# Patient Record
Sex: Female | Born: 1980 | Race: White | Hispanic: No | Marital: Married | Smoking: Never smoker
Health system: Southern US, Community
[De-identification: ages and names within clinical notes are randomized; demographics above are authoritative.]

## PROBLEM LIST (undated history)

## (undated) DIAGNOSIS — Z789 Other specified health status: Secondary | ICD-10-CM

## (undated) HISTORY — PX: WISDOM TOOTH EXTRACTION: SHX21

## (undated) HISTORY — DX: Other specified health status: Z78.9

---

## 2012-05-02 ENCOUNTER — Ambulatory Visit: Payer: BC Managed Care – PPO | Admitting: Gynecology

## 2012-05-02 VITALS — BP 118/64 | Wt 126.0 lb

## 2012-05-02 DIAGNOSIS — Z3400 Encounter for supervision of normal first pregnancy, unspecified trimester: Secondary | ICD-10-CM

## 2012-05-02 LAB — POCT URINE PREGNANCY: Preg Test, Ur: POSITIVE

## 2012-05-03 LAB — OBSTETRIC PANEL
Antibody Screen: NEGATIVE
Basophils Absolute: 0 10*3/uL (ref 0.0–0.1)
Basophils Relative: 1 % (ref 0–1)
HCT: 37.2 % (ref 36.0–46.0)
MCHC: 33.9 g/dL (ref 30.0–36.0)
Monocytes Absolute: 0.5 10*3/uL (ref 0.1–1.0)
Neutro Abs: 4.5 10*3/uL (ref 1.7–7.7)
Neutrophils Relative %: 64 % (ref 43–77)
RDW: 12.2 % (ref 11.5–15.5)

## 2012-05-03 LAB — HIV ANTIBODY (ROUTINE TESTING W REFLEX): HIV: NONREACTIVE

## 2012-05-04 LAB — CULTURE, URINE COMPREHENSIVE
Colony Count: NO GROWTH
Organism ID, Bacteria: NO GROWTH

## 2012-05-17 ENCOUNTER — Encounter: Payer: Self-pay | Admitting: Family Medicine

## 2012-05-17 ENCOUNTER — Ambulatory Visit (INDEPENDENT_AMBULATORY_CARE_PROVIDER_SITE_OTHER): Payer: BC Managed Care – PPO | Admitting: Family Medicine

## 2012-05-17 VITALS — BP 113/60 | Ht 66.0 in | Wt 129.0 lb

## 2012-05-17 DIAGNOSIS — Z1151 Encounter for screening for human papillomavirus (HPV): Secondary | ICD-10-CM

## 2012-05-17 DIAGNOSIS — Z23 Encounter for immunization: Secondary | ICD-10-CM

## 2012-05-17 DIAGNOSIS — Z113 Encounter for screening for infections with a predominantly sexual mode of transmission: Secondary | ICD-10-CM

## 2012-05-17 DIAGNOSIS — Z3400 Encounter for supervision of normal first pregnancy, unspecified trimester: Secondary | ICD-10-CM

## 2012-05-17 DIAGNOSIS — Z124 Encounter for screening for malignant neoplasm of cervix: Secondary | ICD-10-CM

## 2012-05-17 MED ORDER — INFLUENZA VIRUS VACC SPLIT PF IM SUSP: 0.5000 mL | Freq: Once | INTRAMUSCULAR | Status: DC

## 2012-05-17 NOTE — Patient Instructions (Signed)
Pregnancy - First Trimester During sexual intercourse, millions of sperm go into the vagina. Only 1 sperm will penetrate and fertilize the female egg while it is in the Fallopian tube. One week later, the fertilized egg implants into the wall of the uterus. An embryo begins to develop into a baby. At 6 to 8 weeks, the eyes and face are formed and the heartbeat can be seen on ultrasound. At the end of 12 weeks (first trimester), all the baby's organs are formed. Now that you are pregnant, you will want to do everything you can to have a healthy baby. Two of the most important things are to get good prenatal care and follow your caregiver's instructions. Prenatal care is all the medical care you receive before the baby's birth. It is given to prevent, find, and treat problems during the pregnancy and childbirth. PRENATAL EXAMS  During prenatal visits, your weight, blood pressure and urine are checked. This is done to make sure you are healthy and progressing normally during the pregnancy.  A pregnant woman should gain 25 to 35 pounds during the pregnancy. However, if you are over weight or underweight, your caregiver will advise you regarding your weight.  Your caregiver will ask and answer questions for you.  Blood work, cervical cultures, other necessary tests and a Pap test are done during your prenatal exams. These tests are done to check on your health and the probable health of your baby. Tests are strongly recommended and done for HIV with your permission. This is the virus that causes AIDS. These tests are done because medications can be given to help prevent your baby from being born with this infection should you have been infected without knowing it. Blood work is also used to find out your blood type, previous infections and follow your blood levels (hemoglobin).  Low hemoglobin (anemia) is common during pregnancy. Iron and vitamins are given to help prevent this. Later in the pregnancy,  blood tests for diabetes will be done along with any other tests if any problems develop. You may need tests to make sure you and the baby are doing well.  You may need other tests to make sure you and the baby are doing well. CHANGES DURING THE FIRST TRIMESTER (THE FIRST 3 MONTHS OF PREGNANCY) Your body goes through many changes during pregnancy. They vary from person to person. Talk to your caregiver about changes you notice and are concerned about. Changes can include:  Your menstrual period stops.  The egg and sperm carry the genes that determine what you look like. Genes from you and your partner are forming a baby. The female genes determine whether the baby is a boy or a girl.  Your body increases in girth and you may feel bloated.  Feeling sick to your stomach (nauseous) and throwing up (vomiting). If the vomiting is uncontrollable, call your caregiver.  Your breasts will begin to enlarge and become tender.  Your nipples may stick out more and become darker.  The need to urinate more. Painful urination may mean you have a bladder infection.  Tiring easily.  Loss of appetite.  Cravings for certain kinds of food.  At first, you may gain or lose a couple of pounds.  You may have changes in your emotions from day to day (excited to be pregnant or concerned something may go wrong with the pregnancy and baby).  You may have more vivid and strange dreams. HOME CARE INSTRUCTIONS   It is very important   to avoid all smoking, alcohol and un-prescribed drugs during your pregnancy. These affect the formation and growth of the baby. Avoid chemicals while pregnant to ensure the delivery of a healthy infant.  Start your prenatal visits by the 12th week of pregnancy. They are usually scheduled monthly at first, then more often in the last 2 months before delivery. Keep your caregiver's appointments. Follow your caregiver's instructions regarding medication use, blood and lab tests, exercise,  and diet.  During pregnancy, you are providing food for you and your baby. Eat regular, well-balanced meals. Choose foods such as meat, fish, milk and other low fat dairy products, vegetables, fruits, and whole-grain breads and cereals. Your caregiver will tell you of the ideal weight gain.  You can help morning sickness by keeping soda crackers at the bedside. Eat a couple before arising in the morning. You may want to use the crackers without salt on them.  Eating 4 to 5 small meals rather than 3 large meals a day also may help the nausea and vomiting.  Drinking liquids between meals instead of during meals also seems to help nausea and vomiting.  A physical sexual relationship may be continued throughout pregnancy if there are no other problems. Problems may be early (premature) leaking of amniotic fluid from the membranes, vaginal bleeding, or belly (abdominal) pain.  Exercise regularly if there are no restrictions. Check with your caregiver or physical therapist if you are unsure of the safety of some of your exercises. Greater weight gain will occur in the last 2 trimesters of pregnancy. Exercising will help:  Control your weight.  Keep you in shape.  Prepare you for labor and delivery.  Help you lose your pregnancy weight after you deliver your baby.  Wear a good support or jogging bra for breast tenderness during pregnancy. This may help if worn during sleep too.  Ask when prenatal classes are available. Begin classes when they are offered.  Do not use hot tubs, steam rooms or saunas.  Wear your seat belt when driving. This protects you and your baby if you are in an accident.  Avoid raw meat, uncooked cheese, cat litter boxes and soil used by cats throughout the pregnancy. These carry germs that can cause birth defects in the baby.  The first trimester is a good time to visit your dentist for your dental health. Getting your teeth cleaned is OK. Use a softer toothbrush and  brush gently during pregnancy.  Ask for help if you have financial, counseling or nutritional needs during pregnancy. Your caregiver will be able to offer counseling for these needs as well as refer you for other special needs.  Do not take any medications or herbs unless told by your caregiver.  Inform your caregiver if there is any mental or physical domestic violence.  Make a list of emergency phone numbers of family, friends, hospital, and police and fire departments.  Write down your questions. Take them to your prenatal visit.  Do not douche.  Do not cross your legs.  If you have to stand for long periods of time, rotate you feet or take small steps in a circle.  You may have more vaginal secretions that may require a sanitary pad. Do not use tampons or scented sanitary pads. MEDICATIONS AND DRUG USE IN PREGNANCY  Take prenatal vitamins as directed. The vitamin should contain 1 milligram of folic acid. Keep all vitamins out of reach of children. Only a couple vitamins or tablets containing iron may be   fatal to a baby or young child when ingested.  Avoid use of all medications, including herbs, over-the-counter medications, not prescribed or suggested by your caregiver. Only take over-the-counter or prescription medicines for pain, discomfort, or fever as directed by your caregiver. Do not use aspirin, ibuprofen, or naproxen unless directed by your caregiver.  Let your caregiver also know about herbs you may be using.  Alcohol is related to a number of birth defects. This includes fetal alcohol syndrome. All alcohol, in any form, should be avoided completely. Smoking will cause low birth rate and premature babies.  Street or illegal drugs are very harmful to the baby. They are absolutely forbidden. A baby born to an addicted mother will be addicted at birth. The baby will go through the same withdrawal an adult does.  Let your caregiver know about any medications that you have to  take and for what reason you take them. MISCARRIAGE IS COMMON DURING PREGNANCY A miscarriage does not mean you did something wrong. It is not a reason to worry about getting pregnant again. Your caregiver will help you with questions you may have. If you have a miscarriage, you may need minor surgery. SEEK MEDICAL CARE IF:  You have any concerns or worries during your pregnancy. It is better to call with your questions if you feel they cannot wait, rather than worry about them. SEEK IMMEDIATE MEDICAL CARE IF:   An unexplained oral temperature above 102 F (38.9 C) develops, or as your caregiver suggests.  You have leaking of fluid from the vagina (birth canal). If leaking membranes are suspected, take your temperature and inform your caregiver of this when you call.  There is vaginal spotting or bleeding. Notify your caregiver of the amount and how many pads are used.  You develop a bad smelling vaginal discharge with a change in the color.  You continue to feel sick to your stomach (nauseated) and have no relief from remedies suggested. You vomit blood or coffee ground-like materials.  You lose more than 2 pounds of weight in 1 week.  You gain more than 2 pounds of weight in 1 week and you notice swelling of your face, hands, feet, or legs.  You gain 5 pounds or more in 1 week (even if you do not have swelling of your hands, face, legs, or feet).  You get exposed to German measles and have never had them.  You are exposed to fifth disease or chickenpox.  You develop belly (abdominal) pain. Round ligament discomfort is a common non-cancerous (benign) cause of abdominal pain in pregnancy. Your caregiver still must evaluate this.  You develop headache, fever, diarrhea, pain with urination, or shortness of breath.  You fall or are in a car accident or have any kind of trauma.  There is mental or physical violence in your home. Document Released: 05/03/2001 Document Revised: 08/01/2011  Document Reviewed: 11/04/2008 [employer] Patient Information 2013 [employer].  Breastfeeding Deciding to breastfeed is one of the best choices you can make for you and your baby. The information that follows gives a brief overview of the benefits of breastfeeding as well as common topics surrounding breastfeeding. BENEFITS OF BREASTFEEDING For the baby  The first milk (colostrum) helps the baby's digestive system function better.   There are antibodies in the mother's milk that help the baby fight off infections.   The baby has a lower incidence of asthma, allergies, and sudden infant death syndrome (SIDS).   The nutrients in breast milk   are better for the baby than infant formulas, and breast milk helps the baby's brain grow better.   Babies who breastfeed have less gas, colic, and constipation.  For the mother  Breastfeeding helps develop a very special bond between the mother and her baby.   Breastfeeding is convenient, always available at the correct temperature, and costs nothing.   Breastfeeding burns calories in the mother and helps her lose weight that was gained during pregnancy.   Breastfeeding makes the uterus contract back down to normal size faster and slows bleeding following delivery.   Breastfeeding mothers have a lower risk of developing breast cancer.  BREASTFEEDING FREQUENCY  A healthy, full-term baby may breastfeed as often as every hour or space his or her feedings to every 3 hours.   Watch your baby for signs of hunger. Nurse your baby if he or she shows signs of hunger. How often you nurse will vary from baby to baby.   Nurse as often as the baby requests, or when you feel the need to reduce the fullness of your breasts.   Awaken the baby if it has been 3 4 hours since the last feeding.   Frequent feeding will help the mother make more milk and will help prevent problems, such as sore nipples and engorgement of the breasts.  BABY'S  POSITION AT THE BREAST  Whether lying down or sitting, be sure that the baby's tummy is facing your tummy.   Support the breast with 4 fingers underneath the breast and the thumb above. Make sure your fingers are well away from the nipple and baby's mouth.   Stroke the baby's lips gently with your finger or nipple.   When the baby's mouth is open wide enough, place all of your nipple and as much of the areola as possible into your baby's mouth.   Pull the baby in close so the tip of the nose and the baby's cheeks touch the breast during the feeding.  FEEDINGS AND SUCTION  The length of each feeding varies from baby to baby and from feeding to feeding.   The baby must suck about 2 3 minutes for your milk to get to him or her. This is called a "let down." For this reason, allow the baby to feed on each breast as long as he or she wants. Your baby will end the feeding when he or she has received the right balance of nutrients.   To break the suction, put your finger into the corner of the baby's mouth and slide it between his or her gums before removing your breast from his or her mouth. This will help prevent sore nipples.  HOW TO TELL WHETHER YOUR BABY IS GETTING ENOUGH BREAST MILK. Wondering whether or not your baby is getting enough milk is a common concern among mothers. You can be assured that your baby is getting enough milk if:   Your baby is actively sucking and you hear swallowing.   Your baby seems relaxed and satisfied after a feeding.   Your baby nurses at least 8 12 times in a 24 hour time period. Nurse your baby until he or she unlatches or falls asleep at the first breast (at least 10 20 minutes), then offer the second side.   Your baby is wetting 5 6 disposable diapers (6 8 cloth diapers) in a 24 hour period by 5 6 days of age.   Your baby is having at least 3 4 stools every 24 hours   for the first 6 weeks. The stool should be soft and yellow.   Your baby  should gain 4 7 ounces per week after he or she is 4 days old.   Your breasts feel softer after nursing.  REDUCING BREAST ENGORGEMENT  In the first week after your baby is born, you may experience signs of breast engorgement. When breasts are engorged, they feel heavy, warm, full, and may be tender to the touch. You can reduce engorgement if you:   Nurse frequently, every 2 3 hours. Mothers who breastfeed early and often have fewer problems with engorgement.   Place light ice packs on your breasts for 10 20 minutes between feedings. This reduces swelling. Wrap the ice packs in a lightweight towel to protect your skin. Bags of frozen vegetables work well for this purpose.   Take a warm shower or apply warm, moist heat to your breast for 5 10 minutes just before each feeding. This increases circulation and helps the milk flow.   Gently massage your breast before and during the feeding. Using your finger tips, massage from the chest wall towards your nipple in a circular motion.   Make sure that the baby empties at least one breast at every feeding before switching sides.   Use a breast pump to empty the breasts if your baby is sleepy or not nursing well. You may also want to pump if you are returning to work oryou feel you are getting engorged.   Avoid bottle feeds, pacifiers, or supplemental feedings of water or juice in place of breastfeeding. Breast milk is all the food your baby needs. It is not necessary for your baby to have water or formula. In fact, to help your breasts make more milk, it is best not to give your baby supplemental feedings during the early weeks.   Be sure the baby is latched on and positioned properly while breastfeeding.   Wear a supportive bra, avoiding underwire styles.   Eat a balanced diet with enough fluids.   Rest often, relax, and take your prenatal vitamins to prevent fatigue, stress, and anemia.  If you follow these suggestions, your  engorgement should improve in 24 48 hours. If you are still experiencing difficulty, call your lactation consultant or caregiver.  CARING FOR YOURSELF Take care of your breasts  Bathe or shower daily.   Avoid using soap on your nipples.   Start feedings on your left breast at one feeding and on your right breast at the next feeding.   You will notice an increase in your milk supply 2 5 days after delivery. You may feel some discomfort from engorgement, which makes your breasts very firm and often tender. Engorgement "peaks" out within 24 48 hours. In the meantime, apply warm moist towels to your breasts for 5 10 minutes before feeding. Gentle massage and expression of some milk before feeding will soften your breasts, making it easier for your baby to latch on.   Wear a well-fitting nursing bra, and air dry your nipples for a 3 4minutes after each feeding.   Only use cotton bra pads.   Only use pure lanolin on your nipples after nursing. You do not need to wash it off before feeding the baby again. Another option is to express a few drops of breast milk and gently massage it into your nipples.  Take care of yourself  Eat well-balanced meals and nutritious snacks.   Drinking milk, fruit juice, and water to satisfy your thirst (  about 8 glasses a day).   Get plenty of rest.  Avoid foods that you notice affect the baby in a bad way.  SEEK MEDICAL CARE IF:   You have difficulty with breastfeeding and need help.   You have a hard, red, sore area on your breast that is accompanied by a fever.   Your baby is too sleepy to eat well or is having trouble sleeping.   Your baby is wetting less than 6 diapers a day, by 5 days of age.   Your baby's skin or white part of his or her eyes is more yellow than it was in the hospital.   You feel depressed.  Document Released: 05/09/2005 Document Revised: 11/08/2011 Document Reviewed: 08/07/2011 [employer] Patient Information 2013  [employer].  

## 2012-05-17 NOTE — Progress Notes (Signed)
   Subjective:    Enya Bureau is a G1P0 [redacted]w[redacted]d being seen today for her first obstetrical visit.  Her obstetrical history is significant for no pertinent history.. Patient does intend to breast feed. Pregnancy history fully reviewed.  Patient reports nausea.  Filed Vitals:   05/17/12 0841 05/17/12 0843  BP: 113/60   Height:  5\' 6"  (1.676 m)  Weight: 129 lb (58.514 kg)     HISTORY: OB History    Grav Para Term Preterm Abortions TAB SAB Ect Mult Living   1              # Outc Date GA Lbr Len/2nd Wgt Sex Del Anes PTL Lv   1 CUR              History reviewed. No pertinent past medical history. Past Surgical History  Procedure Date  . Wisdom tooth extraction     x3   Family History  Problem Relation Age of Onset  . Heart disease Father     quad bypass surgery  . Cancer Paternal Aunt     pancreatic  . Cancer Maternal Grandmother     breast  . Cancer Maternal Grandfather     lung  . Cancer Paternal Grandmother     breast  . Cancer Paternal Grandfather   . Cancer Paternal Aunt      Exam    Uterus:     Pelvic Exam:    Perineum: Normal Perineum   Vulva: Bartholin's, Urethra, Skene's normal   Vagina:  normal mucosa       Cervix: no cervical motion tenderness, no lesions and nulliparous appearance   Adnexa: normal adnexa and no mass, fullness, tenderness   Bony Pelvis: average  System: Breast:  normal appearance, no masses or tenderness   Skin: normal coloration and turgor, no rashes    Neurologic: oriented   Extremities: normal strength, tone, and muscle mass   HEENT sclera clear, anicteric   Mouth/Teeth mucous membranes moist, pharynx normal without lesions   Neck supple   Cardiovascular: regular rate and rhythm, no murmurs or gallops   Respiratory:  appears well, vitals normal, no respiratory distress, acyanotic, normal RR, ear and throat exam is normal, neck free of mass or lymphadenopathy, chest clear, no wheezing, crepitations, rhonchi, normal symmetric  air entry   Abdomen: soft, non-tender; bowel sounds normal; no masses,  no organomegaly       TVUS with monitor broken--small screen shows single IUP + flicker.   Assessment:    Pregnancy: G1P0 Patient Active Problem List  Diagnosis  . Supervision of normal first pregnancy        Plan:     Initial labs drawn. Prenatal vitamins. Problem list reviewed and updated. Genetic Screening discussed First Screen: declined.  Ultrasound discussed; fetal survey: discussed.  Follow up in 4 weeks.  Logistics of  [facility] discussed.   PRATT,TANYA S 05/17/2012

## 2012-06-15 ENCOUNTER — Encounter: Payer: Self-pay | Admitting: Family Medicine

## 2012-06-15 ENCOUNTER — Encounter: Payer: BC Managed Care – PPO | Admitting: Family Medicine

## 2012-06-15 ENCOUNTER — Ambulatory Visit (INDEPENDENT_AMBULATORY_CARE_PROVIDER_SITE_OTHER): Payer: BC Managed Care – PPO | Admitting: Family Medicine

## 2012-06-15 ENCOUNTER — Ambulatory Visit (HOSPITAL_COMMUNITY)
Admission: RE | Admit: 2012-06-15 | Discharge: 2012-06-15 | Disposition: A | Discharge status: Home / Self Care | Payer: BC Managed Care – PPO | Source: Ambulatory Visit | Attending: Family Medicine | Admitting: Family Medicine

## 2012-06-15 VITALS — BP 115/60 | Wt 131.0 lb

## 2012-06-15 DIAGNOSIS — O200 Threatened abortion: Secondary | ICD-10-CM | POA: Insufficient documentation

## 2012-06-15 DIAGNOSIS — O209 Hemorrhage in early pregnancy, unspecified: Secondary | ICD-10-CM | POA: Insufficient documentation

## 2012-06-15 DIAGNOSIS — Z3480 Encounter for supervision of other normal pregnancy, unspecified trimester: Secondary | ICD-10-CM

## 2012-06-15 DIAGNOSIS — O26859 Spotting complicating pregnancy, unspecified trimester: Secondary | ICD-10-CM

## 2012-06-15 NOTE — Patient Instructions (Signed)
Threatened Miscarriage Bleeding during the first 20 weeks of pregnancy is common. This is sometimes called a threatened miscarriage. This is a pregnancy that is threatening to end before the twentieth week of pregnancy. Often this bleeding stops with bed rest or decreased activities as suggested by your caregiver and the pregnancy continues without any more problems. You may be asked to not have sexual intercourse, have orgasms or use tampons until further notice. Sometimes a threatened miscarriage can progress to a complete or incomplete miscarriage. This may or may not require further treatment. Some miscarriages occur before a woman misses a menstrual period and knows she is pregnant. Miscarriages occur in 43 to 20% of all pregnancies and usually occur during the first 13 weeks of the pregnancy. The exact cause of a miscarriage is usually never known. A miscarriage is natures way of ending a pregnancy that is abnormal or would not make it to term. There are some things that may put you at risk to have a miscarriage, such as:  Hormone problems.  Infection of the uterus or cervix.  Chronic illness, diabetes for example, especially if it is not controlled.  Abnormal shaped uterus.  Fibroids in the uterus.  Incompetent cervix (the cervix is too weak to hold the baby).  Smoking.  Drinking too much alcohol. It's best not to drink any alcohol when you are pregnant.  Taking illegal drugs. TREATMENT  When a miscarriage becomes complete and all products of conception (all the tissue in the uterus) have been passed, often no treatment is needed. If you think you passed tissue, save it in a container and take it to your doctor for evaluation. If the miscarriage is incomplete (parts of the fetus or placenta remain in the uterus), further treatment may be needed. The most common reason for further treatment is continued bleeding (hemorrhage) because pregnancy tissue did not pass out of the uterus. This  often occurs if a miscarriage is incomplete. Tissue left behind may also become infected. Treatment usually is dilatation and curettage (the removal of the remaining products of pregnancy. This can be done by a simple sucking procedure (suction curettage) or a simple scraping of the inside of the uterus. This may be done in the hospital or in the caregiver's office. This is only done when your caregiver knows that there is no chance for the pregnancy to proceed to term. This is determined by physical examination, negative pregnancy test, falling pregnancy hormone count and/or, an ultrasound revealing a dead fetus. Miscarriages are often a very emotional time for prospective mothers and fathers. This is not you or your partners fault. It did not occur because of an inadequacy in you or your partner. Nearly all miscarriages occur because the pregnancy has started off wrongly. At least half of these pregnancies have a chromosomal abnormality. It is almost always not inherited. Others may have developmental problems with the fetus or placenta. This does not always show up even when the products miscarried are studied under the microscope. The miscarriage is nearly always not your fault and it is not likely that you could have prevented it from happening. If you are having emotional and grieving problems, talk to your health care provider and even seek counseling, if necessary, before getting pregnant again. You can begin trying for another pregnancy as soon as your caregiver says it is OK. HOME CARE INSTRUCTIONS   Your caregiver may order bed rest depending on how much bleeding and cramping you are having. You may be limited  caregiver may order bed rest depending on how much bleeding and cramping you are having. You may be limited to only getting up to go to the bathroom. You may be allowed to continue light activity. You may need to make arrangements for the care of your other children and for any other responsibilities.   Keep track of the number of pads you use each day, how often you have to change pads and how saturated  (soaked) they are. Record this information.   DO NOT USE TAMPONS. Do not douche, have sexual intercourse or orgasms until approved by your caregiver.   You may receive a follow up appointment for re-evaluation of your pregnancy and a repeat blood test. Re-evaluation often occurs after 2 days and again in 4 to 6 weeks. It is very important that you follow-up in the recommended time period.   If you are Rh negative and the father is Rh positive or you do not know the fathers' blood type, you may receive a shot (Rh immune globulin) to help prevent abnormal antibodies that can develop and affect the baby in any future pregnancies.  SEEK IMMEDIATE MEDICAL CARE IF:   You have severe cramps in your stomach, back, or abdomen.   You have a sudden onset of severe pain in the lower part of your abdomen.   You develop chills.   You run an unexplained temperature of 101 F (38.3 C) or higher.   You pass large clots or tissue. Save any tissue for your caregiver to inspect.   Your bleeding increases or you become light-headed, weak, or have fainting episodes.   You have a gush of fluid from your vagina.   You pass out. This could mean you have a tubal (ectopic) pregnancy.  Document Released: 05/09/2005 Document Revised: 08/01/2011 Document Reviewed: 12/24/2007   [employer] Patient Information 2013 [employer]

## 2012-06-15 NOTE — Progress Notes (Signed)
Had some cramping and spotting. Has nml heartbeat today U/S shows subchorionic hemorrhage.  Called and emailed result to pt.

## 2012-06-19 ENCOUNTER — Encounter: Payer: BC Managed Care – PPO | Admitting: Obstetrics & Gynecology

## 2012-07-09 ENCOUNTER — Ambulatory Visit (INDEPENDENT_AMBULATORY_CARE_PROVIDER_SITE_OTHER): Payer: BC Managed Care – PPO | Admitting: Obstetrics & Gynecology

## 2012-07-09 ENCOUNTER — Encounter: Payer: Self-pay | Admitting: Obstetrics & Gynecology

## 2012-07-09 VITALS — BP 119/64 | Wt 135.0 lb

## 2012-07-09 DIAGNOSIS — Z3400 Encounter for supervision of normal first pregnancy, unspecified trimester: Secondary | ICD-10-CM

## 2012-07-09 NOTE — Progress Notes (Signed)
Bleeding had stopped until yesterday, having small amount of cramping otherwise doing well.

## 2012-07-09 NOTE — Progress Notes (Signed)
Routine visit. She stopped vaginal spotting on 06/15/11 and had another occasion of spotting yesterday and today. Her cervix appears normal today with a speculum exam. Reassurance given.

## 2012-07-10 ENCOUNTER — Encounter: Payer: BC Managed Care – PPO | Admitting: Family Medicine

## 2012-08-06 ENCOUNTER — Encounter: Payer: BC Managed Care – PPO | Admitting: Obstetrics & Gynecology

## 2012-08-13 ENCOUNTER — Ambulatory Visit (INDEPENDENT_AMBULATORY_CARE_PROVIDER_SITE_OTHER): Payer: BC Managed Care – PPO | Admitting: Obstetrics & Gynecology

## 2012-08-13 VITALS — BP 126/64 | Wt 141.0 lb

## 2012-08-13 DIAGNOSIS — Z3402 Encounter for supervision of normal first pregnancy, second trimester: Secondary | ICD-10-CM

## 2012-08-13 DIAGNOSIS — Z3400 Encounter for supervision of normal first pregnancy, unspecified trimester: Secondary | ICD-10-CM

## 2012-08-13 NOTE — Progress Notes (Signed)
Routine visit. Good FM. Her anatomy ultrasound is tomorrow. She has had her flu vaccine. She denies any further bleeding.

## 2012-08-14 ENCOUNTER — Ambulatory Visit (HOSPITAL_COMMUNITY)
Admission: RE | Admit: 2012-08-14 | Discharge: 2012-08-14 | Disposition: A | Discharge status: Home / Self Care | Payer: BC Managed Care – PPO | Source: Ambulatory Visit | Attending: Obstetrics & Gynecology | Admitting: Obstetrics & Gynecology

## 2012-08-14 DIAGNOSIS — Z363 Encounter for antenatal screening for malformations: Secondary | ICD-10-CM | POA: Insufficient documentation

## 2012-08-14 DIAGNOSIS — Z1389 Encounter for screening for other disorder: Secondary | ICD-10-CM | POA: Insufficient documentation

## 2012-08-14 DIAGNOSIS — Z3400 Encounter for supervision of normal first pregnancy, unspecified trimester: Secondary | ICD-10-CM

## 2012-08-14 DIAGNOSIS — O358XX0 Maternal care for other (suspected) fetal abnormality and damage, not applicable or unspecified: Secondary | ICD-10-CM | POA: Insufficient documentation

## 2012-08-15 ENCOUNTER — Encounter: Payer: Self-pay | Admitting: Obstetrics & Gynecology

## 2012-09-10 ENCOUNTER — Encounter: Payer: Self-pay | Admitting: *Deleted

## 2012-09-10 ENCOUNTER — Ambulatory Visit (INDEPENDENT_AMBULATORY_CARE_PROVIDER_SITE_OTHER): Payer: BC Managed Care – PPO | Admitting: Obstetrics & Gynecology

## 2012-09-10 VITALS — BP 116/64 | Wt 147.0 lb

## 2012-09-10 DIAGNOSIS — O358XX0 Maternal care for other (suspected) fetal abnormality and damage, not applicable or unspecified: Secondary | ICD-10-CM | POA: Insufficient documentation

## 2012-09-10 DIAGNOSIS — O289 Unspecified abnormal findings on antenatal screening of mother: Secondary | ICD-10-CM

## 2012-09-10 DIAGNOSIS — Z3400 Encounter for supervision of normal first pregnancy, unspecified trimester: Secondary | ICD-10-CM

## 2012-09-10 NOTE — Progress Notes (Signed)
P= 63 

## 2012-09-10 NOTE — Progress Notes (Signed)
Routine visit. Good FM. No problems, no VB, ROM, or CTXs. Glucola, labs, and TDAP at next visit.

## 2012-09-28 ENCOUNTER — Ambulatory Visit (INDEPENDENT_AMBULATORY_CARE_PROVIDER_SITE_OTHER): Payer: BC Managed Care – PPO | Admitting: Obstetrics & Gynecology

## 2012-09-28 ENCOUNTER — Encounter: Payer: Self-pay | Admitting: Obstetrics & Gynecology

## 2012-09-28 VITALS — BP 137/69 | Wt 149.0 lb

## 2012-09-28 DIAGNOSIS — Z23 Encounter for immunization: Secondary | ICD-10-CM

## 2012-09-28 DIAGNOSIS — Z3402 Encounter for supervision of normal first pregnancy, second trimester: Secondary | ICD-10-CM

## 2012-09-28 DIAGNOSIS — Z3400 Encounter for supervision of normal first pregnancy, unspecified trimester: Secondary | ICD-10-CM

## 2012-09-28 LAB — CBC
MCV: 93.5 fL (ref 78.0–100.0)
Platelets: 264 10*3/uL (ref 150–400)
RBC: 3.55 MIL/uL — ABNORMAL LOW (ref 3.87–5.11)
RDW: 13.3 % (ref 11.5–15.5)
WBC: 11.9 10*3/uL — ABNORMAL HIGH (ref 4.0–10.5)

## 2012-09-28 NOTE — Progress Notes (Signed)
Routine visit. Good FM. No OB problems. TDAP, glucola, and labs today.

## 2012-09-29 LAB — GLUCOSE TOLERANCE, 1 HOUR (50G) W/O FASTING: Glucose, 1 Hour GTT: 92 mg/dL (ref 70–140)

## 2012-09-29 LAB — RPR

## 2012-10-08 ENCOUNTER — Encounter: Payer: Self-pay | Admitting: Obstetrics & Gynecology

## 2012-10-22 ENCOUNTER — Encounter: Payer: Self-pay | Admitting: Obstetrics & Gynecology

## 2012-10-22 ENCOUNTER — Ambulatory Visit (INDEPENDENT_AMBULATORY_CARE_PROVIDER_SITE_OTHER): Payer: BC Managed Care – PPO | Admitting: Obstetrics & Gynecology

## 2012-10-22 VITALS — BP 120/62 | Wt 153.0 lb

## 2012-10-22 DIAGNOSIS — Z3402 Encounter for supervision of normal first pregnancy, second trimester: Secondary | ICD-10-CM

## 2012-10-22 DIAGNOSIS — Z3400 Encounter for supervision of normal first pregnancy, unspecified trimester: Secondary | ICD-10-CM

## 2012-10-22 NOTE — Progress Notes (Signed)
Routine visit. Good FM. No VB, ROM, CTXs. No concerns or questions.

## 2012-11-05 ENCOUNTER — Encounter: Payer: Self-pay | Admitting: Obstetrics and Gynecology

## 2012-11-05 ENCOUNTER — Ambulatory Visit (INDEPENDENT_AMBULATORY_CARE_PROVIDER_SITE_OTHER): Payer: BC Managed Care – PPO | Admitting: Obstetrics and Gynecology

## 2012-11-05 VITALS — BP 135/92 | Wt 154.0 lb

## 2012-11-05 DIAGNOSIS — Z3403 Encounter for supervision of normal first pregnancy, third trimester: Secondary | ICD-10-CM

## 2012-11-05 DIAGNOSIS — O289 Unspecified abnormal findings on antenatal screening of mother: Secondary | ICD-10-CM

## 2012-11-05 DIAGNOSIS — O358XX0 Maternal care for other (suspected) fetal abnormality and damage, not applicable or unspecified: Secondary | ICD-10-CM

## 2012-11-05 DIAGNOSIS — Z3400 Encounter for supervision of normal first pregnancy, unspecified trimester: Secondary | ICD-10-CM

## 2012-11-05 NOTE — Progress Notes (Signed)
P= 80 

## 2012-11-05 NOTE — Progress Notes (Signed)
Patient doing well without complaints. FM/PTL precautions reviewed. F/U ultrasound for renal pyelectasis ordered

## 2012-11-09 ENCOUNTER — Ambulatory Visit (HOSPITAL_COMMUNITY)
Admission: RE | Admit: 2012-11-09 | Discharge: 2012-11-09 | Disposition: A | Discharge status: Home / Self Care | Payer: BC Managed Care – PPO | Source: Ambulatory Visit | Attending: Obstetrics and Gynecology | Admitting: Obstetrics and Gynecology

## 2012-11-09 DIAGNOSIS — Z363 Encounter for antenatal screening for malformations: Secondary | ICD-10-CM | POA: Insufficient documentation

## 2012-11-09 DIAGNOSIS — Z3403 Encounter for supervision of normal first pregnancy, third trimester: Secondary | ICD-10-CM

## 2012-11-09 DIAGNOSIS — Z1389 Encounter for screening for other disorder: Secondary | ICD-10-CM | POA: Insufficient documentation

## 2012-11-09 DIAGNOSIS — O358XX0 Maternal care for other (suspected) fetal abnormality and damage, not applicable or unspecified: Secondary | ICD-10-CM | POA: Insufficient documentation

## 2012-11-19 ENCOUNTER — Ambulatory Visit (INDEPENDENT_AMBULATORY_CARE_PROVIDER_SITE_OTHER): Payer: BC Managed Care – PPO | Admitting: Family Medicine

## 2012-11-19 ENCOUNTER — Encounter: Payer: Self-pay | Admitting: Family Medicine

## 2012-11-19 VITALS — BP 133/85 | Wt 157.0 lb

## 2012-11-19 DIAGNOSIS — Z3480 Encounter for supervision of other normal pregnancy, unspecified trimester: Secondary | ICD-10-CM

## 2012-11-19 DIAGNOSIS — O289 Unspecified abnormal findings on antenatal screening of mother: Secondary | ICD-10-CM

## 2012-11-19 DIAGNOSIS — O358XX0 Maternal care for other (suspected) fetal abnormality and damage, not applicable or unspecified: Secondary | ICD-10-CM

## 2012-11-19 NOTE — Progress Notes (Signed)
P = 66 

## 2012-11-19 NOTE — Progress Notes (Signed)
Doing well--U/S showed resolved fetal renal pyelectasis.

## 2012-11-19 NOTE — Patient Instructions (Signed)
Pregnancy - Third Trimester The third trimester of pregnancy (the last 3 months) is a period of the most rapid growth for you and your baby. The baby approaches a length of 20 inches and a weight of 6 to 10 pounds. The baby is adding on fat and getting ready for life outside your body. While inside, babies have periods of sleeping and waking, sucking thumbs, and hiccuping. You can often feel small contractions of the uterus. This is false labor. It is also called Braxton-Hicks contractions. This is like a practice for labor. The usual problems in this stage of pregnancy include more difficulty breathing, swelling of the hands and feet from water retention, and having to urinate more often because of the uterus and baby pressing on your bladder.  PRENATAL EXAMS  Blood work may continue to be done during prenatal exams. These tests are done to check on your health and the probable health of your baby. Blood work is used to follow your blood levels (hemoglobin). Anemia (low hemoglobin) is common during pregnancy. Iron and vitamins are given to help prevent this. You may also continue to be checked for diabetes. Some of the past blood tests may be done again.  The size of the uterus is measured during each visit. This makes sure your baby is growing properly according to your pregnancy dates.  Your blood pressure is checked every prenatal visit. This is to make sure you are not getting toxemia.  Your urine is checked every prenatal visit for infection, diabetes, and protein.  Your weight is checked at each visit. This is done to make sure gains are happening at the suggested rate and that you and your baby are growing normally.  Sometimes, an ultrasound is performed to confirm the position and the proper growth and development of the baby. This is a test done that bounces harmless sound waves off the baby so your caregiver can more accurately determine a due date.  Discuss the type of pain medicine and  anesthesia you will have during your labor and delivery.  Discuss the possibility and anesthesia if a cesarean section might be necessary.  Inform your caregiver if there is any mental or physical violence at home. Sometimes, a specialized non-stress test, contraction stress test, and biophysical profile are done to make sure the baby is not having a problem. Checking the amniotic fluid surrounding the baby is called an amniocentesis. The amniotic fluid is removed by sticking a needle into the belly (abdomen). This is sometimes done near the end of pregnancy if an early delivery is required. In this case, it is done to help make sure the baby's lungs are mature enough for the baby to live outside of the womb. If the lungs are not mature and it is unsafe to deliver the baby, an injection of cortisone medicine is given to the mother 1 to 2 days before the delivery. This helps the baby's lungs mature and makes it safer to deliver the baby. CHANGES OCCURING IN THE THIRD TRIMESTER OF PREGNANCY Your body goes through many changes during pregnancy. They vary from person to person. Talk to your caregiver about changes you notice and are concerned about.  During the last trimester, you have probably had an increase in your appetite. It is normal to have cravings for certain foods. This varies from person to person and pregnancy to pregnancy.  You may begin to get stretch marks on your hips, abdomen, and breasts. These are normal changes in the body   during pregnancy. There are no exercises or medicines to take which prevent this change.  Constipation may be treated with a stool softener or adding bulk to your diet. Drinking lots of fluids, fiber in vegetables, fruits, and whole grains are helpful.  Exercising is also helpful. If you have been very active up until your pregnancy, most of these activities can be continued during your pregnancy. If you have been less active, it is helpful to start an exercise  program such as walking. Consult your caregiver before starting exercise programs.  Avoid all smoking, alcohol, non-prescribed drugs, herbs and "street drugs" during your pregnancy. These chemicals affect the formation and growth of the baby. Avoid chemicals throughout the pregnancy to ensure the delivery of a healthy infant.  Backache, varicose veins, and hemorrhoids may develop or get worse.  You will tire more easily in the third trimester, which is normal.  The baby's movements may be stronger and more often.  You may become short of breath easily.  Your belly button may stick out.  A yellow discharge may leak from your breasts called colostrum.  You may have a bloody mucus discharge. This usually occurs a few days to a week before labor begins. HOME CARE INSTRUCTIONS   Keep your caregiver's appointments. Follow your caregiver's instructions regarding medicine use, exercise, and diet.  During pregnancy, you are providing food for you and your baby. Continue to eat regular, well-balanced meals. Choose foods such as meat, fish, milk and other low fat dairy products, vegetables, fruits, and whole-grain breads and cereals. Your caregiver will tell you of the ideal weight gain.  A physical sexual relationship may be continued throughout pregnancy if there are no other problems such as early (premature) leaking of amniotic fluid from the membranes, vaginal bleeding, or belly (abdominal) pain.  Exercise regularly if there are no restrictions. Check with your caregiver if you are unsure of the safety of your exercises. Greater weight gain will occur in the last 2 trimesters of pregnancy. Exercising helps:  Control your weight.  Get you in shape for labor and delivery.  You lose weight after you deliver.  Rest a lot with legs elevated, or as needed for leg cramps or low back pain.  Wear a good support or jogging bra for breast tenderness during pregnancy. This may help if worn during  sleep. Pads or tissues may be used in the bra if you are leaking colostrum.  Do not use hot tubs, steam rooms, or saunas.  Wear your seat belt when driving. This protects you and your baby if you are in an accident.  Avoid raw meat, cat litter boxes and soil used by cats. These carry germs that can cause birth defects in the baby.  It is easier to leak urine during pregnancy. Tightening up and strengthening the pelvic muscles will help with this problem. You can practice stopping your urination while you are going to the bathroom. These are the same muscles you need to strengthen. It is also the muscles you would use if you were trying to stop from passing gas. You can practice tightening these muscles up 10 times a set and repeating this about 3 times per day. Once you know what muscles to tighten up, do not perform these exercises during urination. It is more likely to cause an infection by backing up the urine.  Ask for help if you have financial, counseling, or nutritional needs during pregnancy. Your caregiver will be able to offer counseling for these   needs as well as refer you for other special needs.  Make a list of emergency phone numbers and have them available.  Plan on getting help from family or friends when you go home from the hospital.  Make a trial run to the hospital.  Take prenatal classes with the father to understand, practice, and ask questions about the labor and delivery.  Prepare the baby's room or nursery.  Do not travel out of the city unless it is absolutely necessary and with the advice of your caregiver.  Wear only low or no heal shoes to have better balance and prevent falling. MEDICINES AND DRUG USE IN PREGNANCY  Take prenatal vitamins as directed. The vitamin should contain 1 milligram of folic acid. Keep all vitamins out of reach of children. Only a couple vitamins or tablets containing iron may be fatal to a baby or young child when ingested.  Avoid use  of all medicines, including herbs, over-the-counter medicines, not prescribed or suggested by your caregiver. Only take over-the-counter or prescription medicines for pain, discomfort, or fever as directed by your caregiver. Do not use aspirin, ibuprofen or naproxen unless approved by your caregiver.  Let your caregiver also know about herbs you may be using.  Alcohol is related to a number of birth defects. This includes fetal alcohol syndrome. All alcohol, in any form, should be avoided completely. Smoking will cause low birth rate and premature babies.  Illegal drugs are very harmful to the baby. They are absolutely forbidden. A baby born to an addicted mother will be addicted at birth. The baby will go through the same withdrawal an adult does. SEEK MEDICAL CARE IF: You have any concerns or worries during your pregnancy. It is better to call with your questions if you feel they cannot wait, rather than worry about them. SEEK IMMEDIATE MEDICAL CARE IF:   An unexplained oral temperature above 102 F (38.9 C) develops, or as your caregiver suggests.  You have leaking of fluid from the vagina. If leaking membranes are suspected, take your temperature and tell your caregiver of this when you call.  There is vaginal spotting, bleeding or passing clots. Tell your caregiver of the amount and how many pads are used.  You develop a bad smelling vaginal discharge with a change in the color from clear to white.  You develop vomiting that lasts more than 24 hours.  You develop chills or fever.  You develop shortness of breath.  You develop burning on urination.  You loose more than 2 pounds of weight or gain more than 2 pounds of weight or as suggested by your caregiver.  You notice sudden swelling of your face, hands, and feet or legs.  You develop belly (abdominal) pain. Round ligament discomfort is a common non-cancerous (benign) cause of abdominal pain in pregnancy. Your caregiver still  must evaluate you.  You develop a severe headache that does not go away.  You develop visual problems, blurred or double vision.  If you have not felt your baby move for more than 1 hour. If you think the baby is not moving as much as usual, eat something with sugar in it and lie down on your left side for an hour. The baby should move at least 4 to 5 times per hour. Call right away if your baby moves less than that.  You fall, are in a car accident, or any kind of trauma.  There is mental or physical violence at home. Document Released: 05/03/2001   Document Revised: 02/01/2012 Document Reviewed: 11/05/2008 [employer] Patient Information 2014 [employer].  Breastfeeding A change in hormones during your pregnancy causes growth of your breast tissue and an increase in number and size of milk ducts. The hormone prolactin allows proteins, sugars, and fats from your blood supply to make breast milk in your milk-producing glands. The hormone progesterone prevents breast milk from being released before the birth of your baby. After the birth of your baby, your progesterone level decreases allowing breast milk to be released. Thoughts of your baby, as well as his or her sucking or crying, can stimulate the release of milk from the milk-producing glands. Deciding to breastfeed (nurse) is one of the best choices you can make for you and your baby. The information that follows gives a brief review of the benefits, as well as other important skills to know about breastfeeding. BENEFITS OF BREASTFEEDING For your baby  The first milk (colostrum) helps your baby's digestive system function better.   There are antibodies in your milk that help your baby fight off infections.   Your baby has a lower incidence of asthma, allergies, and sudden infant death syndrome (SIDS).   The nutrients in breast milk are better for your baby than infant formulas.  Breast milk improves your baby's brain development.    Your baby will have less gas, colic, and constipation.  Your baby is less likely to develop other conditions, such as childhood obesity, asthma, or diabetes mellitus. For you  Breastfeeding helps develop a very special bond between you and your baby.   Breastfeeding is convenient, always available at the correct temperature, and costs nothing.   Breastfeeding helps to burn calories and helps you lose the weight gained during pregnancy.   Breastfeeding makes your uterus contract back down to normal size faster and slows bleeding following delivery.   Breastfeeding mothers have a lower risk of developing osteoporosis or breast or ovarian cancer later in life.  BREASTFEEDING FREQUENCY  A healthy, full-term baby may breastfeed as often as every hour or space his or her feedings to every 3 hours. Breastfeeding frequency will vary from baby to baby.   Newborns should be fed no less than every 2 3 hours during the day and every 4 5 hours during the night. You should breastfeed a minimum of 8 feedings in a 24 hour period.  Awaken your baby to breastfeed if it has been 3 4 hours since the last feeding.  Breastfeed when you feel the need to reduce the fullness of your breasts or when your newborn shows signs of hunger. Signs that your baby may be hungry include:  Increased alertness or activity.  Stretching.  Movement of the head from side to side.  Movement of the head and opening of the mouth when the corner of the mouth or cheek is stroked (rooting).  Increased sucking sounds, smacking lips, cooing, sighing, or squeaking.  Hand-to-mouth movements.  Increased sucking of fingers or hands.  Fussing.  Intermittent crying.  Signs of extreme hunger will require calming and consoling before you try to feed your baby. Signs of extreme hunger may include:  Restlessness.  A loud, strong cry.  Screaming.  Frequent feeding will help you make more milk and will help prevent  problems, such as sore nipples and engorgement of the breasts.  BREASTFEEDING   Whether lying down or sitting, be sure that the baby's abdomen is facing your abdomen.   Support your breast with 4 fingers under your breast   and your thumb above your nipple. Make sure your fingers are well away from your nipple and your baby's mouth.   Stroke your baby's lips gently with your finger or nipple.   When your baby's mouth is open wide enough, place all of your nipple and as much of the colored area around your nipple (areola) as possible into your baby's mouth.  More areola should be visible above his or her upper lip than below his or her lower lip.  Your baby's tongue should be between his or her lower gum and your breast.  Ensure that your baby's mouth is correctly positioned around the nipple (latched). Your baby's lips should create a seal on your breast.  Signs that your baby has effectively latched onto your nipple include:  Tugging or sucking without pain.  Swallowing heard between sucks.  Absent click or smacking sound.  Muscle movement above and in front of his or her ears with sucking.  Your baby must suck about 2 3 minutes in order to get your milk. Allow your baby to feed on each breast as long as he or she wants. Nurse your baby until he or she unlatches or falls asleep at the first breast, then offer the second breast.  Signs that your baby is full and satisfied include:  A gradual decrease in the number of sucks or complete cessation of sucking.  Falling asleep.  Extension or relaxation of his or her body.  Retention of a small amount of milk in his or her mouth.  Letting go of your breast by himself or herself.  Signs of effective breastfeeding in you include:  Breasts that have increased firmness, weight, and size prior to feeding.  Breasts that are softer after nursing.  Increased milk volume, as well as a change in milk consistency and color by the 5th  day of breastfeeding.  Breast fullness relieved by breastfeeding.  Nipples are not sore, cracked, or bleeding.  If needed, break the suction by putting your finger into the corner of your baby's mouth and sliding your finger between his or her gums. Then, remove your breast from his or her mouth.  It is common for babies to spit up a small amount after a feeding.  Babies often swallow air during feeding. This can make babies fussy. Burping your baby between breasts can help with this.  Vitamin D supplements are recommended for babies who get only breast milk.  Avoid using a pacifier during your baby's first 4 6 weeks.  Avoid supplemental feedings of water, formula, or juice in place of breastfeeding. Breast milk is all the food your baby needs. It is not necessary for your baby to have water or formula. Your breasts will make more milk if supplemental feedings are avoided during the early weeks. HOW TO TELL WHETHER YOUR BABY IS GETTING ENOUGH BREAST MILK Wondering whether or not your baby is getting enough milk is a common concern among mothers. You can be assured that your baby is getting enough milk if:   Your baby is actively sucking and you hear swallowing.   Your baby seems relaxed and satisfied after a feeding.   Your baby nurses at least 8 12 times in a 24 hour time period.  During the first 3 5 days of age:  Your baby is wetting at least 3 5 diapers in a 24 hour period. The urine should be clear and pale yellow.  Your baby is having at least 3 4 stools in   a 24 hour period. The stool should be soft and yellow.  At 5 7 days of age, your baby is having at least 3 6 stools in a 24 hour period. The stool should be seedy and yellow by 5 days of age.  Your baby has a weight loss less than 7 10% during the first 3 days of age.  Your baby does not lose weight after 3 7 days of age.  Your baby gains 4 7 ounces each week after he or she is 4 days of age.  Your baby gains weight  by 5 days of age and is back to birth weight within 2 weeks. ENGORGEMENT In the first week after your baby is born, you may experience extremely full breasts (engorgement). When engorged, your breasts may feel heavy, warm, or tender to the touch. Engorgement peaks within 24 48 hours after delivery of your baby.  Engorgement may be reduced by:  Continuing to breastfeed.  Increasing the frequency of breastfeeding.  Taking warm showers or applying warm, moist heat to your breasts just before each feeding. This increases circulation and helps the milk flow.   Gently massaging your breast before and during the feedings. With your fingertips, massage from your chest wall towards your nipple in a circular motion.   Ensuring that your baby empties at least one breast at every feeding. It also helps to start the next feeding on the opposite breast.   Expressing breast milk by hand or by using a breast pump to empty the breasts if your baby is sleepy, or not nursing well. You may also want to express milk if you are returning to work oryou feel you are getting engorged.  Ensuring your baby is latched on and positioned properly while breastfeeding. If you follow these suggestions, your engorgement should improve in 24 48 hours. If you are still experiencing difficulty, call your lactation consultant or caregiver.  CARING FOR YOURSELF Take care of your breasts.  Bathe or shower daily.   Avoid using soap on your nipples.   Wear a supportive bra. Avoid wearing underwire style bras.  Air dry your nipples for a 3 4minutes after each feeding.   Use only cotton bra pads to absorb breast milk leakage. Leaking of breast milk between feedings is normal.   Use only pure lanolin on your nipples after nursing. You do not need to wash it off before feeding your baby again. Another option is to express a few drops of breast milk and gently massage that milk into your nipples.  Continue breast  self-awareness checks. Take care of yourself.  Eat healthy foods. Alternate 3 meals with 3 snacks.  Avoid foods that you notice affect your baby in a bad way.  Drink milk, fruit juice, and water to satisfy your thirst (about 8 glasses a day).   Rest often, relax, and take your prenatal vitamins to prevent fatigue, stress, and anemia.  Avoid chewing and smoking tobacco.  Avoid alcohol and drug use.  Take over-the-counter and prescribed medicine only as directed by your caregiver or pharmacist. You should always check with your caregiver or pharmacist before taking any new medicine, vitamin, or herbal supplement.  Know that pregnancy is possible while breastfeeding. If desired, talk to your caregiver about family planning and safe birth control methods that may be used while breastfeeding. SEEK MEDICAL CARE IF:   You feel like you want to stop breastfeeding or have become frustrated with breastfeeding.  You have painful breasts or nipples.    Your nipples are cracked or bleeding.  Your breasts are red, tender, or warm.  You have a swollen area on either breast.  You have a fever or chills.  You have nausea or vomiting.  You have drainage from your nipples.  Your breasts do not become full before feedings by the 5th day after delivery.  You feel sad and depressed.  Your baby is too sleepy to eat well.  Your baby is having trouble sleeping.   Your baby is wetting less than 3 diapers in a 24 hour period.  Your baby has less than 3 stools in a 24 hour period.  Your baby's skin or the white part of his or her eyes becomes more yellow.   Your baby is not gaining weight by 5 days of age. MAKE SURE YOU:   Understand these instructions.  Will watch your condition.  Will get help right away if you are not doing well or get worse. Document Released: 05/09/2005 Document Revised: 02/01/2012 Document Reviewed: 12/14/2011 [employer] Patient Information 2014 [employer].  

## 2012-11-27 ENCOUNTER — Ambulatory Visit (INDEPENDENT_AMBULATORY_CARE_PROVIDER_SITE_OTHER): Payer: BC Managed Care – PPO | Admitting: Obstetrics & Gynecology

## 2012-11-27 VITALS — BP 117/77 | Wt 160.0 lb

## 2012-11-27 DIAGNOSIS — Z3403 Encounter for supervision of normal first pregnancy, third trimester: Secondary | ICD-10-CM

## 2012-11-27 DIAGNOSIS — Z3400 Encounter for supervision of normal first pregnancy, unspecified trimester: Secondary | ICD-10-CM

## 2012-11-27 DIAGNOSIS — O329XX1 Maternal care for malpresentation of fetus, unspecified, fetus 1: Secondary | ICD-10-CM

## 2012-11-27 DIAGNOSIS — O329XX0 Maternal care for malpresentation of fetus, unspecified, not applicable or unspecified: Secondary | ICD-10-CM | POA: Insufficient documentation

## 2012-11-27 NOTE — Patient Instructions (Addendum)
Return to clinic for any obstetric concerns or go to MAU for evaluation External Cephalic Version External cephalic version is turning a baby that is presenting their buttocks first (breech) or is lying sideways in the uterus (transverse) to a head-first position. This makes the labor and delivery faster, safer for the mother and baby, and lessens the chance for a Cesarean section. It should not be tried until the pregnancy is [redacted] weeks along or longer. BEFORE THE PROCEDURE   Do not take aspirin.  Do not eat for 4 hours before the procedure.  Tell your caregiver if you have a cold, fever or an infection.  Tell your caregiver if you are having contractions.  Tell your caregiver if you are leaking or had a gush of fluid from your vagina.  Tell your caregiver if you have any vaginal bleeding or abnormal discharge.  If you are being admitted the same day, arrive at the hospital at least one hour before the procedure to sign any necessary documents and to get prepared for the procedure.  Tell your caregiver if you had any problems with anesthetics in the past.  Tell your caregiver if you are taking any medications that your caregiver does not know about. This includes over-the-counter and prescription drugs, herbs, eye drops and creams. PROCEDURE  First, an ultrasound is done to make sure the baby is breech or transverse.  A non-stress test or biophysical profile is done on the baby before the ECV. This is done to make sure it is safe for the baby to have the ECV. It may also be done after the procedure to make sure the baby is OK.  ECV is done in the delivery/surgical room with an anesthesiologist present. There should be a setup for an emergency Cesarean section with a full nursing and nursery staff available and ready.  The patient may be given a medication to relax the uterine muscles. An epidural may be given for any discomfort. It is helpful for the success of the ECV.  An electronic  fetal monitor is placed on the uterus during the procedure to make sure the baby is OK.  If the mother is Rh negative, Rho-gam will be given to her to prevent Rh problems for future pregnancies.  The mother is followed closely for 2 to 3 hours after the procedure to make sure no problems develop. BENEFITS OF ECV  Easier and safer labor and delivery for the mother and baby.  Lower incidence of Cesarean section.  Lower costs with a vaginal delivery. RISKS OF ECV  The placenta pulls away from the wall of the uterus before delivery (abruption of the placenta).  Rupture of the uterus, especially in patients with a previous Cesarean section.  Fetal distress.  Early (premature) labor.  Premature rupture of the membranes.  The baby will return to the breech or transverse lie position.  Death of the fetus can happen, but is very rare. ECV SHOULD BE STOPPED IF:  The fetal heart tones drop.  The mother is having a lot of pain.  You cannot turn the baby after several attempts. ECV SHOULD NOT BE DONE IF:  The non-stress test or biophysical profile is abnormal.  There is vaginal bleeding.  An abnormal shaped uterus is present.  There is heart disease or uncontrolled high blood pressure in the mother.  There are twins or more.  The placenta covers the opening of the cervix (placenta previa).  You had a previous cesarean section with a classical  incision or major surgery of the uterus.  There is not enough amniotic fluid in the sac (oligohydramnios).  The baby is too small for the pregnancy or has not developed normally (anomaly).  Your membranes have ruptured. HOME CARE INSTRUCTIONS   Have someone take you home after the procedure.  Rest at home for several hours.  Have someone stay with you for a few hours after you get home.  After ECV, continue with your prenatal visits as directed.  Continue your regular diet, rest and activities.  Do not do any strenuous  activities for a couple of days. SEEK IMMEDIATE MEDICAL CARE IF:   You develop vaginal bleeding.  You have fluid coming out of your vagina (bag of water may have broken).  You develop uterine contractions.  You do not feel the baby move or there is less movement of the baby.  You develop abdominal pain.  You develop an oral temperature of 102 F (38.9 C) or higher. Document Released: 11/01/2006 Document Revised: 08/01/2011 Document Reviewed: 08/27/2008 [employer] Patient Information 2014 [place], [place].

## 2012-11-27 NOTE — Progress Notes (Signed)
P - 60  

## 2012-11-27 NOTE — Progress Notes (Signed)
Pelvic cultures done today. Unable to feel presenting part on exam, ultrasound showed fetus in transverse presentation, head to maternal right, back down.  Reevaluate next week and then further plans will be made.  Talked to the patient about external cephalic version vs cesarean section.  Also told her moxibustion helps in some cases of malpresentation.  No other complaints or concerns.  Fetal movement and labor precautions reviewed.

## 2012-11-28 LAB — GC/CHLAMYDIA PROBE AMP
CT Probe RNA: NEGATIVE
GC Probe RNA: NEGATIVE

## 2012-11-30 ENCOUNTER — Encounter: Payer: Self-pay | Admitting: Obstetrics & Gynecology

## 2012-11-30 LAB — CULTURE, BETA STREP (GROUP B ONLY)

## 2012-12-03 ENCOUNTER — Ambulatory Visit (INDEPENDENT_AMBULATORY_CARE_PROVIDER_SITE_OTHER): Payer: BC Managed Care – PPO | Admitting: Family Medicine

## 2012-12-03 VITALS — BP 133/77 | Wt 161.0 lb

## 2012-12-03 DIAGNOSIS — Z3400 Encounter for supervision of normal first pregnancy, unspecified trimester: Secondary | ICD-10-CM

## 2012-12-03 DIAGNOSIS — O329XX0 Maternal care for malpresentation of fetus, unspecified, not applicable or unspecified: Secondary | ICD-10-CM

## 2012-12-03 DIAGNOSIS — Z3403 Encounter for supervision of normal first pregnancy, third trimester: Secondary | ICD-10-CM

## 2012-12-03 NOTE — Patient Instructions (Signed)
Pregnancy - Third Trimester The third trimester of pregnancy (the last 3 months) is a period of the most rapid growth for you and your baby. The baby approaches a length of 20 inches and a weight of 6 to 10 pounds. The baby is adding on fat and getting ready for life outside your body. While inside, babies have periods of sleeping and waking, sucking thumbs, and hiccuping. You can often feel small contractions of the uterus. This is false labor. It is also called Braxton-Hicks contractions. This is like a practice for labor. The usual problems in this stage of pregnancy include more difficulty breathing, swelling of the hands and feet from water retention, and having to urinate more often because of the uterus and baby pressing on your bladder.  PRENATAL EXAMS  Blood work may continue to be done during prenatal exams. These tests are done to check on your health and the probable health of your baby. Blood work is used to follow your blood levels (hemoglobin). Anemia (low hemoglobin) is common during pregnancy. Iron and vitamins are given to help prevent this. You may also continue to be checked for diabetes. Some of the past blood tests may be done again.  The size of the uterus is measured during each visit. This makes sure your baby is growing properly according to your pregnancy dates.  Your blood pressure is checked every prenatal visit. This is to make sure you are not getting toxemia.  Your urine is checked every prenatal visit for infection, diabetes, and protein.  Your weight is checked at each visit. This is done to make sure gains are happening at the suggested rate and that you and your baby are growing normally.  Sometimes, an ultrasound is performed to confirm the position and the proper growth and development of the baby. This is a test done that bounces harmless sound waves off the baby so your caregiver can more accurately determine a due date.  Discuss the type of pain medicine and  anesthesia you will have during your labor and delivery.  Discuss the possibility and anesthesia if a cesarean section might be necessary.  Inform your caregiver if there is any mental or physical violence at home. Sometimes, a specialized non-stress test, contraction stress test, and biophysical profile are done to make sure the baby is not having a problem. Checking the amniotic fluid surrounding the baby is called an amniocentesis. The amniotic fluid is removed by sticking a needle into the belly (abdomen). This is sometimes done near the end of pregnancy if an early delivery is required. In this case, it is done to help make sure the baby's lungs are mature enough for the baby to live outside of the womb. If the lungs are not mature and it is unsafe to deliver the baby, an injection of cortisone medicine is given to the mother 1 to 2 days before the delivery. This helps the baby's lungs mature and makes it safer to deliver the baby. CHANGES OCCURING IN THE THIRD TRIMESTER OF PREGNANCY Your body goes through many changes during pregnancy. They vary from person to person. Talk to your caregiver about changes you notice and are concerned about.  During the last trimester, you have probably had an increase in your appetite. It is normal to have cravings for certain foods. This varies from person to person and pregnancy to pregnancy.  You may begin to get stretch marks on your hips, abdomen, and breasts. These are normal changes in the body   during pregnancy. There are no exercises or medicines to take which prevent this change.  Constipation may be treated with a stool softener or adding bulk to your diet. Drinking lots of fluids, fiber in vegetables, fruits, and whole grains are helpful.  Exercising is also helpful. If you have been very active up until your pregnancy, most of these activities can be continued during your pregnancy. If you have been less active, it is helpful to start an exercise  program such as walking. Consult your caregiver before starting exercise programs.  Avoid all smoking, alcohol, non-prescribed drugs, herbs and "street drugs" during your pregnancy. These chemicals affect the formation and growth of the baby. Avoid chemicals throughout the pregnancy to ensure the delivery of a healthy infant.  Backache, varicose veins, and hemorrhoids may develop or get worse.  You will tire more easily in the third trimester, which is normal.  The baby's movements may be stronger and more often.  You may become short of breath easily.  Your belly button may stick out.  A yellow discharge may leak from your breasts called colostrum.  You may have a bloody mucus discharge. This usually occurs a few days to a week before labor begins. HOME CARE INSTRUCTIONS   Keep your caregiver's appointments. Follow your caregiver's instructions regarding medicine use, exercise, and diet.  During pregnancy, you are providing food for you and your baby. Continue to eat regular, well-balanced meals. Choose foods such as meat, fish, milk and other low fat dairy products, vegetables, fruits, and whole-grain breads and cereals. Your caregiver will tell you of the ideal weight gain.  A physical sexual relationship may be continued throughout pregnancy if there are no other problems such as early (premature) leaking of amniotic fluid from the membranes, vaginal bleeding, or belly (abdominal) pain.  Exercise regularly if there are no restrictions. Check with your caregiver if you are unsure of the safety of your exercises. Greater weight gain will occur in the last 2 trimesters of pregnancy. Exercising helps:  Control your weight.  Get you in shape for labor and delivery.  You lose weight after you deliver.  Rest a lot with legs elevated, or as needed for leg cramps or low back pain.  Wear a good support or jogging bra for breast tenderness during pregnancy. This may help if worn during  sleep. Pads or tissues may be used in the bra if you are leaking colostrum.  Do not use hot tubs, steam rooms, or saunas.  Wear your seat belt when driving. This protects you and your baby if you are in an accident.  Avoid raw meat, cat litter boxes and soil used by cats. These carry germs that can cause birth defects in the baby.  It is easier to leak urine during pregnancy. Tightening up and strengthening the pelvic muscles will help with this problem. You can practice stopping your urination while you are going to the bathroom. These are the same muscles you need to strengthen. It is also the muscles you would use if you were trying to stop from passing gas. You can practice tightening these muscles up 10 times a set and repeating this about 3 times per day. Once you know what muscles to tighten up, do not perform these exercises during urination. It is more likely to cause an infection by backing up the urine.  Ask for help if you have financial, counseling, or nutritional needs during pregnancy. Your caregiver will be able to offer counseling for these   needs as well as refer you for other special needs.  Make a list of emergency phone numbers and have them available.  Plan on getting help from family or friends when you go home from the hospital.  Make a trial run to the hospital.  Take prenatal classes with the father to understand, practice, and ask questions about the labor and delivery.  Prepare the baby's room or nursery.  Do not travel out of the city unless it is absolutely necessary and with the advice of your caregiver.  Wear only low or no heal shoes to have better balance and prevent falling. MEDICINES AND DRUG USE IN PREGNANCY  Take prenatal vitamins as directed. The vitamin should contain 1 milligram of folic acid. Keep all vitamins out of reach of children. Only a couple vitamins or tablets containing iron may be fatal to a baby or young child when ingested.  Avoid use  of all medicines, including herbs, over-the-counter medicines, not prescribed or suggested by your caregiver. Only take over-the-counter or prescription medicines for pain, discomfort, or fever as directed by your caregiver. Do not use aspirin, ibuprofen or naproxen unless approved by your caregiver.  Let your caregiver also know about herbs you may be using.  Alcohol is related to a number of birth defects. This includes fetal alcohol syndrome. All alcohol, in any form, should be avoided completely. Smoking will cause low birth rate and premature babies.  Illegal drugs are very harmful to the baby. They are absolutely forbidden. A baby born to an addicted mother will be addicted at birth. The baby will go through the same withdrawal an adult does. SEEK MEDICAL CARE IF: You have any concerns or worries during your pregnancy. It is better to call with your questions if you feel they cannot wait, rather than worry about them. SEEK IMMEDIATE MEDICAL CARE IF:   An unexplained oral temperature above 102 F (38.9 C) develops, or as your caregiver suggests.  You have leaking of fluid from the vagina. If leaking membranes are suspected, take your temperature and tell your caregiver of this when you call.  There is vaginal spotting, bleeding or passing clots. Tell your caregiver of the amount and how many pads are used.  You develop a bad smelling vaginal discharge with a change in the color from clear to white.  You develop vomiting that lasts more than 24 hours.  You develop chills or fever.  You develop shortness of breath.  You develop burning on urination.  You loose more than 2 pounds of weight or gain more than 2 pounds of weight or as suggested by your caregiver.  You notice sudden swelling of your face, hands, and feet or legs.  You develop belly (abdominal) pain. Round ligament discomfort is a common non-cancerous (benign) cause of abdominal pain in pregnancy. Your caregiver still  must evaluate you.  You develop a severe headache that does not go away.  You develop visual problems, blurred or double vision.  If you have not felt your baby move for more than 1 hour. If you think the baby is not moving as much as usual, eat something with sugar in it and lie down on your left side for an hour. The baby should move at least 4 to 5 times per hour. Call right away if your baby moves less than that.  You fall, are in a car accident, or any kind of trauma.  There is mental or physical violence at home. Document Released: 05/03/2001   Document Revised: 02/01/2012 Document Reviewed: 11/05/2008 [employer] Patient Information 2014 [employer].  Breastfeeding A change in hormones during your pregnancy causes growth of your breast tissue and an increase in number and size of milk ducts. The hormone prolactin allows proteins, sugars, and fats from your blood supply to make breast milk in your milk-producing glands. The hormone progesterone prevents breast milk from being released before the birth of your baby. After the birth of your baby, your progesterone level decreases allowing breast milk to be released. Thoughts of your baby, as well as his or her sucking or crying, can stimulate the release of milk from the milk-producing glands. Deciding to breastfeed (nurse) is one of the best choices you can make for you and your baby. The information that follows gives a brief review of the benefits, as well as other important skills to know about breastfeeding. BENEFITS OF BREASTFEEDING For your baby  The first milk (colostrum) helps your baby's digestive system function better.   There are antibodies in your milk that help your baby fight off infections.   Your baby has a lower incidence of asthma, allergies, and sudden infant death syndrome (SIDS).   The nutrients in breast milk are better for your baby than infant formulas.  Breast milk improves your baby's brain development.    Your baby will have less gas, colic, and constipation.  Your baby is less likely to develop other conditions, such as childhood obesity, asthma, or diabetes mellitus. For you  Breastfeeding helps develop a very special bond between you and your baby.   Breastfeeding is convenient, always available at the correct temperature, and costs nothing.   Breastfeeding helps to burn calories and helps you lose the weight gained during pregnancy.   Breastfeeding makes your uterus contract back down to normal size faster and slows bleeding following delivery.   Breastfeeding mothers have a lower risk of developing osteoporosis or breast or ovarian cancer later in life.  BREASTFEEDING FREQUENCY  A healthy, full-term baby may breastfeed as often as every hour or space his or her feedings to every 3 hours. Breastfeeding frequency will vary from baby to baby.   Newborns should be fed no less than every 2 3 hours during the day and every 4 5 hours during the night. You should breastfeed a minimum of 8 feedings in a 24 hour period.  Awaken your baby to breastfeed if it has been 3 4 hours since the last feeding.  Breastfeed when you feel the need to reduce the fullness of your breasts or when your newborn shows signs of hunger. Signs that your baby may be hungry include:  Increased alertness or activity.  Stretching.  Movement of the head from side to side.  Movement of the head and opening of the mouth when the corner of the mouth or cheek is stroked (rooting).  Increased sucking sounds, smacking lips, cooing, sighing, or squeaking.  Hand-to-mouth movements.  Increased sucking of fingers or hands.  Fussing.  Intermittent crying.  Signs of extreme hunger will require calming and consoling before you try to feed your baby. Signs of extreme hunger may include:  Restlessness.  A loud, strong cry.  Screaming.  Frequent feeding will help you make more milk and will help prevent  problems, such as sore nipples and engorgement of the breasts.  BREASTFEEDING   Whether lying down or sitting, be sure that the baby's abdomen is facing your abdomen.   Support your breast with 4 fingers under your breast   and your thumb above your nipple. Make sure your fingers are well away from your nipple and your baby's mouth.   Stroke your baby's lips gently with your finger or nipple.   When your baby's mouth is open wide enough, place all of your nipple and as much of the colored area around your nipple (areola) as possible into your baby's mouth.  More areola should be visible above his or her upper lip than below his or her lower lip.  Your baby's tongue should be between his or her lower gum and your breast.  Ensure that your baby's mouth is correctly positioned around the nipple (latched). Your baby's lips should create a seal on your breast.  Signs that your baby has effectively latched onto your nipple include:  Tugging or sucking without pain.  Swallowing heard between sucks.  Absent click or smacking sound.  Muscle movement above and in front of his or her ears with sucking.  Your baby must suck about 2 3 minutes in order to get your milk. Allow your baby to feed on each breast as long as he or she wants. Nurse your baby until he or she unlatches or falls asleep at the first breast, then offer the second breast.  Signs that your baby is full and satisfied include:  A gradual decrease in the number of sucks or complete cessation of sucking.  Falling asleep.  Extension or relaxation of his or her body.  Retention of a small amount of milk in his or her mouth.  Letting go of your breast by himself or herself.  Signs of effective breastfeeding in you include:  Breasts that have increased firmness, weight, and size prior to feeding.  Breasts that are softer after nursing.  Increased milk volume, as well as a change in milk consistency and color by the 5th  day of breastfeeding.  Breast fullness relieved by breastfeeding.  Nipples are not sore, cracked, or bleeding.  If needed, break the suction by putting your finger into the corner of your baby's mouth and sliding your finger between his or her gums. Then, remove your breast from his or her mouth.  It is common for babies to spit up a small amount after a feeding.  Babies often swallow air during feeding. This can make babies fussy. Burping your baby between breasts can help with this.  Vitamin D supplements are recommended for babies who get only breast milk.  Avoid using a pacifier during your baby's first 4 6 weeks.  Avoid supplemental feedings of water, formula, or juice in place of breastfeeding. Breast milk is all the food your baby needs. It is not necessary for your baby to have water or formula. Your breasts will make more milk if supplemental feedings are avoided during the early weeks. HOW TO TELL WHETHER YOUR BABY IS GETTING ENOUGH BREAST MILK Wondering whether or not your baby is getting enough milk is a common concern among mothers. You can be assured that your baby is getting enough milk if:   Your baby is actively sucking and you hear swallowing.   Your baby seems relaxed and satisfied after a feeding.   Your baby nurses at least 8 12 times in a 24 hour time period.  During the first 3 5 days of age:  Your baby is wetting at least 3 5 diapers in a 24 hour period. The urine should be clear and pale yellow.  Your baby is having at least 3 4 stools in   a 24 hour period. The stool should be soft and yellow.  At 5 7 days of age, your baby is having at least 3 6 stools in a 24 hour period. The stool should be seedy and yellow by 5 days of age.  Your baby has a weight loss less than 7 10% during the first 3 days of age.  Your baby does not lose weight after 3 7 days of age.  Your baby gains 4 7 ounces each week after he or she is 4 days of age.  Your baby gains weight  by 5 days of age and is back to birth weight within 2 weeks. ENGORGEMENT In the first week after your baby is born, you may experience extremely full breasts (engorgement). When engorged, your breasts may feel heavy, warm, or tender to the touch. Engorgement peaks within 24 48 hours after delivery of your baby.  Engorgement may be reduced by:  Continuing to breastfeed.  Increasing the frequency of breastfeeding.  Taking warm showers or applying warm, moist heat to your breasts just before each feeding. This increases circulation and helps the milk flow.   Gently massaging your breast before and during the feedings. With your fingertips, massage from your chest wall towards your nipple in a circular motion.   Ensuring that your baby empties at least one breast at every feeding. It also helps to start the next feeding on the opposite breast.   Expressing breast milk by hand or by using a breast pump to empty the breasts if your baby is sleepy, or not nursing well. You may also want to express milk if you are returning to work oryou feel you are getting engorged.  Ensuring your baby is latched on and positioned properly while breastfeeding. If you follow these suggestions, your engorgement should improve in 24 48 hours. If you are still experiencing difficulty, call your lactation consultant or caregiver.  CARING FOR YOURSELF Take care of your breasts.  Bathe or shower daily.   Avoid using soap on your nipples.   Wear a supportive bra. Avoid wearing underwire style bras.  Air dry your nipples for a 3 4minutes after each feeding.   Use only cotton bra pads to absorb breast milk leakage. Leaking of breast milk between feedings is normal.   Use only pure lanolin on your nipples after nursing. You do not need to wash it off before feeding your baby again. Another option is to express a few drops of breast milk and gently massage that milk into your nipples.  Continue breast  self-awareness checks. Take care of yourself.  Eat healthy foods. Alternate 3 meals with 3 snacks.  Avoid foods that you notice affect your baby in a bad way.  Drink milk, fruit juice, and water to satisfy your thirst (about 8 glasses a day).   Rest often, relax, and take your prenatal vitamins to prevent fatigue, stress, and anemia.  Avoid chewing and smoking tobacco.  Avoid alcohol and drug use.  Take over-the-counter and prescribed medicine only as directed by your caregiver or pharmacist. You should always check with your caregiver or pharmacist before taking any new medicine, vitamin, or herbal supplement.  Know that pregnancy is possible while breastfeeding. If desired, talk to your caregiver about family planning and safe birth control methods that may be used while breastfeeding. SEEK MEDICAL CARE IF:   You feel like you want to stop breastfeeding or have become frustrated with breastfeeding.  You have painful breasts or nipples.    Your nipples are cracked or bleeding.  Your breasts are red, tender, or warm.  You have a swollen area on either breast.  You have a fever or chills.  You have nausea or vomiting.  You have drainage from your nipples.  Your breasts do not become full before feedings by the 5th day after delivery.  You feel sad and depressed.  Your baby is too sleepy to eat well.  Your baby is having trouble sleeping.   Your baby is wetting less than 3 diapers in a 24 hour period.  Your baby has less than 3 stools in a 24 hour period.  Your baby's skin or the white part of his or her eyes becomes more yellow.   Your baby is not gaining weight by 5 days of age. MAKE SURE YOU:   Understand these instructions.  Will watch your condition.  Will get help right away if you are not doing well or get worse. Document Released: 05/09/2005 Document Revised: 02/01/2012 Document Reviewed: 12/14/2011 [employer] Patient Information 2014 [employer].  

## 2012-12-03 NOTE — Progress Notes (Signed)
P-58 

## 2012-12-03 NOTE — Progress Notes (Signed)
For ECV.--risks and benefits discussed.  Alternatives, moxibustion discussed.  Scheduled for 12/05/12 GBS negative.

## 2012-12-04 ENCOUNTER — Inpatient Hospital Stay (HOSPITAL_COMMUNITY): Payer: BC Managed Care – PPO

## 2012-12-04 ENCOUNTER — Telehealth (HOSPITAL_COMMUNITY): Payer: Self-pay | Admitting: *Deleted

## 2012-12-04 ENCOUNTER — Encounter (HOSPITAL_COMMUNITY): Payer: Self-pay | Admitting: *Deleted

## 2012-12-04 NOTE — Telephone Encounter (Signed)
Preadmission screen  

## 2012-12-06 ENCOUNTER — Encounter (HOSPITAL_COMMUNITY): Payer: Self-pay | Admitting: Pharmacy Technician

## 2012-12-06 ENCOUNTER — Observation Stay (HOSPITAL_COMMUNITY)
Admission: RE | Admit: 2012-12-06 | Discharge: 2012-12-06 | Disposition: A | Discharge status: Home / Self Care | Payer: BC Managed Care – PPO | Source: Ambulatory Visit | Attending: Family Medicine | Admitting: Family Medicine

## 2012-12-06 VITALS — BP 120/73 | HR 66 | Temp 98.6°F | Resp 18 | Ht 66.0 in | Wt 161.0 lb

## 2012-12-06 DIAGNOSIS — O329XX0 Maternal care for malpresentation of fetus, unspecified, not applicable or unspecified: Secondary | ICD-10-CM

## 2012-12-06 MED ORDER — TERBUTALINE SULFATE 1 MG/ML IJ SOLN
0.2500 mg | Freq: Once | INTRAMUSCULAR | Status: AC
  Administered 2012-12-06: 0.25 mg via SUBCUTANEOUS
  Filled 2012-12-06: qty 1

## 2012-12-06 NOTE — H&P (Signed)
Danielle Sawyer is a 32 y.o. female presenting for ECV. History OB History   Grav Para Term Preterm Abortions TAB SAB Ect Mult Living   1              Past Medical History  Diagnosis Date  . Medical history non-contributory    Past Surgical History  Procedure Laterality Date  . Wisdom tooth extraction      x3   Family History: family history includes Cancer in her maternal grandfather, maternal grandmother, paternal aunts, paternal grandfather, and paternal grandmother and Heart disease in her father. Social History:  reports that she has never smoked. She has never used smokeless tobacco. She reports that  drinks alcohol. She reports that she does not use illicit drugs.   Prenatal Transfer Tool  Maternal Diabetes: No Genetic Screening: Declined Maternal Ultrasounds/Referrals: Normal--had renal pyelectasis which has resolved. Fetal Ultrasounds or other Referrals:  None Maternal Substance Abuse:  No Significant Maternal Medications:  None Significant Maternal Lab Results:  None Other Comments:  None  ROS    Blood pressure 134/64, pulse 61, resp. rate 18, height 5\' 6"  (1.676 m), weight 161 lb (73.029 kg), last menstrual period 03/21/2012. Exam Physical Exam  Vitals reviewed. Constitutional: She is oriented to person, place, and time. She appears well-developed and well-nourished.  HENT:  Head: Normocephalic and atraumatic.  Respiratory: Effort normal.  GI: Soft.  Musculoskeletal: Normal range of motion.  Neurological: She is alert and oriented to person, place, and time.  Skin: Skin is warm and dry.  Psychiatric: She has a normal mood and affect.     Assessment/Plan: 32 yo G1P0 at 37 weeks 1 day in transverse presentation with head to maternal right.  Pt presents for External cepahlic version.  Risks include but not limited to failure of version, maternal pain during version, damage to placenta, fetal distress needing emergency c/s.  Written consent signed.  External  Cephalic Version  Preprocedure Diagnosis:  32 y.o. G1P0 at 35.[redacted] weeks gestational age with transverse presentation  Post-procedure Diagnosis: 32 y.o. G1P0 at 75.[redacted] weeks gestational age with persistent transverse presentation  Procedure:  Failed external cephalic version  Procedure in detail:   The patient was brought to Labor and Delivery where a reactive fetal heart tracing was obtained. The patient was noted to have irregular contractions. She was given 1 dose of subcutaneous terbutaline which resolved her contraction. A bedside ultrasound was performed which revealed single intrauterine pregnancy and transverse back down presentation. There was noted to be adequate fluid. Using manual pressure, ECV was attempted.  Pt experienced extreme discomfort and asked for procedure to stop in 10 seconds. Pt was placed back on the monitor and noted to have a reassuring and reactive tracing for 1 hour following the external cephalic version. She did not have regular contractions and therefore she was felt to be stable for discharge to home. She was given appropriate labor instructions.     LEGGETT,KELLY H. 12/06/2012, 8:48 AM

## 2012-12-06 NOTE — Discharge Summary (Signed)
Physician Discharge Summary  Patient ID: Danielle Sawyer MRN: [id number] DOB/AGE: 1980-07-23 32 y.o.  Admit date: 12/06/2012 Discharge date: 12/06/2012  Admission Diagnoses:  Discharge Diagnoses:  Fetal Malpresentation  Discharged Condition: good  Hospital Course: Pt presented for ECV at 37 weeks 1 day.  Reassuring fetal status and adequate flid noted on bedside US.  ECT attempted (see note).  Pt requested for procedure to stop.  Pt kept for 1 hour after attempted ECV.  C/S scheduled for 39 weeks.  Consults: None  Significant Diagnostic Studies: radiology: sonogram  Treatments: none  Discharge Exam: Blood pressure 120/73, pulse 66, temperature 98.6 F (37 C), temperature source Oral, resp. rate 18, height 5\' 6"  (1.676 m), weight 161 lb (73.029 kg), last menstrual period 03/21/2012. General appearance: alert, cooperative and no distress GI: gravid NT Extremities: extremities normal, atraumatic, no cyanosis or edema  Disposition: Final discharge disposition not confirmed  Discharge Orders   Future Appointments Provider Department Dept Phone   12/11/2012 9:15 AM Tereso Newcomer, MD [facility] [place] [facility] [phone]   Future Orders Complete By Expires     Discharge patient  As directed     Comments:      At 10 a.m. If NST remains reassuring        Medication List         prenatal multivitamin Tabs  Take 1 tablet by mouth daily at 12 noon.           Follow-up Information   Follow up with Reva Bores, MD In 1 week.   Contact information:   [address] [place] [address] [phone]       Signed: Lesly Dukes 12/06/2012, 9:14 AM

## 2012-12-10 ENCOUNTER — Other Ambulatory Visit: Payer: Self-pay | Admitting: Obstetrics & Gynecology

## 2012-12-11 ENCOUNTER — Encounter: Payer: Self-pay | Admitting: Obstetrics & Gynecology

## 2012-12-11 ENCOUNTER — Ambulatory Visit (INDEPENDENT_AMBULATORY_CARE_PROVIDER_SITE_OTHER): Payer: BC Managed Care – PPO | Admitting: Obstetrics & Gynecology

## 2012-12-11 VITALS — BP 134/75 | Wt 160.0 lb

## 2012-12-11 DIAGNOSIS — Z3400 Encounter for supervision of normal first pregnancy, unspecified trimester: Secondary | ICD-10-CM

## 2012-12-11 DIAGNOSIS — Z3403 Encounter for supervision of normal first pregnancy, third trimester: Secondary | ICD-10-CM

## 2012-12-11 NOTE — Patient Instructions (Addendum)
Return to clinic for any obstetric concerns or go to MAU for evaluation  

## 2012-12-11 NOTE — Progress Notes (Signed)
Failed ECV, cesarean scheduled 12/19/12.  Complete breech today.  No other complaints or concerns.  Fetal movement and labor precautions reviewed.

## 2012-12-17 ENCOUNTER — Encounter (HOSPITAL_COMMUNITY)
Admission: RE | Admit: 2012-12-17 | Discharge: 2012-12-17 | Disposition: A | Discharge status: Home / Self Care | Payer: BC Managed Care – PPO | Source: Ambulatory Visit | Attending: Obstetrics & Gynecology | Admitting: Obstetrics & Gynecology

## 2012-12-17 ENCOUNTER — Encounter (HOSPITAL_COMMUNITY): Payer: Self-pay

## 2012-12-17 VITALS — BP 119/61 | HR 62 | Temp 98.2°F | Resp 16 | Ht 66.0 in | Wt 160.0 lb

## 2012-12-17 DIAGNOSIS — O329XX0 Maternal care for malpresentation of fetus, unspecified, not applicable or unspecified: Secondary | ICD-10-CM

## 2012-12-17 DIAGNOSIS — O358XX0 Maternal care for other (suspected) fetal abnormality and damage, not applicable or unspecified: Secondary | ICD-10-CM

## 2012-12-17 LAB — CBC
MCH: 32.4 pg (ref 26.0–34.0)
MCHC: 35 g/dL (ref 30.0–36.0)
MCV: 92.8 fL (ref 78.0–100.0)
Platelets: 219 10*3/uL (ref 150–400)
RBC: 3.73 MIL/uL — ABNORMAL LOW (ref 3.87–5.11)
RDW: 12.9 % (ref 11.5–15.5)

## 2012-12-17 LAB — TYPE AND SCREEN

## 2012-12-17 NOTE — Patient Instructions (Addendum)
20 Danielle Sawyer  12/17/2012   Your procedure is scheduled on:  12/19/12  Enter through the Main Entrance of [facility] at 8 AM.  Pick up the phone at the desk and dial [phone].   Call this number if you have problems the morning of surgery: [phone]   Remember:   Do not eat food:After Midnight.  Do not drink clear liquids: After Midnight.  Take these medicines the morning of surgery with A SIP OF WATER: NA   Do not wear jewelry, make-up or nail polish.  Do not wear lotions, powders, or perfumes. You may wear deodorant.  Do not shave 48 hours prior to surgery.  Do not bring valuables to the hospital.  [facility] is not responsible                  for any belongings or valuables brought to the hospital.  Contacts, dentures or bridgework may not be worn into surgery.  Leave suitcase in the car. After surgery it may be brought to your room.  For patients admitted to the hospital, checkout time is 11:00 AM the day of                discharge.   Patients discharged the day of surgery will not be allowed to drive                   home.  Name and phone number of your driver: NA  Special Instructions: Shower using CHG 2 nights before surgery and the night before surgery.  If you shower the day of surgery use CHG.  Use special wash - you have one bottle of CHG for all showers.  You should use approximately 1/3 of the bottle for each shower.   Please read over the following fact sheets that you were given: Surgical Site Infection Prevention

## 2012-12-18 LAB — RPR: RPR Ser Ql: NONREACTIVE

## 2012-12-19 ENCOUNTER — Encounter (HOSPITAL_COMMUNITY)
Admission: RE | Disposition: A | Discharge status: Home / Self Care | Payer: Self-pay | Source: Ambulatory Visit | Attending: Obstetrics & Gynecology

## 2012-12-19 ENCOUNTER — Inpatient Hospital Stay (HOSPITAL_COMMUNITY)
Admission: RE | Admit: 2012-12-19 | Payer: BC Managed Care – PPO | Source: Ambulatory Visit | Admitting: Obstetrics & Gynecology

## 2012-12-19 ENCOUNTER — Encounter (HOSPITAL_COMMUNITY): Payer: Self-pay | Admitting: Anesthesiology

## 2012-12-19 ENCOUNTER — Inpatient Hospital Stay (HOSPITAL_COMMUNITY): Payer: BC Managed Care – PPO | Admitting: Anesthesiology

## 2012-12-19 ENCOUNTER — Encounter (HOSPITAL_COMMUNITY): Payer: Self-pay | Admitting: Certified Registered"

## 2012-12-19 ENCOUNTER — Inpatient Hospital Stay (HOSPITAL_COMMUNITY)
Admission: RE | Admit: 2012-12-19 | Discharge: 2012-12-22 | DRG: 371 | Disposition: A | Discharge status: Home / Self Care | Payer: BC Managed Care – PPO | Source: Ambulatory Visit | Attending: Obstetrics & Gynecology | Admitting: Obstetrics & Gynecology

## 2012-12-19 DIAGNOSIS — O321XX0 Maternal care for breech presentation, not applicable or unspecified: Principal | ICD-10-CM | POA: Diagnosis present

## 2012-12-19 DIAGNOSIS — Z3400 Encounter for supervision of normal first pregnancy, unspecified trimester: Secondary | ICD-10-CM

## 2012-12-19 DIAGNOSIS — O329XX0 Maternal care for malpresentation of fetus, unspecified, not applicable or unspecified: Secondary | ICD-10-CM

## 2012-12-19 DIAGNOSIS — O358XX0 Maternal care for other (suspected) fetal abnormality and damage, not applicable or unspecified: Secondary | ICD-10-CM

## 2012-12-19 SURGERY — Surgical Case
Anesthesia: Regional | Site: Abdomen

## 2012-12-19 SURGERY — Surgical Case
Anesthesia: Spinal | Site: Abdomen | Wound class: Clean Contaminated

## 2012-12-19 MED ORDER — HYDROMORPHONE HCL PF 1 MG/ML IJ SOLN: 0.2500 mg | INTRAMUSCULAR | Status: DC | PRN

## 2012-12-19 MED ORDER — WITCH HAZEL-GLYCERIN EX PADS: 1.0000 "application " | MEDICATED_PAD | CUTANEOUS | Status: DC | PRN

## 2012-12-19 MED ORDER — DIBUCAINE 1 % RE OINT: 1.0000 "application " | TOPICAL_OINTMENT | RECTAL | Status: DC | PRN

## 2012-12-19 MED ORDER — OXYTOCIN 10 UNIT/ML IJ SOLN
INTRAMUSCULAR | Status: AC
  Filled 2012-12-19: qty 4

## 2012-12-19 MED ORDER — LACTATED RINGERS IV SOLN
Freq: Once | INTRAVENOUS | Status: AC
  Administered 2012-12-19: 08:00:00 via INTRAVENOUS

## 2012-12-19 MED ORDER — MEPERIDINE HCL 25 MG/ML IJ SOLN: 6.2500 mg | INTRAMUSCULAR | Status: DC | PRN

## 2012-12-19 MED ORDER — ONDANSETRON HCL 4 MG/2ML IJ SOLN: 4.0000 mg | INTRAMUSCULAR | Status: DC | PRN

## 2012-12-19 MED ORDER — LANOLIN HYDROUS EX OINT: 1.0000 "application " | TOPICAL_OINTMENT | CUTANEOUS | Status: DC | PRN

## 2012-12-19 MED ORDER — NALBUPHINE HCL 10 MG/ML IJ SOLN
5.0000 mg | INTRAMUSCULAR | Status: DC | PRN
  Filled 2012-12-19: qty 1

## 2012-12-19 MED ORDER — LACTATED RINGERS IV SOLN
INTRAVENOUS | Status: DC
  Administered 2012-12-19 (×3): via INTRAVENOUS

## 2012-12-19 MED ORDER — SODIUM CHLORIDE 0.9 % IJ SOLN: 3.0000 mL | INTRAMUSCULAR | Status: DC | PRN

## 2012-12-19 MED ORDER — ONDANSETRON HCL 4 MG/2ML IJ SOLN
INTRAMUSCULAR | Status: DC | PRN
  Administered 2012-12-19: 4 mg via INTRAVENOUS

## 2012-12-19 MED ORDER — DIPHENHYDRAMINE HCL 50 MG/ML IJ SOLN: 25.0000 mg | INTRAMUSCULAR | Status: DC | PRN

## 2012-12-19 MED ORDER — FENTANYL CITRATE 0.05 MG/ML IJ SOLN
INTRAMUSCULAR | Status: AC
  Filled 2012-12-19: qty 2

## 2012-12-19 MED ORDER — KETOROLAC TROMETHAMINE 30 MG/ML IJ SOLN: 30.0000 mg | Freq: Four times a day (QID) | INTRAMUSCULAR | Status: DC | PRN

## 2012-12-19 MED ORDER — KETOROLAC TROMETHAMINE 60 MG/2ML IM SOLN
60.0000 mg | Freq: Once | INTRAMUSCULAR | Status: AC | PRN
  Administered 2012-12-19: 60 mg via INTRAMUSCULAR

## 2012-12-19 MED ORDER — SCOPOLAMINE 1 MG/3DAYS TD PT72
MEDICATED_PATCH | TRANSDERMAL | Status: AC
  Administered 2012-12-19: 1.5 mg via TRANSDERMAL
  Filled 2012-12-19: qty 1

## 2012-12-19 MED ORDER — PRENATAL MULTIVITAMIN CH
1.0000 | ORAL_TABLET | Freq: Every day | ORAL | Status: DC
  Administered 2012-12-20 – 2012-12-21 (×2): 1 via ORAL
  Filled 2012-12-19 (×2): qty 1

## 2012-12-19 MED ORDER — SCOPOLAMINE 1 MG/3DAYS TD PT72
MEDICATED_PATCH | TRANSDERMAL | Status: AC
  Filled 2012-12-19: qty 1

## 2012-12-19 MED ORDER — DIPHENHYDRAMINE HCL 25 MG PO CAPS
25.0000 mg | ORAL_CAPSULE | ORAL | Status: DC | PRN
  Filled 2012-12-19: qty 1

## 2012-12-19 MED ORDER — DIPHENHYDRAMINE HCL 25 MG PO CAPS: 25.0000 mg | ORAL_CAPSULE | Freq: Four times a day (QID) | ORAL | Status: DC | PRN

## 2012-12-19 MED ORDER — MEASLES, MUMPS & RUBELLA VAC ~~LOC~~ INJ: 0.5000 mL | INJECTION | Freq: Once | SUBCUTANEOUS | Status: DC

## 2012-12-19 MED ORDER — SIMETHICONE 80 MG PO CHEW: 80.0000 mg | CHEWABLE_TABLET | ORAL | Status: DC | PRN

## 2012-12-19 MED ORDER — IBUPROFEN 600 MG PO TABS
600.0000 mg | ORAL_TABLET | Freq: Four times a day (QID) | ORAL | Status: DC
  Administered 2012-12-20 – 2012-12-22 (×10): 600 mg via ORAL
  Filled 2012-12-19 (×11): qty 1

## 2012-12-19 MED ORDER — OXYTOCIN 40 UNITS IN LACTATED RINGERS INFUSION - SIMPLE MED: 62.5000 mL/h | INTRAVENOUS | Status: AC

## 2012-12-19 MED ORDER — FENTANYL CITRATE 0.05 MG/ML IJ SOLN
INTRAMUSCULAR | Status: DC | PRN
  Administered 2012-12-19: 25 ug via INTRATHECAL

## 2012-12-19 MED ORDER — PHENYLEPHRINE HCL 10 MG/ML IJ SOLN
INTRAMUSCULAR | Status: DC | PRN
  Administered 2012-12-19: 40 ug via INTRAVENOUS
  Administered 2012-12-19 (×2): 80 ug via INTRAVENOUS

## 2012-12-19 MED ORDER — SCOPOLAMINE 1 MG/3DAYS TD PT72: 1.0000 | MEDICATED_PATCH | Freq: Once | TRANSDERMAL | Status: DC

## 2012-12-19 MED ORDER — SIMETHICONE 80 MG PO CHEW
80.0000 mg | CHEWABLE_TABLET | Freq: Three times a day (TID) | ORAL | Status: DC
  Administered 2012-12-19 – 2012-12-22 (×8): 80 mg via ORAL

## 2012-12-19 MED ORDER — BUPIVACAINE IN DEXTROSE 0.75-8.25 % IT SOLN
INTRATHECAL | Status: DC | PRN
  Administered 2012-12-19: 1.6 mL via INTRATHECAL

## 2012-12-19 MED ORDER — TETANUS-DIPHTH-ACELL PERTUSSIS 5-2.5-18.5 LF-MCG/0.5 IM SUSP: 0.5000 mL | Freq: Once | INTRAMUSCULAR | Status: DC

## 2012-12-19 MED ORDER — LACTATED RINGERS IV SOLN
INTRAVENOUS | Status: DC
  Administered 2012-12-20: 02:00:00 via INTRAVENOUS

## 2012-12-19 MED ORDER — EPHEDRINE 5 MG/ML INJ
INTRAVENOUS | Status: AC
  Filled 2012-12-19: qty 10

## 2012-12-19 MED ORDER — EPHEDRINE SULFATE 50 MG/ML IJ SOLN
INTRAMUSCULAR | Status: DC | PRN
  Administered 2012-12-19 (×3): 10 mg via INTRAVENOUS

## 2012-12-19 MED ORDER — ONDANSETRON HCL 4 MG/2ML IJ SOLN: 4.0000 mg | Freq: Three times a day (TID) | INTRAMUSCULAR | Status: DC | PRN

## 2012-12-19 MED ORDER — CEFAZOLIN SODIUM-DEXTROSE 2-3 GM-% IV SOLR
INTRAVENOUS | Status: AC
  Filled 2012-12-19: qty 50

## 2012-12-19 MED ORDER — MENTHOL 3 MG MT LOZG: 1.0000 | LOZENGE | OROMUCOSAL | Status: DC | PRN

## 2012-12-19 MED ORDER — OXYTOCIN 10 UNIT/ML IJ SOLN
40.0000 [IU] | INTRAVENOUS | Status: DC | PRN
  Administered 2012-12-19: 40 [IU] via INTRAVENOUS

## 2012-12-19 MED ORDER — SENNOSIDES-DOCUSATE SODIUM 8.6-50 MG PO TABS
2.0000 | ORAL_TABLET | Freq: Every day | ORAL | Status: DC
  Administered 2012-12-19 – 2012-12-20 (×2): 2 via ORAL

## 2012-12-19 MED ORDER — FERROUS SULFATE 325 (65 FE) MG PO TABS
325.0000 mg | ORAL_TABLET | Freq: Two times a day (BID) | ORAL | Status: DC
  Administered 2012-12-20 – 2012-12-22 (×4): 325 mg via ORAL
  Filled 2012-12-19 (×5): qty 1

## 2012-12-19 MED ORDER — MORPHINE SULFATE 0.5 MG/ML IJ SOLN
INTRAMUSCULAR | Status: AC
  Filled 2012-12-19: qty 10

## 2012-12-19 MED ORDER — BISACODYL 10 MG RE SUPP: 10.0000 mg | Freq: Every day | RECTAL | Status: DC | PRN

## 2012-12-19 MED ORDER — METOCLOPRAMIDE HCL 5 MG/ML IJ SOLN: 10.0000 mg | Freq: Three times a day (TID) | INTRAMUSCULAR | Status: DC | PRN

## 2012-12-19 MED ORDER — NALOXONE HCL 0.4 MG/ML IJ SOLN: 0.4000 mg | INTRAMUSCULAR | Status: DC | PRN

## 2012-12-19 MED ORDER — CEFAZOLIN SODIUM-DEXTROSE 2-3 GM-% IV SOLR
2.0000 g | INTRAVENOUS | Status: AC
  Administered 2012-12-19: 2 g via INTRAVENOUS

## 2012-12-19 MED ORDER — KETOROLAC TROMETHAMINE 60 MG/2ML IM SOLN
INTRAMUSCULAR | Status: AC
  Filled 2012-12-19: qty 2

## 2012-12-19 MED ORDER — FLEET ENEMA 7-19 GM/118ML RE ENEM: 1.0000 | ENEMA | Freq: Every day | RECTAL | Status: DC | PRN

## 2012-12-19 MED ORDER — MORPHINE SULFATE (PF) 0.5 MG/ML IJ SOLN
INTRAMUSCULAR | Status: DC | PRN
  Administered 2012-12-19: .15 mg via INTRATHECAL

## 2012-12-19 MED ORDER — ONDANSETRON HCL 4 MG PO TABS: 4.0000 mg | ORAL_TABLET | ORAL | Status: DC | PRN

## 2012-12-19 MED ORDER — ZOLPIDEM TARTRATE 5 MG PO TABS: 5.0000 mg | ORAL_TABLET | Freq: Every evening | ORAL | Status: DC | PRN

## 2012-12-19 MED ORDER — NALOXONE HCL 1 MG/ML IJ SOLN
1.0000 ug/kg/h | INTRAVENOUS | Status: DC | PRN
  Filled 2012-12-19: qty 2

## 2012-12-19 MED ORDER — PHENYLEPHRINE 40 MCG/ML (10ML) SYRINGE FOR IV PUSH (FOR BLOOD PRESSURE SUPPORT)
PREFILLED_SYRINGE | INTRAVENOUS | Status: AC
  Filled 2012-12-19: qty 5

## 2012-12-19 MED ORDER — OXYCODONE-ACETAMINOPHEN 5-325 MG PO TABS: 1.0000 | ORAL_TABLET | ORAL | Status: DC | PRN

## 2012-12-19 MED ORDER — ONDANSETRON HCL 4 MG/2ML IJ SOLN
INTRAMUSCULAR | Status: AC
  Filled 2012-12-19: qty 2

## 2012-12-19 MED ORDER — DIPHENHYDRAMINE HCL 50 MG/ML IJ SOLN: 12.5000 mg | INTRAMUSCULAR | Status: DC | PRN

## 2012-12-19 SURGICAL SUPPLY — 27 items
BARRIER ADHS 3X4 INTERCEED (GAUZE/BANDAGES/DRESSINGS) IMPLANT
CLAMP CORD UMBIL (MISCELLANEOUS) IMPLANT
CLOTH BEACON ORANGE TIMEOUT ST (SAFETY) ×2 IMPLANT
DERMABOND ADVANCED (GAUZE/BANDAGES/DRESSINGS) ×1
DERMABOND ADVANCED .7 DNX12 (GAUZE/BANDAGES/DRESSINGS) ×1 IMPLANT
DRAPE LG THREE QUARTER DISP (DRAPES) ×2 IMPLANT
DRSG OPSITE POSTOP 4X10 (GAUZE/BANDAGES/DRESSINGS) ×2 IMPLANT
DURAPREP 26ML APPLICATOR (WOUND CARE) ×2 IMPLANT
ELECT REM PT RETURN 9FT ADLT (ELECTROSURGICAL) ×2
ELECTRODE REM PT RTRN 9FT ADLT (ELECTROSURGICAL) ×1 IMPLANT
EXTRACTOR VACUUM KIWI (MISCELLANEOUS) IMPLANT
GLOVE BIO SURGEON STRL SZ 6.5 (GLOVE) ×2 IMPLANT
GLOVE BIOGEL PI IND STRL 7.0 (GLOVE) ×1 IMPLANT
GLOVE BIOGEL PI INDICATOR 7.0 (GLOVE) ×1
GOWN STRL REIN XL XLG (GOWN DISPOSABLE) ×4 IMPLANT
KIT ABG SYR 3ML LUER SLIP (SYRINGE) IMPLANT
NEEDLE HYPO 25X5/8 SAFETYGLIDE (NEEDLE) IMPLANT
NS IRRIG 1000ML POUR BTL (IV SOLUTION) ×2 IMPLANT
PACK C SECTION WH (CUSTOM PROCEDURE TRAY) ×2 IMPLANT
PAD OB MATERNITY 4.3X12.25 (PERSONAL CARE ITEMS) ×2 IMPLANT
SUT VIC AB 0 CT1 36 (SUTURE) ×12 IMPLANT
SUT VIC AB 2-0 CT1 27 (SUTURE) ×1
SUT VIC AB 2-0 CT1 TAPERPNT 27 (SUTURE) ×1 IMPLANT
SUT VIC AB 4-0 PS2 27 (SUTURE) ×2 IMPLANT
TOWEL OR 17X24 6PK STRL BLUE (TOWEL DISPOSABLE) ×6 IMPLANT
TRAY FOLEY CATH 14FR (SET/KITS/TRAYS/PACK) IMPLANT
WATER STERILE IRR 1000ML POUR (IV SOLUTION) ×2 IMPLANT

## 2012-12-19 NOTE — H&P (Signed)
Agree with note and indication for cesarean section, breech.  Adam Phenix, MD 12/19/2012 10:47 AM

## 2012-12-19 NOTE — Transfer of Care (Signed)
Immediate Anesthesia Transfer of Care Note  Patient: Danielle Sawyer  Procedure(s) Performed: Procedure(s) with comments: CESAREAN SECTION (N/A) - primary  Patient Location: PACU  Anesthesia Type:Spinal  Level of Consciousness: awake, alert  and oriented  Airway & Oxygen Therapy: Patient Spontanous Breathing  Post-op Assessment: Report given to PACU RN and Post -op Vital signs reviewed and stable  Post vital signs: Reviewed and stable  Complications: No apparent anesthesia complications

## 2012-12-19 NOTE — Anesthesia Procedure Notes (Signed)
Spinal  Patient location during procedure: OR Start time: 12/19/2012 11:24 AM Preanesthetic Checklist Completed: patient identified, site marked, surgical consent, pre-op evaluation, timeout performed, IV checked, risks and benefits discussed and monitors and equipment checked Spinal Block Patient position: sitting Prep: site prepped and draped and DuraPrep Patient monitoring: heart rate, cardiac monitor, continuous pulse ox and blood pressure Approach: midline Location: L3-4 Injection technique: single-shot Needle Needle type: Sprotte  Needle gauge: 24 G Needle length: 9 cm Needle insertion depth: 5 cm Assessment Sensory level: T4 Events: paresthesia Additional Notes Patient tolerated procedure well. Adequate sensory level. Transient paresthesia left leg.

## 2012-12-19 NOTE — H&P (Signed)
Danielle Sawyer is a 32 y.o. female G1P0 at [redacted]w[redacted]d presenting for cesarean section for fetal malpresentation. She receives care at [facility] and has had no complications of this pregnancy. Declined genetic screen. Fetal pylectasis on early anatomy scan, resolved by 34 weeks. 1 hour GTT 92. Breech presentation. Attempted version at 37 weeks failed due to patient intolerance of procedure.   Denies contractions, loss of fluid, vaginal bleeding.  Maternal Medical History:  Fetal activity: Perceived fetal activity is normal.   Last perceived fetal movement was within the past hour.    Prenatal complications: no prenatal complications   OB History   Grav Para Term Preterm Abortions TAB SAB Ect Mult Living   1              Past Medical History  Diagnosis Date  . Medical history non-contributory    Past Surgical History  Procedure Laterality Date  . Wisdom tooth extraction      x3   Family History: family history includes Cancer in her maternal grandfather, maternal grandmother, paternal aunts, paternal grandfather, and paternal grandmother and Heart disease in her father. Social History:  reports that she has never smoked. She has never used smokeless tobacco. She reports that  drinks alcohol. She reports that she does not use illicit drugs.   Prenatal Transfer Tool  Maternal Diabetes: No Genetic Screening: Declined Maternal Ultrasounds/Referrals: Normal - resolved by 34 weeks Fetal Ultrasounds or other Referrals:  None Maternal Substance Abuse:  No Significant Maternal Medications:  None Significant Maternal Lab Results:  Lab values include: Group B Strep negative Other Comments:  fetal renal pylectasis early but resolved by 34 weeks  ROS See HPI   Blood pressure 123/79, pulse 61, temperature 98.2 F (36.8 C), temperature source Oral, resp. rate 18, last menstrual period 03/21/2012. Maternal Exam:  Abdomen: Patient reports no abdominal tenderness. Fetal presentation:  breech     Fetal Exam Fetal Monitor Review: Mode: hand-held doppler probe.   Baseline rate: 136.      Physical Exam   GEN:  WNWD, no distress HEENT:  NCAT, EOMI, conjunctiva clear NECK:  Supple, non-tender, no thyromegaly, trachea midline CV: RRR, no murmur RESP:  CTAB ABD:  Soft, non-tender, no guarding or rebound, normal bowel sounds EXTREM:  Warm, well perfused, no edema or tenderness NEURO:  Alert, oriented, no focal deficits  Prenatal labs: ABO, Rh: --/--/A POS (07/28 1347) Antibody: NEG (07/28 1347) Rubella: 1.41 (12/11 1450) RPR: NON REACTIVE (07/28 1347)  HBsAg: NEGATIVE (12/11 1450)  HIV: NON REACTIVE (05/09 2130)  GBS: Negative (07/08 0000)   Results for orders placed during the hospital encounter of 12/17/12 (from the past 72 hour(s))  ABO/RH     Status: None   Collection Time    12/17/12  1:45 PM      Result Value Range   ABO/RH(D) A POS    CBC     Status: Abnormal   Collection Time    12/17/12  1:46 PM      Result Value Range   WBC 13.5 (*) 4.0 - 10.5 K/uL   RBC 3.73 (*) 3.87 - 5.11 MIL/uL   Hemoglobin 12.1  12.0 - 15.0 g/dL   HCT 86.5 (*) 78.4 - 69.6 %   MCV 92.8  78.0 - 100.0 fL   MCH 32.4  26.0 - 34.0 pg   MCHC 35.0  30.0 - 36.0 g/dL   RDW 29.5  28.4 - 13.2 %   Platelets 219  150 - 400  K/uL  RPR     Status: None   Collection Time    12/17/12  1:47 PM      Result Value Range   RPR NON REACTIVE  NON REACTIVE  TYPE AND SCREEN     Status: None   Collection Time    12/17/12  1:47 PM      Result Value Range   ABO/RH(D) A POS     Antibody Screen NEG     Sample Expiration 12/20/2012       Assessment/Plan: 32 y.o. G1P0 at [redacted]w[redacted]d with fetal breech presentation. Confirmed with bedside ultrasound today in pre-op. - Admit for primary low transverse cesarean section.  The risks of cesarean section were discussed with the patient including but were not limited to: bleeding which may require transfusion or reoperation; infection which may require  antibiotics; injury to bowel, bladder, ureters or other surrounding organs; injury to the fetus; need for additional procedures including hysterectomy in the event of a life-threatening hemorrhage; placental abnormalities wth subsequent pregnancies, incisional problems, thromboembolic phenomenon and other postoperative/anesthesia complications.  The patient concurred with the proposed plan, giving informed written consent for the procedure.  Preoperative prophylactic antibiotics and SCDs ordered on call to the OR.  To OR when ready.   Napoleon Form 12/19/2012, 9:30 AM

## 2012-12-19 NOTE — Anesthesia Postprocedure Evaluation (Signed)
  Anesthesia Post-op Note  Patient: Danielle Sawyer  Procedure(s) Performed: Procedure(s) with comments: CESAREAN SECTION (N/A) - primary  Patient Location: PACU  Anesthesia Type:Spinal  Level of Consciousness: awake, alert  and oriented  Airway and Oxygen Therapy: Patient Spontanous Breathing  Post-op Pain: none  Post-op Assessment: Post-op Vital signs reviewed, Patient's Cardiovascular Status Stable, Respiratory Function Stable, Patent Airway, No signs of Nausea or vomiting, Pain level controlled, No headache and No backache  Post-op Vital Signs: Reviewed and stable  Complications: No apparent anesthesia complications

## 2012-12-19 NOTE — Anesthesia Preprocedure Evaluation (Signed)

## 2012-12-19 NOTE — Op Note (Signed)
Danielle Sawyer PROCEDURE DATE: 12/19/2012  PREOPERATIVE DIAGNOSIS: Intrauterine pregnancy at  [redacted]w[redacted]d weeks gestation; malpresentation: breech  POSTOPERATIVE DIAGNOSIS: The same  PROCEDURE: Primary/Repeat Low Transverse Cesarean Section  SURGEON: Adam Phenix, MD   ASSISTANT:  none   INDICATIONS: Danielle Sawyer is a 32 y.o. G1P1001 at [redacted]w[redacted]d here for cesarean section secondary to the indications listed under preoperative diagnosis; please see preoperative note for further details.  The risks of cesarean section were discussed with the patient including but were not limited to: bleeding which may require transfusion or reoperation; infection which may require antibiotics; injury to bowel, bladder, ureters or other surrounding organs; injury to the fetus; need for additional procedures including hysterectomy in the event of a life-threatening hemorrhage; placental abnormalities wth subsequent pregnancies, incisional problems, thromboembolic phenomenon and other postoperative/anesthesia complications.   The patient concurred with the proposed plan, giving informed written consent for the procedure.    FINDINGS:  Viable female infant in frank breech presentation.  Apgars 7 and 9.  Clear amniotic fluid.  Intact placenta, three vessel cord.  Normal uterus, fallopian tubes and ovaries bilaterally.  ANESTHESIA: Spinal INTRAVENOUS FLUIDS: 2300 ml ESTIMATED BLOOD LOSS: 600 ml URINE OUTPUT:  100 ml SPECIMENS: Placenta sent to L&D COMPLICATIONS: None immediate  PROCEDURE IN DETAIL:  The patient preoperatively received intravenous antibiotics and had sequential compression devices applied to her lower extremities.  She was then taken to the operating room where spinal anesthesia was administered and was found to be adequate. She was then placed in a dorsal supine position with a leftward tilt, and prepped and draped in a sterile manner.  A foley catheter was placed into her bladder and attached to  constant gravity.  After an adequate timeout was performed, a Pfannenstiel skin incision was made with scalpel and carried through to the underlying layer of fascia. The fascia was incised in the midline, and this incision was extended bilaterally using the Mayo scissors.  Kocher clamps were applied to the superior aspect of the fascial incision and the underlying rectus muscles were dissected off bluntly. A similar process was carried out on the inferior aspect of the fascial incision. The rectus muscles were separated in the midline bluntly and the peritoneum was entered bluntly. Attention was turned to the lower uterine segment where a low transverse hysterotomy incision was made with a scalpel and extended bilaterally bluntly.  The infant was successfully delivered, the cord was clamped and cut and the infant was handed over to awaiting neonatology team. Uterine massage was then administered, and the placenta delivered intact with a three-vessel cord. The uterus was then cleared of clot and debris.  The hysterotomy was closed with 0 Vicryl in a running locked fashion, and an imbricating layer was also placed with the same suture. The uterus was returned to the pelvis. The pelvis was irrigated and cleared of all clot and debris. Hemostasis was confirmed on all surfaces.  The peritoneum was reapproximated using 2-0 Vicryl in running stitch. The fascia was then closed using 0 Vicryl in a running fashion.  The subcutaneous layer was irrigated, and the skin was closed with a 4-0 Vicryl subcuticular stitch. Dermabond was applied to the incision. The patient tolerated the procedure well. Sponge, lap, instrument and needle counts were correct x 2.  She was taken to the recovery room in stable condition.   Napoleon Form, MD 12/19/2012 1:29 PM

## 2012-12-19 NOTE — Preoperative (Signed)
Beta Blockers   Reason not to administer Beta Blockers:Not Applicable 

## 2012-12-20 LAB — CBC
HCT: 30.9 % — ABNORMAL LOW (ref 36.0–46.0)
Hemoglobin: 10.7 g/dL — ABNORMAL LOW (ref 12.0–15.0)
MCV: 93.6 fL (ref 78.0–100.0)
WBC: 15.9 10*3/uL — ABNORMAL HIGH (ref 4.0–10.5)

## 2012-12-20 NOTE — Progress Notes (Signed)
Flat affect. States she is tired. Shows interest in baby. Good family support.refuses pain medication other than Ibuprofen. Ambulating in rm. Sitting in recliner. Baby poor feeder, LC worked with pt. And baby. Nipple shield provided and teaching done per LC.

## 2012-12-20 NOTE — Progress Notes (Signed)
Subjective: Postpartum Day 1: Cesarean Delivery Patient reports incisional pain, tolerating PO, + flatus and + BM.  Foley removed this AM, and has not voided as of yet  Objective: Vital signs in last 24 hours: Temp:  [97.4 F (36.3 C)-98.7 F (37.1 C)] 98.7 F (37.1 C) (07/31 0607) Pulse Rate:  [51-65] 52 (07/31 0607) Resp:  [11-20] 16 (07/31 0607) BP: (102-139)/(58-82) 118/65 mmHg (07/31 0607) SpO2:  [95 %-100 %] 98 % (07/31 0607) Weight:  [160 lb (72.576 kg)] 160 lb (72.576 kg) (07/30 1300)  Physical Exam:  General: alert, cooperative, appears stated age and no distress Lochia: appropriate Uterine Fundus: firm @U  Incision: no significant drainage, no dehiscence, no significant erythema DVT Evaluation: No evidence of DVT seen on physical exam. Negative Homan's sign. No cords or calf tenderness. No significant calf/ankle edema.   Recent Labs  12/17/12 1346 12/20/12 0600  HGB 12.1 10.7*  HCT 34.6* 30.9*    Assessment/Plan: Status post Cesarean section. Doing well postoperatively.  Continue current care Desires OCP for [facility] MOF: Breast ID: Tdap if indicated   Danielle Sawyer, RYAN 12/20/2012, 7:24 AM

## 2012-12-21 NOTE — Progress Notes (Signed)
Subjective: Postpartum Day 2: Cesarean Delivery Patient reports incisional pain, tolerating PO, + flatus, + BM and no problems voiding.    Objective: Vital signs in last 24 hours: Temp:  [97.7 F (36.5 C)-98.6 F (37 C)] 98.6 F (37 C) (08/01 0618) Pulse Rate:  [53-56] 53 (08/01 0618) Resp:  [16-18] 18 (08/01 0618) BP: (113-120)/(65-74) 113/65 mmHg (08/01 0618) SpO2:  [99 %] 99 % (08/01 0618)  Physical Exam:  General: alert, cooperative, appears stated age and no distress Lochia: appropriate Uterine Fundus: firm U-1   Incision: healing well, no significant drainage, no dehiscence, no significant erythema DVT Evaluation: No evidence of DVT seen on physical exam. Negative Homan's sign. No cords or calf tenderness. No significant calf/ankle edema. ctab no w.r.c rrr no mgt  Recent Labs  12/20/12 0600  HGB 10.7*  HCT 30.9*    Assessment/Plan: Status post Cesarean section. Doing well postoperatively.  Discharge home with standard precautions and return to clinic in 4-6 weeks. MOC OCP/minipill Breastfeeding Desires circ  Tawana Scale 12/21/2012, 8:01 AM

## 2012-12-21 NOTE — Progress Notes (Signed)
Subjective: Postpartum Day 2: Cesarean Delivery Patient reports tolerating PO, + flatus, + BM and no problems voiding.  Patient is ambulatory with some pain controlled with Motrin.  Would like to go home today if baby is breastfeeding well and no circumcision complications.  Objective: Vital signs in last 24 hours: Temp:  [97.7 F (36.5 C)-98.6 F (37 C)] 98.6 F (37 C) (08/01 0618) Pulse Rate:  [53-56] 53 (08/01 0618) Resp:  [16-18] 18 (08/01 0618) BP: (113-120)/(65-74) 113/65 mmHg (08/01 0618) SpO2:  [99 %] 99 % (08/01 0618)  Physical Exam:  General: alert, cooperative and no distress Lochia: appropriate Uterine Fundus: firm Incision: healing well, no significant drainage, no dehiscence, no significant erythema DVT Evaluation: No evidence of DVT seen on physical exam. Negative Homan's sign. No cords or calf tenderness. Calf/Ankle edema is present.   Recent Labs  12/20/12 0600  HGB 10.7*  HCT 30.9*    Assessment/Plan: Status post Cesarean section. Doing well postoperatively.  Discharge home with standard precautions and return to clinic in 4-6 weeks.  Garnette Czech 12/21/2012, 7:51 AM

## 2012-12-21 NOTE — Discharge Summary (Addendum)
Obstetric Discharge Summary Reason for Admission: cesarean section and for transferse lie Prenatal Procedures: none Intrapartum Procedures: cesarean: low cervical, transverse Postpartum Procedures: none Complications-Operative and Postpartum: none Hemoglobin  Date Value Range Status  12/20/2012 10.7* 12.0 - 15.0 g/dL Final     HCT  Date Value Range Status  12/20/2012 30.9* 36.0 - 46.0 % Final    Physical Exam:  General: alert, cooperative, appears stated age and no distress Lochia: appropriate Uterine Fundus: firm U-1 Incision: healing well, no significant drainage, no dehiscence, no significant erythema DVT Evaluation: No evidence of DVT seen on physical exam. Negative Homan's sign. No cords or calf tenderness. No significant calf/ankle edema.  Discharge Diagnoses: Term Pregnancy-delivered  Discharge Information: Date: 12/21/2012 Activity: pelvic rest and dont lift >20lbs for next 4 weeks Diet: routine Medications: PNV and Percocet Condition: stable Instructions: refer to practice specific booklet Discharge to: home Follow-up Information   Follow up with [facility] at [facility] In 4 weeks. (call for appt in4-6 wks)    Contact information:   [address] [place] [address] [phone]      Newborn Data: Live born female  Birth Weight: 6 lb 9.5 oz (2990 g) APGAR: 7, 9  Home with mother.  Danielle Sawyer, RYAN 12/21/2012, 9:45 AM

## 2012-12-21 NOTE — Progress Notes (Signed)
I spoke with and examined patient and agree with PA-S's note and plan of care. Plan to discharg today. Note completed by myself. Tawana Scale, MD Ob Fellow 12/21/2012 12:34 PM

## 2012-12-22 MED ORDER — IBUPROFEN 600 MG PO TABS: 600.0000 mg | ORAL_TABLET | Freq: Four times a day (QID) | ORAL | Status: DC

## 2012-12-22 MED ORDER — OXYCODONE-ACETAMINOPHEN 5-325 MG PO TABS: 1.0000 | ORAL_TABLET | ORAL | Status: DC | PRN

## 2012-12-22 NOTE — Discharge Summary (Signed)
Physician Discharge Summary  Patient ID: COLETTE DICAMILLO MRN: [id number] DOB/AGE: 32-Jan-1982 32 y.o.  Admit date: 12/19/2012 Discharge date: 12/22/2012  Admission Diagnoses: [redacted] weeks EGA, breech presentation for PLTCS  Discharge Diagnoses: same Active Problems:   * No active hospital problems. *   Discharged Condition: good  Hospital Course: She underwent an uncomplicated PLTCS and did well post partum. She had her son circumsized. She is having flatus, ambulating, voiding, and denies any problems. She is breastfeeding. She voiced her readiness for discharge home.   Consults: None  Significant Diagnostic Studies: labs: post op hemoglobin was 10.7.  Treatments: surgery: as above  Discharge Exam: Blood pressure 126/71, pulse 48, temperature 98.4 F (36.9 C), temperature source Oral, resp. rate 16, weight 72.576 kg (160 lb), last menstrual period 03/21/2012, SpO2 100.00%, unknown if currently breastfeeding. General appearance: alert Resp: clear to auscultation bilaterally Breasts: normal appearance, no masses or tenderness GI: soft, non-tender; bowel sounds normal; no masses,  no organomegaly Incision/Wound: honeycomb dressing c/d/i  Disposition: 01-Home or Self Care     Medication List         ibuprofen 600 MG tablet  Commonly known as:  ADVIL,MOTRIN  Take 1 tablet (600 mg total) by mouth every 6 (six) hours.     oxyCODONE-acetaminophen 5-325 MG per tablet  Commonly known as:  PERCOCET/ROXICET  Take 1-2 tablets by mouth every 4 (four) hours as needed.     prenatal multivitamin Tabs  Take 1 tablet by mouth daily at 12 noon.           Follow-up Information   Follow up with [facility] at [facility]. Schedule an appointment as soon as possible for a visit in 6 weeks. (call for appt in4-6 wks)    Contact information:   [address] [place] [address] [phone]      Signed: Allie Bossier. 12/22/2012, 8:45 AM

## 2013-01-28 ENCOUNTER — Encounter: Payer: Self-pay | Admitting: Family Medicine

## 2013-01-28 ENCOUNTER — Ambulatory Visit (INDEPENDENT_AMBULATORY_CARE_PROVIDER_SITE_OTHER): Payer: BC Managed Care – PPO | Admitting: Family Medicine

## 2013-01-28 NOTE — Patient Instructions (Signed)
Natural Family Planning  Natural Family Planning (NFP) is a type of birth control without using any form of contraception. Women who use NFP should not have sexual intercourse when the ovary produces an egg (ovulation) during the menstrual cycle. The NFP method is safe and can prevent pregnancy. It is 75% effective when practiced right. The man needs to also understand this method of birth control and the woman needs to be aware of how her body functions during her menstrual cycle. NFP can also be used as a method of getting pregnant.   HOW THE NFP METHOD WORKS  · A woman's menstrual period usually happens every 28 to 30 days (it can vary from 23 to 35 days).  · Ovulation happens 12 to 14 days before the start of the next menstrual period (the fertile period). The egg is fertile for 24 hours and the sperm can live for 3 days. If there is sexual intercourse at this time, pregnancy can occur.  THERE ARE MANY TYPES OF NFP METHODS USED TO PREVENT PREGNANCY  · The basal body temperature method. Often times, there is a slight increase of body temperature when a woman ovulates. Take your temperature every morning before getting out of bed. Write the temperature on a chart. An increase in the temperature shows ovulation has happened. Do not have sexual intercourse from the menstrual period up to three days after the increase in the temperature. Note that the body temperature may increase as a result of fever, restless sleep, and working schedules.  · The ovulation cervical mucus method. During the menstrual cycle, the cervical mucus changes from dry to sticky, to wet and slippery. Check the mucus of the vagina every day to look for these changes. Just before ovulation, the mucus becomes wet and slippery. On the last day of wetness, ovulation happens. To avoid getting pregnant, sexual intercourse is safe for about 10 days after the menstrual period and on the dry mucus days. Do not have sexual intercourse when the mucus  starts to show up and not until 4 days after the wet and slippery mucus goes away. Sexual intercourse after the 4 days have past until the menstrual period starts is a safe time. Note that the mucus from the vagina can increase because of a vaginal or cervical infection, lubricants, some medications, and sexual excitement.  · The symptothermal method. This method uses both the temperature and the ovulation methods. Combine the two methods above to prevent pregnancy.  · The calendar method.Record your menstrual periods and length of the cycles for 6 months. This is helpful when the menstrual cycle varies in the length of the cycle. The length of a menstrual cycle is from day 1 of the present menstrual period to day 1 of the next menstrual period. Then, find your fertile days of the month and do not have sexual intercourse during that time. You may need help from your caregiver to find out your fertile days.  There are some signs of ovulation that may be helpful when trying to find the time of ovulation. This includes vaginal spotting or abdominal cramps during the middle of your menstrual cycle. Not all women have these symptoms.  YOU SHOULD NOT USE NFP IF:  · You have very irregular menstrual periods and may skip months.  · You have medical problems and should not get pregnant.  · You have abnormal bleeding.  · You have a vaginal or cervical infection.  · You are on medicines that can affect   the vaginal mucus or body temperature.  Document Released: 10/26/2007 Document Revised: 08/01/2011 Document Reviewed: 10/26/2007  [employer]® Patient Information ©2014 [employer].

## 2013-01-28 NOTE — Progress Notes (Signed)
  Subjective:     Danielle Sawyer is a 32 y.o. female who presents for a postpartum visit. She is 6 weeks postpartum following a low cervical transverse Cesarean section. I have fully reviewed the prenatal and intrapartum course. The delivery was at 39 gestational weeks. Outcome: primary cesarean section, low transverse incision. Anesthesia: spinal. Postpartum course has been unremarkable. Baby's course has been normal. Baby is feeding by breast. Bleeding staining only. Bowel function is normal. Bladder function is normal. Patient is not sexually active. Contraception method is none. Postpartum depression screening: negative.  The following portions of the patient's history were reviewed and updated as appropriate: allergies, current medications, past family history, past medical history, past social history, past surgical history and problem list.  Review of Systems Pertinent items are noted in HPI.   Objective:    BP 120/89  Pulse 65  Ht 5\' 6"  (1.676 m)  Wt 140 lb (63.504 kg)  BMI 22.61 kg/m2  Breastfeeding? Yes  General:  alert, cooperative and appears stated age  Abdomen: soft, non-tender; bowel sounds normal; no masses,  no organomegaly   Vulva:  normal  Vagina: normal vagina  Cervix:  no cervical motion tenderness  Corpus: normal size, contour, position, consistency, mobility, non-tender  Adnexa:  normal adnexa        Assessment:     Normal postpartum exam. Pap smear not done at today's visit.   Plan:    1. Contraception: condoms 2.  Continue breast feeding. 3.  Return to work when ready.

## 2013-03-28 ENCOUNTER — Other Ambulatory Visit: Payer: Self-pay

## 2013-05-06 DIAGNOSIS — D239 Other benign neoplasm of skin, unspecified: Secondary | ICD-10-CM

## 2013-05-06 HISTORY — DX: Other benign neoplasm of skin, unspecified: D23.9

## 2014-02-17 ENCOUNTER — Ambulatory Visit (INDEPENDENT_AMBULATORY_CARE_PROVIDER_SITE_OTHER): Payer: BC Managed Care – PPO | Admitting: Obstetrics & Gynecology

## 2014-02-17 ENCOUNTER — Encounter: Payer: Self-pay | Admitting: Obstetrics & Gynecology

## 2014-02-17 VITALS — BP 113/67 | HR 65 | Wt 129.0 lb

## 2014-02-17 DIAGNOSIS — Z3480 Encounter for supervision of other normal pregnancy, unspecified trimester: Secondary | ICD-10-CM | POA: Diagnosis not present

## 2014-02-17 DIAGNOSIS — Z1151 Encounter for screening for human papillomavirus (HPV): Secondary | ICD-10-CM

## 2014-02-17 DIAGNOSIS — Z23 Encounter for immunization: Secondary | ICD-10-CM | POA: Diagnosis not present

## 2014-02-17 DIAGNOSIS — O34219 Maternal care for unspecified type scar from previous cesarean delivery: Secondary | ICD-10-CM

## 2014-02-17 DIAGNOSIS — Z113 Encounter for screening for infections with a predominantly sexual mode of transmission: Secondary | ICD-10-CM | POA: Diagnosis not present

## 2014-02-17 DIAGNOSIS — Z124 Encounter for screening for malignant neoplasm of cervix: Secondary | ICD-10-CM

## 2014-02-17 NOTE — Patient Instructions (Addendum)
Return to clinic for any obstetric concerns or go to [facility] for evaluation   Vaginal Birth After Cesarean Delivery Vaginal birth after cesarean delivery (VBAC) is giving birth vaginally after previously delivering a baby by a cesarean. In the past, if a woman had a cesarean delivery, all births afterward would be done by cesarean delivery. This is no longer true. It can be safe for the mother to try a vaginal delivery after having a cesarean delivery.  It is important to discuss VBAC with your health care provider early in the pregnancy so you can understand the risks, benefits, and options. It will give you time to decide what is best in your particular case. The final decision about whether to have a VBAC or repeat cesarean delivery should be between you and your health care provider. Any changes in your health or your baby's health during your pregnancy may make it necessary to change your initial decision about VBAC.  WOMEN WHO PLAN TO HAVE A VBAC SHOULD CHECK WITH THEIR HEALTH CARE PROVIDER TO BE SURE THAT:  The previous cesarean delivery was done with a low transverse uterine cut (incision) (not a vertical classical incision).   The birth canal is big enough for the baby.   There were no other operations on the uterus.   An electronic fetal monitor (EFM) will be on at all times during labor.   An operating room will be available and ready in case an emergency cesarean delivery is needed.   A health care provider and surgical nursing staff will be available at all times during labor to be ready to do an emergency delivery cesarean if necessary.   An anesthesiologist will be present in case an emergency cesarean delivery is needed.   The nursery is prepared and has adequate personnel and necessary equipment available to care for the baby in case of an emergency cesarean delivery. BENEFITS OF VBAC  Shorter stay in the hospital.   Avoidance of risks associated with cesarean  delivery, such as:  Surgical complications, such as opening of the incision or hernia in the incision.  Injury to other organs.  Fever. This can occur if an infection develops after surgery. It can also occur as a reaction to the medicine given to make you numb during the surgery.  Less blood loss and need for blood transfusions.  Lower risk of blood clots and infection.  Shorter recovery.   Decreased risk for having to remove the uterus (hysterectomy).   Decreased risk for the placenta to completely or partially cover the opening of the uterus (placenta previa) with a future pregnancy.   Decrease risk in future labor and delivery. RISKS OF A VBAC  Tearing (rupture) of the uterus. This is occurs in less than 1% of VBACs. The risk of this happening is higher if:  Steps are taken to begin the labor process (induce labor) or stimulate or strengthen contractions (augment labor).   Medicine is used to soften (ripen) the cervix.  Having to remove the uterus (hysterectomy) if it ruptures. VBAC SHOULD NOT BE DONE IF:  The previous cesarean delivery was done with a vertical (classical) or T-shaped incision or you do not know what kind of incision was made.   You had a ruptured uterus.   You have had certain types of surgery on your uterus, such as removal of uterine fibroids. Ask your health care provider about other types of surgeries that prevent you from having a VBAC.  You have certain medical  or childbirth (obstetrical) problems.   There are problems with the baby.   You have had two previous cesarean deliveries and no vaginal deliveries. OTHER FACTS TO KNOW ABOUT VBAC:  It is safe to have an epidural anesthetic with VBAC.   It is safe to turn the baby from a breech position (attempt an external cephalic version).   It is safe to try a VBAC with twins.   VBAC may not be successful if your baby weights 8.8 lb (4 kg) or more. However, weight predictions are not  always accurate and should not be used alone to decide if VBAC is right for you.  There is an increased failure rate if the time between the cesarean delivery and VBAC is less than 19 months.   Your health care provider may advise against a VBAC if you have preeclampsia (high blood pressure, protein in the urine, and swelling of face and extremities).   VBAC is often successful if you previously gave birth vaginally.   VBAC is often successful when the labor starts spontaneously before the due date.   Delivering a baby through a VBAC is similar to having a normal spontaneous vaginal delivery. Document Released: 10/30/2006 Document Revised: 09/23/2013 Document Reviewed: 12/06/2012 [facility] Patient Information 2015 [place], [place]. This information is not intended to replace advice given to you by your health care provider. Make sure you discuss any questions you have with your health care provider.  First Trimester of Pregnancy The first trimester of pregnancy is from week 1 until the end of week 12 (months 1 through 3). A week after a sperm fertilizes an egg, the egg will implant on the wall of the uterus. This embryo will begin to develop into a baby. Genes from you and your partner are forming the baby. The female genes determine whether the baby is a boy or a girl. At 6-8 weeks, the eyes and face are formed, and the heartbeat can be seen on ultrasound. At the end of 12 weeks, all the baby's organs are formed.  Now that you are pregnant, you will want to do everything you can to have a healthy baby. Two of the most important things are to get good prenatal care and to follow your health care provider's instructions. Prenatal care is all the medical care you receive before the baby's birth. This care will help prevent, find, and treat any problems during the pregnancy and childbirth. BODY CHANGES Your body goes through many changes during pregnancy. The changes vary from woman to woman.   You  may gain or lose a couple of pounds at first.  You may feel sick to your stomach (nauseous) and throw up (vomit). If the vomiting is uncontrollable, call your health care provider.  You may tire easily.  You may develop headaches that can be relieved by medicines approved by your health care provider.  You may urinate more often. Painful urination may mean you have a bladder infection.  You may develop heartburn as a result of your pregnancy.  You may develop constipation because certain hormones are causing the muscles that push waste through your intestines to slow down.  You may develop hemorrhoids or swollen, bulging veins (varicose veins).  Your breasts may begin to grow larger and become tender. Your nipples may stick out more, and the tissue that surrounds them (areola) may become darker.  Your gums may bleed and may be sensitive to brushing and flossing.  Dark spots or blotches (chloasma, mask of pregnancy)  may develop on your face. This will likely fade after the baby is born.  Your menstrual periods will stop.  You may have a loss of appetite.  You may develop cravings for certain kinds of food.  You may have changes in your emotions from day to day, such as being excited to be pregnant or being concerned that something may go wrong with the pregnancy and baby.  You may have more vivid and strange dreams.  You may have changes in your hair. These can include thickening of your hair, rapid growth, and changes in texture. Some women also have hair loss during or after pregnancy, or hair that feels dry or thin. Your hair will most likely return to normal after your baby is born. WHAT TO EXPECT AT YOUR PRENATAL VISITS During a routine prenatal visit:  You will be weighed to make sure you and the baby are growing normally.  Your blood pressure will be taken.  Your abdomen will be measured to track your baby's growth.  The fetal heartbeat will be listened to starting  around week 10 or 12 of your pregnancy.  Test results from any previous visits will be discussed. Your health care provider may ask you:  How you are feeling.  If you are feeling the baby move.  If you have had any abnormal symptoms, such as leaking fluid, bleeding, severe headaches, or abdominal cramping.  If you have any questions. Other tests that may be performed during your first trimester include:  Blood tests to find your blood type and to check for the presence of any previous infections. They will also be used to check for low iron levels (anemia) and Rh antibodies. Later in the pregnancy, blood tests for diabetes will be done along with other tests if problems develop.  Urine tests to check for infections, diabetes, or protein in the urine.  An ultrasound to confirm the proper growth and development of the baby.  An amniocentesis to check for possible genetic problems.  Fetal screens for spina bifida and Down syndrome.  You may need other tests to make sure you and the baby are doing well. HOME CARE INSTRUCTIONS  Medicines  Follow your health care provider's instructions regarding medicine use. Specific medicines may be either safe or unsafe to take during pregnancy.  Take your prenatal vitamins as directed.  If you develop constipation, try taking a stool softener if your health care provider approves. Diet  Eat regular, well-balanced meals. Choose a variety of foods, such as meat or vegetable-based protein, fish, milk and low-fat dairy products, vegetables, fruits, and whole grain breads and cereals. Your health care provider will help you determine the amount of weight gain that is right for you.  Avoid raw meat and uncooked cheese. These carry germs that can cause birth defects in the baby.  Eating four or five small meals rather than three large meals a day may help relieve nausea and vomiting. If you start to feel nauseous, eating a few soda crackers can be  helpful. Drinking liquids between meals instead of during meals also seems to help nausea and vomiting.  If you develop constipation, eat more high-fiber foods, such as fresh vegetables or fruit and whole grains. Drink enough fluids to keep your urine clear or pale yellow. Activity and Exercise  Exercise only as directed by your health care provider. Exercising will help you:  Control your weight.  Stay in shape.  Be prepared for labor and delivery.  Experiencing pain  or cramping in the lower abdomen or low back is a good sign that you should stop exercising. Check with your health care provider before continuing normal exercises.  Try to avoid standing for long periods of time. Move your legs often if you must stand in one place for a long time.  Avoid heavy lifting.  Wear low-heeled shoes, and practice good posture.  You may continue to have sex unless your health care provider directs you otherwise. Relief of Pain or Discomfort  Wear a good support bra for breast tenderness.   Take warm sitz baths to soothe any pain or discomfort caused by hemorrhoids. Use hemorrhoid cream if your health care provider approves.   Rest with your legs elevated if you have leg cramps or low back pain.  If you develop varicose veins in your legs, wear support hose. Elevate your feet for 15 minutes, 3-4 times a day. Limit salt in your diet. Prenatal Care  Schedule your prenatal visits by the twelfth week of pregnancy. They are usually scheduled monthly at first, then more often in the last 2 months before delivery.  Write down your questions. Take them to your prenatal visits.  Keep all your prenatal visits as directed by your health care provider. Safety  Wear your seat belt at all times when driving.  Make a list of emergency phone numbers, including numbers for family, friends, the hospital, and police and fire departments. General Tips  Ask your health care provider for a referral to  a local prenatal education class. Begin classes no later than at the beginning of month 6 of your pregnancy.  Ask for help if you have counseling or nutritional needs during pregnancy. Your health care provider can offer advice or refer you to specialists for help with various needs.  Do not use hot tubs, steam rooms, or saunas.  Do not douche or use tampons or scented sanitary pads.  Do not cross your legs for long periods of time.  Avoid cat litter boxes and soil used by cats. These carry germs that can cause birth defects in the baby and possibly loss of the fetus by miscarriage or stillbirth.  Avoid all smoking, herbs, alcohol, and medicines not prescribed by your health care provider. Chemicals in these affect the formation and growth of the baby.  Schedule a dentist appointment. At home, brush your teeth with a soft toothbrush and be gentle when you floss. SEEK MEDICAL CARE IF:   You have dizziness.  You have mild pelvic cramps, pelvic pressure, or nagging pain in the abdominal area.  You have persistent nausea, vomiting, or diarrhea.  You have a bad smelling vaginal discharge.  You have pain with urination.  You notice increased swelling in your face, hands, legs, or ankles. SEEK IMMEDIATE MEDICAL CARE IF:   You have a fever.  You are leaking fluid from your vagina.  You have spotting or bleeding from your vagina.  You have severe abdominal cramping or pain.  You have rapid weight gain or loss.  You vomit blood or material that looks like coffee grounds.  You are exposed to [place] measles and have never had them.  You are exposed to fifth disease or chickenpox.  You develop a severe headache.  You have shortness of breath.  You have any kind of trauma, such as from a fall or a car accident. Document Released: 05/03/2001 Document Revised: 09/23/2013 Document Reviewed: 03/19/2013 [facility] Patient Information 2015 [place], [place]. This information is not  intended  to replace advice given to you by your health care provider. Make sure you discuss any questions you have with your health care provider.  

## 2014-02-17 NOTE — Progress Notes (Signed)
    Subjective:    Danielle Sawyer is a 33 y.o.G2P1001 at [redacted]w[redacted]d by LMP consistent with clinic scan being seen today for her first obstetrical visit.  Her obstetrical history is significant for previous cesarean section at term for breech presentation, after unsuccessful ECV.  She desires TOLAC for this pregnancy. Patient does intend to breast feed, plans vasectomy for postpartum contraception. Pregnancy history fully reviewed.  Patient reports no complaints.  Filed Vitals:   02/17/14 1540  BP: 113/67  Pulse: 65  Weight: 129 lb (58.514 kg)    HISTORY: OB History  Gravida Para Term Preterm AB SAB TAB Ectopic Multiple Living  2 1 1       1     # Outcome Date GA Lbr Len/2nd Weight Sex Delivery Anes PTL Lv  2 CUR           1 TRM 12/19/12 [redacted]w[redacted]d  6 lb 9.5 oz (2.99 kg) M LTCS Spinal  Y     Past Medical History  Diagnosis Date  . Medical history non-contributory    Past Surgical History  Procedure Laterality Date  . Wisdom tooth extraction      x3  . Cesarean section N/A 12/19/2012    Procedure: CESAREAN SECTION;  Surgeon: Woodroe Mode, MD;  Location: [facility];  Service: Obstetrics;  Laterality: N/A;  primary   Family History  Problem Relation Age of Onset  . Heart disease Father     quad bypass surgery  . Cancer Paternal Aunt     pancreatic  . Cancer Maternal Grandmother     breast  . Cancer Maternal Grandfather     lung  . Cancer Paternal Grandmother     breast  . Cancer Paternal Grandfather   . Cancer Paternal Aunt      Exam    Uterus:     Pelvic Exam:    Perineum: No Hemorrhoids, Normal Perineum   Vulva: normal   Vagina:  normal mucosa, normal discharge   Cervix: no bleeding following Pap, no cervical motion tenderness and retroverted   Adnexa: normal adnexa and no mass, fullness, tenderness   Bony Pelvis: average  System: Breast:  normal appearance, no masses or tenderness, Inspection negative   Skin: normal coloration and turgor, no rashes   Neurologic:  oriented, normal   Extremities: normal strength, tone, and muscle mass   HEENT PERRLA and extra ocular movement intact   Mouth/Teeth mucous membranes moist, pharynx normal without lesions and dental hygiene good   Neck supple and no masses   Cardiovascular: regular rate and rhythm   Respiratory:  appears well, vitals normal, no respiratory distress, acyanotic, normal RR, chest clear, no wheezing, crepitations, rhonchi, normal symmetric air entry   Abdomen: soft, non-tender; bowel sounds normal; no masses,  no organomegaly   Urinary: urethral meatus normal   Clinic u/s: Viable IUP at [redacted]w[redacted]d   Assessment:    Pregnancy: G2P1001 Patient Active Problem List   Diagnosis Date Noted  . Previous cesarean delivery for breech presentation, antepartum 02/17/2014     Plan:   Initial labs drawn and Flu shot given today. Continue prenatal vitamins. Problem list reviewed and updated.  Patient given TOLAC form to review at home, will sign consent at later visit. Genetic Screening discussed: patient declines for now.  Ultrasound discussed; fetal survey: to be ordered later. Follow up in 4 weeks. Routine obstetric precautions reviewed.    Osborne Oman, MD 02/17/2014

## 2014-02-17 NOTE — Progress Notes (Signed)
Bedside ultrasound shows positive fetal heart rate and CRL of 6 weeks 2 days.  This is 5 days different from LMP dating.

## 2014-02-18 LAB — OBSTETRIC PANEL
Antibody Screen: NEGATIVE
BASOS PCT: 0 % (ref 0–1)
Basophils Absolute: 0 10*3/uL (ref 0.0–0.1)
EOS ABS: 0.2 10*3/uL (ref 0.0–0.7)
Eosinophils Relative: 2 % (ref 0–5)
HCT: 38.6 % (ref 36.0–46.0)
HEP B S AG: NEGATIVE
Hemoglobin: 12.7 g/dL (ref 12.0–15.0)
Lymphocytes Relative: 20 % (ref 12–46)
Lymphs Abs: 2.3 10*3/uL (ref 0.7–4.0)
MCH: 30.6 pg (ref 26.0–34.0)
MCHC: 32.9 g/dL (ref 30.0–36.0)
MCV: 93 fL (ref 78.0–100.0)
Monocytes Absolute: 0.7 10*3/uL (ref 0.1–1.0)
Monocytes Relative: 6 % (ref 3–12)
NEUTROS PCT: 72 % (ref 43–77)
Neutro Abs: 8.3 10*3/uL — ABNORMAL HIGH (ref 1.7–7.7)
PLATELETS: 346 10*3/uL (ref 150–400)
RBC: 4.15 MIL/uL (ref 3.87–5.11)
RDW: 12.1 % (ref 11.5–15.5)
Rh Type: POSITIVE
Rubella: 1.01 Index — ABNORMAL HIGH (ref <0.90)
WBC: 11.5 10*3/uL — ABNORMAL HIGH (ref 4.0–10.5)

## 2014-02-18 LAB — CULTURE, OB URINE
COLONY COUNT: NO GROWTH
ORGANISM ID, BACTERIA: NO GROWTH

## 2014-02-18 LAB — HIV ANTIBODY (ROUTINE TESTING W REFLEX): HIV: NONREACTIVE

## 2014-02-19 LAB — CYTOLOGY - PAP

## 2014-03-18 ENCOUNTER — Ambulatory Visit (INDEPENDENT_AMBULATORY_CARE_PROVIDER_SITE_OTHER): Payer: BC Managed Care – PPO | Admitting: Obstetrics & Gynecology

## 2014-03-18 ENCOUNTER — Encounter: Payer: Self-pay | Admitting: Obstetrics & Gynecology

## 2014-03-18 VITALS — BP 121/64 | HR 74 | Wt 132.4 lb

## 2014-03-18 DIAGNOSIS — O3421 Maternal care for scar from previous cesarean delivery: Secondary | ICD-10-CM

## 2014-03-18 DIAGNOSIS — O34219 Maternal care for unspecified type scar from previous cesarean delivery: Secondary | ICD-10-CM

## 2014-03-18 NOTE — Progress Notes (Signed)
Routine visit. Discussed genetics. At this time, she declines first screen. Some nausea, but no vomitting. No concerns.

## 2014-03-24 ENCOUNTER — Encounter: Payer: Self-pay | Admitting: Obstetrics & Gynecology

## 2014-04-15 ENCOUNTER — Ambulatory Visit (INDEPENDENT_AMBULATORY_CARE_PROVIDER_SITE_OTHER): Payer: BC Managed Care – PPO | Admitting: Family Medicine

## 2014-04-15 VITALS — BP 116/69 | HR 70 | Wt 135.0 lb

## 2014-04-15 DIAGNOSIS — Z3482 Encounter for supervision of other normal pregnancy, second trimester: Secondary | ICD-10-CM

## 2014-04-15 DIAGNOSIS — O34219 Maternal care for unspecified type scar from previous cesarean delivery: Secondary | ICD-10-CM

## 2014-04-15 DIAGNOSIS — O3421 Maternal care for scar from previous cesarean delivery: Secondary | ICD-10-CM

## 2014-04-15 NOTE — Patient Instructions (Signed)
Second Trimester of Pregnancy The second trimester is from week 13 through week 28, months 4 through 6. The second trimester is often a time when you feel your best. Your body has also adjusted to being pregnant, and you begin to feel better physically. Usually, morning sickness has lessened or quit completely, you may have more energy, and you may have an increase in appetite. The second trimester is also a time when the fetus is growing rapidly. At the end of the sixth month, the fetus is about 9 inches long and weighs about 1 pounds. You will likely begin to feel the baby move (quickening) between 18 and 20 weeks of the pregnancy. BODY CHANGES Your body goes through many changes during pregnancy. The changes vary from woman to woman.   Your weight will continue to increase. You will notice your lower abdomen bulging out.  You may begin to get stretch marks on your hips, abdomen, and breasts.  You may develop headaches that can be relieved by medicines approved by your health care provider.  You may urinate more often because the fetus is pressing on your bladder.  You may develop or continue to have heartburn as a result of your pregnancy.  You may develop constipation because certain hormones are causing the muscles that push waste through your intestines to slow down.  You may develop hemorrhoids or swollen, bulging veins (varicose veins).  You may have back pain because of the weight gain and pregnancy hormones relaxing your joints between the bones in your pelvis and as a result of a shift in weight and the muscles that support your balance.  Your breasts will continue to grow and be tender.  Your gums may bleed and may be sensitive to brushing and flossing.  Dark spots or blotches (chloasma, mask of pregnancy) may develop on your face. This will likely fade after the baby is born.  A dark line from your belly button to the pubic area (linea nigra) may appear. This will likely fade  after the baby is born.  You may have changes in your hair. These can include thickening of your hair, rapid growth, and changes in texture. Some women also have hair loss during or after pregnancy, or hair that feels dry or thin. Your hair will most likely return to normal after your baby is born. WHAT TO EXPECT AT YOUR PRENATAL VISITS During a routine prenatal visit:  You will be weighed to make sure you and the fetus are growing normally.  Your blood pressure will be taken.  Your abdomen will be measured to track your baby's growth.  The fetal heartbeat will be listened to.  Any test results from the previous visit will be discussed. Your health care provider may ask you:  How you are feeling.  If you are feeling the baby move.  If you have had any abnormal symptoms, such as leaking fluid, bleeding, severe headaches, or abdominal cramping.  If you have any questions. Other tests that may be performed during your second trimester include:  Blood tests that check for:  Low iron levels (anemia).  Gestational diabetes (between 24 and 28 weeks).  Rh antibodies.  Urine tests to check for infections, diabetes, or protein in the urine.  An ultrasound to confirm the proper growth and development of the baby.  An amniocentesis to check for possible genetic problems.  Fetal screens for spina bifida and Down syndrome. HOME CARE INSTRUCTIONS   Avoid all smoking, herbs, alcohol, and unprescribed   drugs. These chemicals affect the formation and growth of the baby.  Follow your health care provider's instructions regarding medicine use. There are medicines that are either safe or unsafe to take during pregnancy.  Exercise only as directed by your health care provider. Experiencing uterine cramps is a good sign to stop exercising.  Continue to eat regular, healthy meals.  Wear a good support bra for breast tenderness.  Do not use hot tubs, steam rooms, or saunas.  Wear your  seat belt at all times when driving.  Avoid raw meat, uncooked cheese, cat litter boxes, and soil used by cats. These carry germs that can cause birth defects in the baby.  Take your prenatal vitamins.  Try taking a stool softener (if your health care provider approves) if you develop constipation. Eat more high-fiber foods, such as fresh vegetables or fruit and whole grains. Drink plenty of fluids to keep your urine clear or pale yellow.  Take warm sitz baths to soothe any pain or discomfort caused by hemorrhoids. Use hemorrhoid cream if your health care provider approves.  If you develop varicose veins, wear support hose. Elevate your feet for 15 minutes, 3-4 times a day. Limit salt in your diet.  Avoid heavy lifting, wear low heel shoes, and practice good posture.  Rest with your legs elevated if you have leg cramps or low back pain.  Visit your dentist if you have not gone yet during your pregnancy. Use a soft toothbrush to brush your teeth and be gentle when you floss.  A sexual relationship may be continued unless your health care provider directs you otherwise.  Continue to go to all your prenatal visits as directed by your health care provider. SEEK MEDICAL CARE IF:   You have dizziness.  You have mild pelvic cramps, pelvic pressure, or nagging pain in the abdominal area.  You have persistent nausea, vomiting, or diarrhea.  You have a bad smelling vaginal discharge.  You have pain with urination. SEEK IMMEDIATE MEDICAL CARE IF:   You have a fever.  You are leaking fluid from your vagina.  You have spotting or bleeding from your vagina.  You have severe abdominal cramping or pain.  You have rapid weight gain or loss.  You have shortness of breath with chest pain.  You notice sudden or extreme swelling of your face, hands, ankles, feet, or legs.  You have not felt your baby move in over an hour.  You have severe headaches that do not go away with  medicine.  You have vision changes. Document Released: 05/03/2001 Document Revised: 05/14/2013 Document Reviewed: 07/10/2012 [employer] Patient Information 2015 [employer]. This information is not intended to replace advice given to you by your health care provider. Make sure you discuss any questions you have with your health care provider.  

## 2014-04-15 NOTE — Progress Notes (Signed)
Doing well--still declines genetics

## 2014-05-12 ENCOUNTER — Other Ambulatory Visit: Payer: Self-pay | Admitting: Family Medicine

## 2014-05-12 ENCOUNTER — Ambulatory Visit (HOSPITAL_COMMUNITY)
Admission: RE | Admit: 2014-05-12 | Discharge: 2014-05-12 | Disposition: A | Discharge status: Home / Self Care | Payer: BC Managed Care – PPO | Source: Ambulatory Visit | Attending: Family Medicine | Admitting: Family Medicine

## 2014-05-12 DIAGNOSIS — Z1389 Encounter for screening for other disorder: Secondary | ICD-10-CM | POA: Insufficient documentation

## 2014-05-12 DIAGNOSIS — O3421 Maternal care for scar from previous cesarean delivery: Secondary | ICD-10-CM | POA: Insufficient documentation

## 2014-05-12 DIAGNOSIS — Z3A19 19 weeks gestation of pregnancy: Secondary | ICD-10-CM | POA: Insufficient documentation

## 2014-05-12 DIAGNOSIS — O34219 Maternal care for unspecified type scar from previous cesarean delivery: Secondary | ICD-10-CM

## 2014-05-13 ENCOUNTER — Ambulatory Visit (INDEPENDENT_AMBULATORY_CARE_PROVIDER_SITE_OTHER): Payer: BC Managed Care – PPO | Admitting: Family Medicine

## 2014-05-13 ENCOUNTER — Encounter: Payer: Self-pay | Admitting: Family Medicine

## 2014-05-13 VITALS — BP 118/62 | HR 78 | Wt 137.0 lb

## 2014-05-13 DIAGNOSIS — Z3482 Encounter for supervision of other normal pregnancy, second trimester: Secondary | ICD-10-CM

## 2014-05-13 DIAGNOSIS — O4420 Partial placenta previa NOS or without hemorrhage, unspecified trimester: Secondary | ICD-10-CM | POA: Insufficient documentation

## 2014-05-13 DIAGNOSIS — O34219 Maternal care for unspecified type scar from previous cesarean delivery: Secondary | ICD-10-CM

## 2014-05-13 DIAGNOSIS — O3421 Maternal care for scar from previous cesarean delivery: Secondary | ICD-10-CM

## 2014-05-13 NOTE — Progress Notes (Signed)
Doing well--reviewed anatomy u/s and possible low-lying placenta and the implications of that.

## 2014-05-13 NOTE — Patient Instructions (Signed)
Second Trimester of Pregnancy The second trimester is from week 13 through week 28, months 4 through 6. The second trimester is often a time when you feel your best. Your body has also adjusted to being pregnant, and you begin to feel better physically. Usually, morning sickness has lessened or quit completely, you may have more energy, and you may have an increase in appetite. The second trimester is also a time when the fetus is growing rapidly. At the end of the sixth month, the fetus is about 9 inches long and weighs about 1 pounds. You will likely begin to feel the baby move (quickening) between 18 and 20 weeks of the pregnancy. BODY CHANGES Your body goes through many changes during pregnancy. The changes vary from woman to woman.   Your weight will continue to increase. You will notice your lower abdomen bulging out.  You may begin to get stretch marks on your hips, abdomen, and breasts.  You may develop headaches that can be relieved by medicines approved by your health care provider.  You may urinate more often because the fetus is pressing on your bladder.  You may develop or continue to have heartburn as a result of your pregnancy.  You may develop constipation because certain hormones are causing the muscles that push waste through your intestines to slow down.  You may develop hemorrhoids or swollen, bulging veins (varicose veins).  You may have back pain because of the weight gain and pregnancy hormones relaxing your joints between the bones in your pelvis and as a result of a shift in weight and the muscles that support your balance.  Your breasts will continue to grow and be tender.  Your gums may bleed and may be sensitive to brushing and flossing.  Dark spots or blotches (chloasma, mask of pregnancy) may develop on your face. This will likely fade after the baby is born.  A dark line from your belly button to the pubic area (linea nigra) may appear. This will likely  fade after the baby is born.  You may have changes in your hair. These can include thickening of your hair, rapid growth, and changes in texture. Some women also have hair loss during or after pregnancy, or hair that feels dry or thin. Your hair will most likely return to normal after your baby is born. WHAT TO EXPECT AT YOUR PRENATAL VISITS During a routine prenatal visit:  You will be weighed to make sure you and the fetus are growing normally.  Your blood pressure will be taken.  Your abdomen will be measured to track your baby's growth.  The fetal heartbeat will be listened to.  Any test results from the previous visit will be discussed. Your health care provider may ask you:  How you are feeling.  If you are feeling the baby move.  If you have had any abnormal symptoms, such as leaking fluid, bleeding, severe headaches, or abdominal cramping.  If you have any questions. Other tests that may be performed during your second trimester include:  Blood tests that check for:  Low iron levels (anemia).  Gestational diabetes (between 24 and 28 weeks).  Rh antibodies.  Urine tests to check for infections, diabetes, or protein in the urine.  An ultrasound to confirm the proper growth and development of the baby.  An amniocentesis to check for possible genetic problems.  Fetal screens for spina bifida and Down syndrome. HOME CARE INSTRUCTIONS   Avoid all smoking, herbs, alcohol, and unprescribed   drugs. These chemicals affect the formation and growth of the baby.  Follow your health care provider's instructions regarding medicine use. There are medicines that are either safe or unsafe to take during pregnancy.  Exercise only as directed by your health care provider. Experiencing uterine cramps is a good sign to stop exercising.  Continue to eat regular, healthy meals.  Wear a good support bra for breast tenderness.  Do not use hot tubs, steam rooms, or saunas.  Wear  your seat belt at all times when driving.  Avoid raw meat, uncooked cheese, cat litter boxes, and soil used by cats. These carry germs that can cause birth defects in the baby.  Take your prenatal vitamins.  Try taking a stool softener (if your health care provider approves) if you develop constipation. Eat more high-fiber foods, such as fresh vegetables or fruit and whole grains. Drink plenty of fluids to keep your urine clear or pale yellow.  Take warm sitz baths to soothe any pain or discomfort caused by hemorrhoids. Use hemorrhoid cream if your health care provider approves.  If you develop varicose veins, wear support hose. Elevate your feet for 15 minutes, 3-4 times a day. Limit salt in your diet.  Avoid heavy lifting, wear low heel shoes, and practice good posture.  Rest with your legs elevated if you have leg cramps or low back pain.  Visit your dentist if you have not gone yet during your pregnancy. Use a soft toothbrush to brush your teeth and be gentle when you floss.  A sexual relationship may be continued unless your health care provider directs you otherwise.  Continue to go to all your prenatal visits as directed by your health care provider. SEEK MEDICAL CARE IF:   You have dizziness.  You have mild pelvic cramps, pelvic pressure, or nagging pain in the abdominal area.  You have persistent nausea, vomiting, or diarrhea.  You have a bad smelling vaginal discharge.  You have pain with urination. SEEK IMMEDIATE MEDICAL CARE IF:   You have a fever.  You are leaking fluid from your vagina.  You have spotting or bleeding from your vagina.  You have severe abdominal cramping or pain.  You have rapid weight gain or loss.  You have shortness of breath with chest pain.  You notice sudden or extreme swelling of your face, hands, ankles, feet, or legs.  You have not felt your baby move in over an hour.  You have severe headaches that do not go away with  medicine.  You have vision changes. Document Released: 05/03/2001 Document Revised: 05/14/2013 Document Reviewed: 07/10/2012 [employer] Patient Information 2015 [employer]. This information is not intended to replace advice given to you by your health care provider. Make sure you discuss any questions you have with your health care provider.  Breastfeeding Deciding to breastfeed is one of the best choices you can make for you and your baby. A change in hormones during pregnancy causes your breast tissue to grow and increases the number and size of your milk ducts. These hormones also allow proteins, sugars, and fats from your blood supply to make breast milk in your milk-producing glands. Hormones prevent breast milk from being released before your baby is born as well as prompt milk flow after birth. Once breastfeeding has begun, thoughts of your baby, as well as his or her sucking or crying, can stimulate the release of milk from your milk-producing glands.  BENEFITS OF BREASTFEEDING For Your Baby  Your first   milk (colostrum) helps your baby's digestive system function better.   There are antibodies in your milk that help your baby fight off infections.   Your baby has a lower incidence of asthma, allergies, and sudden infant death syndrome.   The nutrients in breast milk are better for your baby than infant formulas and are designed uniquely for your baby's needs.   Breast milk improves your baby's brain development.   Your baby is less likely to develop other conditions, such as childhood obesity, asthma, or type 2 diabetes mellitus.  For You   Breastfeeding helps to create a very special bond between you and your baby.   Breastfeeding is convenient. Breast milk is always available at the correct temperature and costs nothing.   Breastfeeding helps to burn calories and helps you lose the weight gained during pregnancy.   Breastfeeding makes your uterus contract to its  prepregnancy size faster and slows bleeding (lochia) after you give birth.   Breastfeeding helps to lower your risk of developing type 2 diabetes mellitus, osteoporosis, and breast or ovarian cancer later in life. SIGNS THAT YOUR BABY IS HUNGRY Early Signs of Hunger  Increased alertness or activity.  Stretching.  Movement of the head from side to side.  Movement of the head and opening of the mouth when the corner of the mouth or cheek is stroked (rooting).  Increased sucking sounds, smacking lips, cooing, sighing, or squeaking.  Hand-to-mouth movements.  Increased sucking of fingers or hands. Late Signs of Hunger  Fussing.  Intermittent crying. Extreme Signs of Hunger Signs of extreme hunger will require calming and consoling before your baby will be able to breastfeed successfully. Do not wait for the following signs of extreme hunger to occur before you initiate breastfeeding:   Restlessness.  A loud, strong cry.   Screaming. BREASTFEEDING BASICS Breastfeeding Initiation  Find a comfortable place to sit or lie down, with your neck and back well supported.  Place a pillow or rolled up blanket under your baby to bring him or her to the level of your breast (if you are seated). Nursing pillows are specially designed to help support your arms and your baby while you breastfeed.  Make sure that your baby's abdomen is facing your abdomen.   Gently massage your breast. With your fingertips, massage from your chest wall toward your nipple in a circular motion. This encourages milk flow. You may need to continue this action during the feeding if your milk flows slowly.  Support your breast with 4 fingers underneath and your thumb above your nipple. Make sure your fingers are well away from your nipple and your baby's mouth.   Stroke your baby's lips gently with your finger or nipple.   When your baby's mouth is open wide enough, quickly bring your baby to your breast,  placing your entire nipple and as much of the colored area around your nipple (areola) as possible into your baby's mouth.   More areola should be visible above your baby's upper lip than below the lower lip.   Your baby's tongue should be between his or her lower gum and your breast.   Ensure that your baby's mouth is correctly positioned around your nipple (latched). Your baby's lips should create a seal on your breast and be turned out (everted).  It is common for your baby to suck about 2-3 minutes in order to start the flow of breast milk. Latching Teaching your baby how to latch on to your breast   properly is very important. An improper latch can cause nipple pain and decreased milk supply for you and poor weight gain in your baby. Also, if your baby is not latched onto your nipple properly, he or she may swallow some air during feeding. This can make your baby fussy. Burping your baby when you switch breasts during the feeding can help to get rid of the air. However, teaching your baby to latch on properly is still the best way to prevent fussiness from swallowing air while breastfeeding. Signs that your baby has successfully latched on to your nipple:    Silent tugging or silent sucking, without causing you pain.   Swallowing heard between every 3-4 sucks.    Muscle movement above and in front of his or her ears while sucking.  Signs that your baby has not successfully latched on to nipple:   Sucking sounds or smacking sounds from your baby while breastfeeding.  Nipple pain. If you think your baby has not latched on correctly, slip your finger into the corner of your baby's mouth to break the suction and place it between your baby's gums. Attempt breastfeeding initiation again. Signs of Successful Breastfeeding Signs from your baby:   A gradual decrease in the number of sucks or complete cessation of sucking.   Falling asleep.   Relaxation of his or her body.    Retention of a small amount of milk in his or her mouth.   Letting go of your breast by himself or herself. Signs from you:  Breasts that have increased in firmness, weight, and size 1-3 hours after feeding.   Breasts that are softer immediately after breastfeeding.  Increased milk volume, as well as a change in milk consistency and color by the fifth day of breastfeeding.   Nipples that are not sore, cracked, or bleeding. Signs That Your Baby is Getting Enough Milk  Wetting at least 3 diapers in a 24-hour period. The urine should be clear and pale yellow by age 5 days.  At least 3 stools in a 24-hour period by age 5 days. The stool should be soft and yellow.  At least 3 stools in a 24-hour period by age 7 days. The stool should be seedy and yellow.  No loss of weight greater than 10% of birth weight during the first 3 days of age.  Average weight gain of 4-7 ounces (113-198 g) per week after age 4 days.  Consistent daily weight gain by age 5 days, without weight loss after the age of 2 weeks. After a feeding, your baby may spit up a small amount. This is common. BREASTFEEDING FREQUENCY AND DURATION Frequent feeding will help you make more milk and can prevent sore nipples and breast engorgement. Breastfeed when you feel the need to reduce the fullness of your breasts or when your baby shows signs of hunger. This is called "breastfeeding on demand." Avoid introducing a pacifier to your baby while you are working to establish breastfeeding (the first 4-6 weeks after your baby is born). After this time you may choose to use a pacifier. Research has shown that pacifier use during the first year of a baby's life decreases the risk of sudden infant death syndrome (SIDS). Allow your baby to feed on each breast as long as he or she wants. Breastfeed until your baby is finished feeding. When your baby unlatches or falls asleep while feeding from the first breast, offer the second breast.  Because newborns are often sleepy in the   first few weeks of life, you may need to awaken your baby to get him or her to feed. Breastfeeding times will vary from baby to baby. However, the following rules can serve as a guide to help you ensure that your baby is properly fed:  Newborns (babies 4 weeks of age or younger) may breastfeed every 1-3 hours.  Newborns should not go longer than 3 hours during the day or 5 hours during the night without breastfeeding.  You should breastfeed your baby a minimum of 8 times in a 24-hour period until you begin to introduce solid foods to your baby at around 6 months of age. BREAST MILK PUMPING Pumping and storing breast milk allows you to ensure that your baby is exclusively fed your breast milk, even at times when you are unable to breastfeed. This is especially important if you are going back to work while you are still breastfeeding or when you are not able to be present during feedings. Your lactation consultant can give you guidelines on how long it is safe to store breast milk.  A breast pump is a machine that allows you to pump milk from your breast into a sterile bottle. The pumped breast milk can then be stored in a refrigerator or freezer. Some breast pumps are operated by hand, while others use electricity. Ask your lactation consultant which type will work best for you. Breast pumps can be purchased, but some hospitals and breastfeeding support groups lease breast pumps on a monthly basis. A lactation consultant can teach you how to hand express breast milk, if you prefer not to use a pump.  CARING FOR YOUR BREASTS WHILE YOU BREASTFEED Nipples can become dry, cracked, and sore while breastfeeding. The following recommendations can help keep your breasts moisturized and healthy:  Avoid using soap on your nipples.   Wear a supportive bra. Although not required, special nursing bras and tank tops are designed to allow access to your breasts for  breastfeeding without taking off your entire bra or top. Avoid wearing underwire-style bras or extremely tight bras.  Air dry your nipples for 3-4minutes after each feeding.   Use only cotton bra pads to absorb leaked breast milk. Leaking of breast milk between feedings is normal.   Use lanolin on your nipples after breastfeeding. Lanolin helps to maintain your skin's normal moisture barrier. If you use pure lanolin, you do not need to wash it off before feeding your baby again. Pure lanolin is not toxic to your baby. You may also hand express a few drops of breast milk and gently massage that milk into your nipples and allow the milk to air dry. In the first few weeks after giving birth, some women experience extremely full breasts (engorgement). Engorgement can make your breasts feel heavy, warm, and tender to the touch. Engorgement peaks within 3-5 days after you give birth. The following recommendations can help ease engorgement:  Completely empty your breasts while breastfeeding or pumping. You may want to start by applying warm, moist heat (in the shower or with warm water-soaked hand towels) just before feeding or pumping. This increases circulation and helps the milk flow. If your baby does not completely empty your breasts while breastfeeding, pump any extra milk after he or she is finished.  Wear a snug bra (nursing or regular) or tank top for 1-2 days to signal your body to slightly decrease milk production.  Apply ice packs to your breasts, unless this is too uncomfortable for you.    Make sure that your baby is latched on and positioned properly while breastfeeding. If engorgement persists after 48 hours of following these recommendations, contact your health care provider or a lactation consultant. OVERALL HEALTH CARE RECOMMENDATIONS WHILE BREASTFEEDING  Eat healthy foods. Alternate between meals and snacks, eating 3 of each per day. Because what you eat affects your breast milk,  some of the foods may make your baby more irritable than usual. Avoid eating these foods if you are sure that they are negatively affecting your baby.  Drink milk, fruit juice, and water to satisfy your thirst (about 10 glasses a day).   Rest often, relax, and continue to take your prenatal vitamins to prevent fatigue, stress, and anemia.  Continue breast self-awareness checks.  Avoid chewing and smoking tobacco.  Avoid alcohol and drug use. Some medicines that may be harmful to your baby can pass through breast milk. It is important to ask your health care provider before taking any medicine, including all over-the-counter and prescription medicine as well as vitamin and herbal supplements. It is possible to become pregnant while breastfeeding. If birth control is desired, ask your health care provider about options that will be safe for your baby. SEEK MEDICAL CARE IF:   You feel like you want to stop breastfeeding or have become frustrated with breastfeeding.  You have painful breasts or nipples.  Your nipples are cracked or bleeding.  Your breasts are red, tender, or warm.  You have a swollen area on either breast.  You have a fever or chills.  You have nausea or vomiting.  You have drainage other than breast milk from your nipples.  Your breasts do not become full before feedings by the fifth day after you give birth.  You feel sad and depressed.  Your baby is too sleepy to eat well.  Your baby is having trouble sleeping.   Your baby is wetting less than 3 diapers in a 24-hour period.  Your baby has less than 3 stools in a 24-hour period.  Your baby's skin or the white part of his or her eyes becomes yellow.   Your baby is not gaining weight by 5 days of age. SEEK IMMEDIATE MEDICAL CARE IF:   Your baby is overly tired (lethargic) and does not want to wake up and feed.  Your baby develops an unexplained fever. Document Released: 05/09/2005 Document Revised:  05/14/2013 Document Reviewed: 10/31/2012 [employer] Patient Information 2015 [employer]. This information is not intended to replace advice given to you by your health care provider. Make sure you discuss any questions you have with your health care provider.  

## 2014-05-23 NOTE — L&D Delivery Note (Signed)
34 y.o. G2P1001 at [redacted]w[redacted]d who presented with PROM, desiring TOLAC.  Delivery Note Patient's second stage of labor was characterized by rapid fetal descent to +3 station,  an initial prolonged deceleration, with return to baseline intermittently between contractions when patient started pushing. Neonatology team was called to bedside given recurrent fetal decelerations.   At 8:22 PM a female infant was delivered via cephalic presentation and was noted to be unresponsive; cord was urgently clamped and cut and infant was handed to Neonatology team who immediately started resuscitative efforts and the infant was immediately intubated. No heartbeat was detected, Code Apgar was called and CPR was initiated; please refer to the Neontalogist's notes for further details about the resuscitation.   APGARS: 0/0/0; infant's heart rate was noted after 12 minutes of intense resuscitation efforts.  Placenta status: Intact, spontaneous delivery with significant retroplacental blood noted during delivery concerning for abruption.   Cord: 3 vessels with the following complications: loose cord around leg x 1.Cord blood for pH and routine analysis was not able to be collected.  Anesthesia: Epidural  Lacerations:  2nd degree perineal and R vaginal wall Suture Repair: 3.0 vicryl Est. Blood Loss (mL):  700 ml including retroplacental blood.  Mom to postpartum.  Baby to NICU.  Osborne Oman, MD 10/09/2014, 9:30 PM

## 2014-06-09 ENCOUNTER — Ambulatory Visit (INDEPENDENT_AMBULATORY_CARE_PROVIDER_SITE_OTHER): Payer: BC Managed Care – PPO | Admitting: Family Medicine

## 2014-06-09 VITALS — BP 118/57 | HR 67 | Wt 144.0 lb

## 2014-06-09 DIAGNOSIS — O4420 Partial placenta previa NOS or without hemorrhage, unspecified trimester: Secondary | ICD-10-CM

## 2014-06-09 DIAGNOSIS — O34219 Maternal care for unspecified type scar from previous cesarean delivery: Secondary | ICD-10-CM

## 2014-06-09 DIAGNOSIS — O3421 Maternal care for scar from previous cesarean delivery: Secondary | ICD-10-CM

## 2014-06-09 DIAGNOSIS — O4410 Placenta previa with hemorrhage, unspecified trimester: Secondary | ICD-10-CM

## 2014-06-09 DIAGNOSIS — Z3482 Encounter for supervision of other normal pregnancy, second trimester: Secondary | ICD-10-CM

## 2014-06-09 NOTE — Patient Instructions (Signed)
Second Trimester of Pregnancy The second trimester is from week 13 through week 28, months 4 through 6. The second trimester is often a time when you feel your best. Your body has also adjusted to being pregnant, and you begin to feel better physically. Usually, morning sickness has lessened or quit completely, you may have more energy, and you may have an increase in appetite. The second trimester is also a time when the fetus is growing rapidly. At the end of the sixth month, the fetus is about 9 inches long and weighs about 1 pounds. You will likely begin to feel the baby move (quickening) between 18 and 20 weeks of the pregnancy. BODY CHANGES Your body goes through many changes during pregnancy. The changes vary from woman to woman.   Your weight will continue to increase. You will notice your lower abdomen bulging out.  You may begin to get stretch marks on your hips, abdomen, and breasts.  You may develop headaches that can be relieved by medicines approved by your health care provider.  You may urinate more often because the fetus is pressing on your bladder.  You may develop or continue to have heartburn as a result of your pregnancy.  You may develop constipation because certain hormones are causing the muscles that push waste through your intestines to slow down.  You may develop hemorrhoids or swollen, bulging veins (varicose veins).  You may have back pain because of the weight gain and pregnancy hormones relaxing your joints between the bones in your pelvis and as a result of a shift in weight and the muscles that support your balance.  Your breasts will continue to grow and be tender.  Your gums may bleed and may be sensitive to brushing and flossing.  Dark spots or blotches (chloasma, mask of pregnancy) may develop on your face. This will likely fade after the baby is born.  A dark line from your belly button to the pubic area (linea nigra) may appear. This will likely fade  after the baby is born.  You may have changes in your hair. These can include thickening of your hair, rapid growth, and changes in texture. Some women also have hair loss during or after pregnancy, or hair that feels dry or thin. Your hair will most likely return to normal after your baby is born. WHAT TO EXPECT AT YOUR PRENATAL VISITS During a routine prenatal visit:  You will be weighed to make sure you and the fetus are growing normally.  Your blood pressure will be taken.  Your abdomen will be measured to track your baby's growth.  The fetal heartbeat will be listened to.  Any test results from the previous visit will be discussed. Your health care provider may ask you:  How you are feeling.  If you are feeling the baby move.  If you have had any abnormal symptoms, such as leaking fluid, bleeding, severe headaches, or abdominal cramping.  If you have any questions. Other tests that may be performed during your second trimester include:  Blood tests that check for:  Low iron levels (anemia).  Gestational diabetes (between 24 and 28 weeks).  Rh antibodies.  Urine tests to check for infections, diabetes, or protein in the urine.  An ultrasound to confirm the proper growth and development of the baby.  An amniocentesis to check for possible genetic problems.  Fetal screens for spina bifida and Down syndrome. HOME CARE INSTRUCTIONS   Avoid all smoking, herbs, alcohol, and unprescribed   drugs. These chemicals affect the formation and growth of the baby.  Follow your health care provider's instructions regarding medicine use. There are medicines that are either safe or unsafe to take during pregnancy.  Exercise only as directed by your health care provider. Experiencing uterine cramps is a good sign to stop exercising.  Continue to eat regular, healthy meals.  Wear a good support bra for breast tenderness.  Do not use hot tubs, steam rooms, or saunas.  Wear your  seat belt at all times when driving.  Avoid raw meat, uncooked cheese, cat litter boxes, and soil used by cats. These carry germs that can cause birth defects in the baby.  Take your prenatal vitamins.  Try taking a stool softener (if your health care provider approves) if you develop constipation. Eat more high-fiber foods, such as fresh vegetables or fruit and whole grains. Drink plenty of fluids to keep your urine clear or pale yellow.  Take warm sitz baths to soothe any pain or discomfort caused by hemorrhoids. Use hemorrhoid cream if your health care provider approves.  If you develop varicose veins, wear support hose. Elevate your feet for 15 minutes, 3-4 times a day. Limit salt in your diet.  Avoid heavy lifting, wear low heel shoes, and practice good posture.  Rest with your legs elevated if you have leg cramps or low back pain.  Visit your dentist if you have not gone yet during your pregnancy. Use a soft toothbrush to brush your teeth and be gentle when you floss.  A sexual relationship may be continued unless your health care provider directs you otherwise.  Continue to go to all your prenatal visits as directed by your health care provider. SEEK MEDICAL CARE IF:   You have dizziness.  You have mild pelvic cramps, pelvic pressure, or nagging pain in the abdominal area.  You have persistent nausea, vomiting, or diarrhea.  You have a bad smelling vaginal discharge.  You have pain with urination. SEEK IMMEDIATE MEDICAL CARE IF:   You have a fever.  You are leaking fluid from your vagina.  You have spotting or bleeding from your vagina.  You have severe abdominal cramping or pain.  You have rapid weight gain or loss.  You have shortness of breath with chest pain.  You notice sudden or extreme swelling of your face, hands, ankles, feet, or legs.  You have not felt your baby move in over an hour.  You have severe headaches that do not go away with  medicine.  You have vision changes. Document Released: 05/03/2001 Document Revised: 05/14/2013 Document Reviewed: 07/10/2012 [employer] Patient Information 2015 [employer]. This information is not intended to replace advice given to you by your health care provider. Make sure you discuss any questions you have with your health care provider.  

## 2014-06-09 NOTE — Progress Notes (Signed)
Doing well--will f/u u/s for placenta location

## 2014-06-17 ENCOUNTER — Ambulatory Visit (HOSPITAL_COMMUNITY)
Admission: RE | Admit: 2014-06-17 | Discharge: 2014-06-17 | Disposition: A | Discharge status: Home / Self Care | Payer: BC Managed Care – PPO | Source: Ambulatory Visit | Attending: Family Medicine | Admitting: Family Medicine

## 2014-06-17 DIAGNOSIS — O4402 Placenta previa specified as without hemorrhage, second trimester: Secondary | ICD-10-CM | POA: Diagnosis present

## 2014-06-17 DIAGNOSIS — O4420 Partial placenta previa NOS or without hemorrhage, unspecified trimester: Secondary | ICD-10-CM

## 2014-06-17 DIAGNOSIS — Z3A24 24 weeks gestation of pregnancy: Secondary | ICD-10-CM | POA: Insufficient documentation

## 2014-06-17 DIAGNOSIS — O3421 Maternal care for scar from previous cesarean delivery: Secondary | ICD-10-CM | POA: Diagnosis not present

## 2014-07-07 ENCOUNTER — Encounter: Payer: BC Managed Care – PPO | Admitting: Obstetrics and Gynecology

## 2014-07-08 ENCOUNTER — Encounter: Payer: BC Managed Care – PPO | Admitting: Obstetrics & Gynecology

## 2014-07-16 ENCOUNTER — Encounter: Payer: Self-pay | Admitting: Physician Assistant

## 2014-07-16 ENCOUNTER — Ambulatory Visit (INDEPENDENT_AMBULATORY_CARE_PROVIDER_SITE_OTHER): Payer: BC Managed Care – PPO | Admitting: Physician Assistant

## 2014-07-16 VITALS — BP 107/72 | HR 98 | Wt 150.0 lb

## 2014-07-16 DIAGNOSIS — Z23 Encounter for immunization: Secondary | ICD-10-CM

## 2014-07-16 DIAGNOSIS — Z3493 Encounter for supervision of normal pregnancy, unspecified, third trimester: Secondary | ICD-10-CM

## 2014-07-16 DIAGNOSIS — Z36 Encounter for antenatal screening of mother: Secondary | ICD-10-CM

## 2014-07-16 DIAGNOSIS — O3421 Maternal care for scar from previous cesarean delivery: Secondary | ICD-10-CM

## 2014-07-16 DIAGNOSIS — Z3483 Encounter for supervision of other normal pregnancy, third trimester: Secondary | ICD-10-CM

## 2014-07-16 DIAGNOSIS — O34219 Maternal care for unspecified type scar from previous cesarean delivery: Secondary | ICD-10-CM

## 2014-07-16 LAB — CBC WITH DIFFERENTIAL/PLATELET
Basophils Absolute: 0 10*3/uL (ref 0.0–0.1)
Basophils Relative: 0 % (ref 0–1)
EOS ABS: 0.3 10*3/uL (ref 0.0–0.7)
Eosinophils Relative: 2 % (ref 0–5)
HEMATOCRIT: 35.1 % — AB (ref 36.0–46.0)
Hemoglobin: 11.9 g/dL — ABNORMAL LOW (ref 12.0–15.0)
LYMPHS ABS: 2.5 10*3/uL (ref 0.7–4.0)
LYMPHS PCT: 18 % (ref 12–46)
MCH: 31.6 pg (ref 26.0–34.0)
MCHC: 33.9 g/dL (ref 30.0–36.0)
MCV: 93.4 fL (ref 78.0–100.0)
MPV: 9.7 fL (ref 8.6–12.4)
Monocytes Absolute: 1 10*3/uL (ref 0.1–1.0)
Monocytes Relative: 7 % (ref 3–12)
NEUTROS ABS: 10.1 10*3/uL — AB (ref 1.7–7.7)
NEUTROS PCT: 73 % (ref 43–77)
Platelets: 280 10*3/uL (ref 150–400)
RBC: 3.76 MIL/uL — AB (ref 3.87–5.11)
RDW: 13.3 % (ref 11.5–15.5)
WBC: 13.8 10*3/uL — ABNORMAL HIGH (ref 4.0–10.5)

## 2014-07-16 NOTE — Progress Notes (Signed)
28 weeks, stable without complaint.  Endorses good fetal movement.  Denies LOF, vag bleeding, dysuria.   Needs u/s for growth now per last u/s.  28 week labs pending Tdap today PNV qd RTC 2 weeks

## 2014-07-16 NOTE — Patient Instructions (Signed)
Third Trimester of Pregnancy The third trimester is from week 29 through week 42, months 7 through 9. The third trimester is a time when the fetus is growing rapidly. At the end of the ninth month, the fetus is about 20 inches in length and weighs 6-10 pounds.  BODY CHANGES Your body goes through many changes during pregnancy. The changes vary from woman to woman.   Your weight will continue to increase. You can expect to gain 25-35 pounds (11-16 kg) by the end of the pregnancy.  You may begin to get stretch marks on your hips, abdomen, and breasts.  You may urinate more often because the fetus is moving lower into your pelvis and pressing on your bladder.  You may develop or continue to have heartburn as a result of your pregnancy.  You may develop constipation because certain hormones are causing the muscles that push waste through your intestines to slow down.  You may develop hemorrhoids or swollen, bulging veins (varicose veins).  You may have pelvic pain because of the weight gain and pregnancy hormones relaxing your joints between the bones in your pelvis. Backaches may result from overexertion of the muscles supporting your posture.  You may have changes in your hair. These can include thickening of your hair, rapid growth, and changes in texture. Some women also have hair loss during or after pregnancy, or hair that feels dry or thin. Your hair will most likely return to normal after your baby is born.  Your breasts will continue to grow and be tender. A yellow discharge may leak from your breasts called colostrum.  Your belly button may stick out.  You may feel short of breath because of your expanding uterus.  You may notice the fetus "dropping," or moving lower in your abdomen.  You may have a bloody mucus discharge. This usually occurs a few days to a week before labor begins.  Your cervix becomes thin and soft (effaced) near your due date. WHAT TO EXPECT AT YOUR PRENATAL  EXAMS  You will have prenatal exams every 2 weeks until week 36. Then, you will have weekly prenatal exams. During a routine prenatal visit:  You will be weighed to make sure you and the fetus are growing normally.  Your blood pressure is taken.  Your abdomen will be measured to track your baby's growth.  The fetal heartbeat will be listened to.  Any test results from the previous visit will be discussed.  You may have a cervical check near your due date to see if you have effaced. At around 36 weeks, your caregiver will check your cervix. At the same time, your caregiver will also perform a test on the secretions of the vaginal tissue. This test is to determine if a type of bacteria, Group B streptococcus, is present. Your caregiver will explain this further. Your caregiver may ask you:  What your birth plan is.  How you are feeling.  If you are feeling the baby move.  If you have had any abnormal symptoms, such as leaking fluid, bleeding, severe headaches, or abdominal cramping.  If you have any questions. Other tests or screenings that may be performed during your third trimester include:  Blood tests that check for low iron levels (anemia).  Fetal testing to check the health, activity level, and growth of the fetus. Testing is done if you have certain medical conditions or if there are problems during the pregnancy. FALSE LABOR You may feel small, irregular contractions that   eventually go away. These are called Braxton Hicks contractions, or false labor. Contractions may last for hours, days, or even weeks before true labor sets in. If contractions come at regular intervals, intensify, or become painful, it is best to be seen by your caregiver.  SIGNS OF LABOR   Menstrual-like cramps.  Contractions that are 5 minutes apart or less.  Contractions that start on the top of the uterus and spread down to the lower abdomen and back.  A sense of increased pelvic pressure or back  pain.  A watery or bloody mucus discharge that comes from the vagina. If you have any of these signs before the 37th week of pregnancy, call your caregiver right away. You need to go to the hospital to get checked immediately. HOME CARE INSTRUCTIONS   Avoid all smoking, herbs, alcohol, and unprescribed drugs. These chemicals affect the formation and growth of the baby.  Follow your caregiver's instructions regarding medicine use. There are medicines that are either safe or unsafe to take during pregnancy.  Exercise only as directed by your caregiver. Experiencing uterine cramps is a good sign to stop exercising.  Continue to eat regular, healthy meals.  Wear a good support bra for breast tenderness.  Do not use hot tubs, steam rooms, or saunas.  Wear your seat belt at all times when driving.  Avoid raw meat, uncooked cheese, cat litter boxes, and soil used by cats. These carry germs that can cause birth defects in the baby.  Take your prenatal vitamins.  Try taking a stool softener (if your caregiver approves) if you develop constipation. Eat more high-fiber foods, such as fresh vegetables or fruit and whole grains. Drink plenty of fluids to keep your urine clear or pale yellow.  Take warm sitz baths to soothe any pain or discomfort caused by hemorrhoids. Use hemorrhoid cream if your caregiver approves.  If you develop varicose veins, wear support hose. Elevate your feet for 15 minutes, 3-4 times a day. Limit salt in your diet.  Avoid heavy lifting, wear low heal shoes, and practice good posture.  Rest a lot with your legs elevated if you have leg cramps or low back pain.  Visit your dentist if you have not gone during your pregnancy. Use a soft toothbrush to brush your teeth and be gentle when you floss.  A sexual relationship may be continued unless your caregiver directs you otherwise.  Do not travel far distances unless it is absolutely necessary and only with the approval  of your caregiver.  Take prenatal classes to understand, practice, and ask questions about the labor and delivery.  Make a trial run to the hospital.  Pack your hospital bag.  Prepare the baby's nursery.  Continue to go to all your prenatal visits as directed by your caregiver. SEEK MEDICAL CARE IF:  You are unsure if you are in labor or if your water has broken.  You have dizziness.  You have mild pelvic cramps, pelvic pressure, or nagging pain in your abdominal area.  You have persistent nausea, vomiting, or diarrhea.  You have a bad smelling vaginal discharge.  You have pain with urination. SEEK IMMEDIATE MEDICAL CARE IF:   You have a fever.  You are leaking fluid from your vagina.  You have spotting or bleeding from your vagina.  You have severe abdominal cramping or pain.  You have rapid weight loss or gain.  You have shortness of breath with chest pain.  You notice sudden or extreme swelling   of your face, hands, ankles, feet, or legs.  You have not felt your baby move in over an hour.  You have severe headaches that do not go away with medicine.  You have vision changes. Document Released: 05/03/2001 Document Revised: 05/14/2013 Document Reviewed: 07/10/2012 [employer] Patient Information 2015 [employer]. This information is not intended to replace advice given to you by your health care provider. Make sure you discuss any questions you have with your health care provider.  

## 2014-07-17 LAB — RPR

## 2014-07-17 LAB — GLUCOSE TOLERANCE, 1 HOUR (50G) W/O FASTING: GLUCOSE 1 HOUR GTT: 100 mg/dL (ref 70–140)

## 2014-07-17 LAB — HIV ANTIBODY (ROUTINE TESTING W REFLEX): HIV 1&2 Ab, 4th Generation: NONREACTIVE

## 2014-07-24 ENCOUNTER — Ambulatory Visit (HOSPITAL_COMMUNITY)
Admission: RE | Admit: 2014-07-24 | Discharge: 2014-07-24 | Disposition: A | Discharge status: Home / Self Care | Payer: BC Managed Care – PPO | Source: Ambulatory Visit | Attending: Physician Assistant | Admitting: Physician Assistant

## 2014-07-24 DIAGNOSIS — O365930 Maternal care for other known or suspected poor fetal growth, third trimester, not applicable or unspecified: Secondary | ICD-10-CM | POA: Insufficient documentation

## 2014-07-24 DIAGNOSIS — IMO0002 Reserved for concepts with insufficient information to code with codable children: Secondary | ICD-10-CM | POA: Insufficient documentation

## 2014-07-24 DIAGNOSIS — O3421 Maternal care for scar from previous cesarean delivery: Secondary | ICD-10-CM | POA: Insufficient documentation

## 2014-07-24 DIAGNOSIS — Z3493 Encounter for supervision of normal pregnancy, unspecified, third trimester: Secondary | ICD-10-CM

## 2014-07-24 DIAGNOSIS — O4452 Low lying placenta with hemorrhage, second trimester: Secondary | ICD-10-CM | POA: Insufficient documentation

## 2014-07-24 DIAGNOSIS — Z3A29 29 weeks gestation of pregnancy: Secondary | ICD-10-CM | POA: Diagnosis not present

## 2014-07-24 DIAGNOSIS — O4403 Placenta previa specified as without hemorrhage, third trimester: Secondary | ICD-10-CM | POA: Diagnosis present

## 2014-08-05 ENCOUNTER — Encounter: Payer: BC Managed Care – PPO | Admitting: Obstetrics & Gynecology

## 2014-08-06 ENCOUNTER — Encounter: Payer: Self-pay | Admitting: Certified Nurse Midwife

## 2014-08-06 ENCOUNTER — Ambulatory Visit (INDEPENDENT_AMBULATORY_CARE_PROVIDER_SITE_OTHER): Payer: BC Managed Care – PPO | Admitting: Certified Nurse Midwife

## 2014-08-06 VITALS — BP 112/57 | HR 67 | Wt 159.0 lb

## 2014-08-06 DIAGNOSIS — O365930 Maternal care for other known or suspected poor fetal growth, third trimester, not applicable or unspecified: Secondary | ICD-10-CM

## 2014-08-06 DIAGNOSIS — IMO0002 Reserved for concepts with insufficient information to code with codable children: Secondary | ICD-10-CM

## 2014-08-06 DIAGNOSIS — Z3483 Encounter for supervision of other normal pregnancy, third trimester: Secondary | ICD-10-CM

## 2014-08-06 NOTE — Progress Notes (Signed)
FH size less than dates. Scheduled repeat growth scan. (3weeks). Reports good fetal movement. No complaints.

## 2014-08-06 NOTE — Patient Instructions (Signed)
How a Baby Grows During Pregnancy Pregnancy begins when the female's sperm enters the female's egg. This happens in the fallopian tube and is called fertilization. The fertilized egg is called an embryo until it reaches 9 weeks from the time of fertilization. From 9 weeks until birth it is called a fetus. The fertilized egg moves down the tube into the uterus and attaches to the inside lining of the uterus.  The pregnant woman is responsible for the growth of the embryo/fetus by supplying nourishment and oxygen through the blood stream and placenta to the developing fetus. The uterus becomes larger and pops out from the abdomen more and more as the fetus develops and grows. A normal pregnancy lasts 280 days, with a range of 259 to 294 days, or 40 weeks. The pregnancy is divided up into three trimesters:  First trimester - 0 to 13 weeks.  Second trimester - 14 to 27 weeks.  Third trimester - 28 to 40 weeks. The day your baby is supposed to be born is called estimated date of confinement (EDC) or estimated date of delivery (EDD). GROWTH OF THE BABY MONTH BY MONTH 1. First Month: The fertilized egg attaches to the inside of the uterus and certain cells will form the placenta and others will develop into the fetus. The arms, legs, brain, spinal cord, lungs, and heart begin to develop. At the end of the first month the heart begins to beat. The embryo weighs less than an ounce and is  inch long. 2. Second Month: The bones can be seen, the inner ear, eye lids, hands and feet form and genitals develop. By the end of 8 weeks, all of the major organs are developing. The fetus now weighs less than an ounce and is one inch (2.54 cm) long. 3. Third Month: Teeth buds appear, all the internal organs are forming, bones and muscles begin to grow, the spine can flex and the skin is transparent. Finger and toe nails begin to form, the hands develop faster than the feet and the arms are longer than the legs at this point.  The fetus weighs a little more than an ounce (0.03 kg) and is 3 inches (8.89cm) long. 4. Fourth Month: The placenta is completely formed. The external sex organs, neck, outer ear, eyebrows, eyelids and fingernails are formed. The fetus can hear, swallow, flex its arms and legs and the kidney begins to produce urine. The skin is covered with a white waxy coating (vernix) and very thin hair (lanugo) is present. The fetus weighs 5 ounces (0.14kg) and is 6 to 7 inches (16.51cm) long. 5. Fifth Month: The fetus moves around more and can be felt for the first time (called quickening), sleeps and wakes up at times, may begin to suck its finger and the nails grow to the end of the fingers. The gallbladder is now functioning and helps to digest the nutrients, eggs are formed in the female and the testicles begin to drop down from the abdomen to the scrotum in the female. The fetus weighs  to 1 pound (0.45kg) and is 10 inches (25.4cm) long. 6. Sixth Month: The lungs are formed but the fetus does not breath yet. The eyes open, the brain develops more quickly at this time, one can detect finger and toe prints and thicker hair grows. The fetus weighs 1 to 1 pounds (0.68kg) and is 12 inches (30.48cm) long. 7. Seventh Month: The fetus can hear and respond to sounds, kicks and stretches and can sense   changes in light. The fetus weighs 2 to 2 pounds (1.13kg) and is 14 inches (35.56cm) long. 8. Eight Month: All organs and body systems are fully developed and functioning. The bones get harder, taste buds develop and can taste sweet and sour flavors and the fetus may hiccup now. Different parts of the brain are developing and the skull remains soft for the brain to grow. The fetus weighs 5 pounds (2.27kg) and is 18 inches (45.75cm) long. 9. Ninth Month: The fetus gains about a half a pound a week, the lungs are fully developed, patterns of sleep develop and the head moves down into the bottom of the uterus called vertex. If the  buttocks moves into the bottom of the uterus, it is called a breech. The fetus weighs 6 to 9 pounds (2.72 to 4.08kg) and is 20 inches (50.8cm) long. You should be informed about your pregnancy, yourself and how the baby is developing as much as possible. Being informed helps you to enjoy this experience. It also gives you the sense to feel if something is not going right and when to ask questions. Talk to your caregiver when you have questions about your baby or your own body. Document Released: 10/26/2007 Document Revised: 08/01/2011 Document Reviewed: 10/26/2007 [employer] Patient Information 2015 [employer]. This information is not intended to replace advice given to you by your health care provider. Make sure you discuss any questions you have with your health care provider.  

## 2014-08-19 ENCOUNTER — Encounter: Payer: Self-pay | Admitting: Family Medicine

## 2014-08-19 ENCOUNTER — Ambulatory Visit (HOSPITAL_COMMUNITY)
Admission: RE | Admit: 2014-08-19 | Discharge: 2014-08-19 | Disposition: A | Discharge status: Home / Self Care | Payer: BC Managed Care – PPO | Source: Ambulatory Visit | Attending: Certified Nurse Midwife | Admitting: Certified Nurse Midwife

## 2014-08-19 ENCOUNTER — Encounter: Payer: BC Managed Care – PPO | Admitting: Family Medicine

## 2014-08-19 ENCOUNTER — Ambulatory Visit (INDEPENDENT_AMBULATORY_CARE_PROVIDER_SITE_OTHER): Payer: BC Managed Care – PPO | Admitting: Family Medicine

## 2014-08-19 VITALS — BP 112/70 | HR 68 | Wt 158.0 lb

## 2014-08-19 DIAGNOSIS — Z36 Encounter for antenatal screening of mother: Secondary | ICD-10-CM | POA: Insufficient documentation

## 2014-08-19 DIAGNOSIS — O26849 Uterine size-date discrepancy, unspecified trimester: Secondary | ICD-10-CM | POA: Insufficient documentation

## 2014-08-19 DIAGNOSIS — O26843 Uterine size-date discrepancy, third trimester: Secondary | ICD-10-CM | POA: Insufficient documentation

## 2014-08-19 DIAGNOSIS — Z3A33 33 weeks gestation of pregnancy: Secondary | ICD-10-CM | POA: Insufficient documentation

## 2014-08-19 DIAGNOSIS — O3421 Maternal care for scar from previous cesarean delivery: Secondary | ICD-10-CM | POA: Insufficient documentation

## 2014-08-19 DIAGNOSIS — IMO0002 Reserved for concepts with insufficient information to code with codable children: Secondary | ICD-10-CM

## 2014-08-19 DIAGNOSIS — Z3483 Encounter for supervision of other normal pregnancy, third trimester: Secondary | ICD-10-CM

## 2014-08-19 DIAGNOSIS — O34219 Maternal care for unspecified type scar from previous cesarean delivery: Secondary | ICD-10-CM

## 2014-08-19 NOTE — Patient Instructions (Signed)
Breastfeeding Deciding to breastfeed is one of the best choices you can make for you and your baby. A change in hormones during pregnancy causes your breast tissue to grow and increases the number and size of your milk ducts. These hormones also allow proteins, sugars, and fats from your blood supply to make breast milk in your milk-producing glands. Hormones prevent breast milk from being released before your baby is born as well as prompt milk flow after birth. Once breastfeeding has begun, thoughts of your baby, as well as his or her sucking or crying, can stimulate the release of milk from your milk-producing glands.  BENEFITS OF BREASTFEEDING For Your Baby  Your first milk (colostrum) helps your baby's digestive system function better.   There are antibodies in your milk that help your baby fight off infections.   Your baby has a lower incidence of asthma, allergies, and sudden infant death syndrome.   The nutrients in breast milk are better for your baby than infant formulas and are designed uniquely for your baby's needs.   Breast milk improves your baby's brain development.   Your baby is less likely to develop other conditions, such as childhood obesity, asthma, or type 2 diabetes mellitus.  For You   Breastfeeding helps to create a very special bond between you and your baby.   Breastfeeding is convenient. Breast milk is always available at the correct temperature and costs nothing.   Breastfeeding helps to burn calories and helps you lose the weight gained during pregnancy.   Breastfeeding makes your uterus contract to its prepregnancy size faster and slows bleeding (lochia) after you give birth.   Breastfeeding helps to lower your risk of developing type 2 diabetes mellitus, osteoporosis, and breast or ovarian cancer later in life. SIGNS THAT YOUR BABY IS HUNGRY Early Signs of Hunger  Increased alertness or activity.  Stretching.  Movement of the head from  side to side.  Movement of the head and opening of the mouth when the corner of the mouth or cheek is stroked (rooting).  Increased sucking sounds, smacking lips, cooing, sighing, or squeaking.  Hand-to-mouth movements.  Increased sucking of fingers or hands. Late Signs of Hunger  Fussing.  Intermittent crying. Extreme Signs of Hunger Signs of extreme hunger will require calming and consoling before your baby will be able to breastfeed successfully. Do not wait for the following signs of extreme hunger to occur before you initiate breastfeeding:   Restlessness.  A loud, strong cry.   Screaming. BREASTFEEDING BASICS Breastfeeding Initiation  Find a comfortable place to sit or lie down, with your neck and back well supported.  Place a pillow or rolled up blanket under your baby to bring him or her to the level of your breast (if you are seated). Nursing pillows are specially designed to help support your arms and your baby while you breastfeed.  Make sure that your baby's abdomen is facing your abdomen.   Gently massage your breast. With your fingertips, massage from your chest wall toward your nipple in a circular motion. This encourages milk flow. You may need to continue this action during the feeding if your milk flows slowly.  Support your breast with 4 fingers underneath and your thumb above your nipple. Make sure your fingers are well away from your nipple and your baby's mouth.   Stroke your baby's lips gently with your finger or nipple.   When your baby's mouth is open wide enough, quickly bring your baby to your  breast, placing your entire nipple and as much of the colored area around your nipple (areola) as possible into your baby's mouth.   More areola should be visible above your baby's upper lip than below the lower lip.   Your baby's tongue should be between his or her lower gum and your breast.   Ensure that your baby's mouth is correctly positioned  around your nipple (latched). Your baby's lips should create a seal on your breast and be turned out (everted).  It is common for your baby to suck about 2-3 minutes in order to start the flow of breast milk. Latching Teaching your baby how to latch on to your breast properly is very important. An improper latch can cause nipple pain and decreased milk supply for you and poor weight gain in your baby. Also, if your baby is not latched onto your nipple properly, he or she may swallow some air during feeding. This can make your baby fussy. Burping your baby when you switch breasts during the feeding can help to get rid of the air. However, teaching your baby to latch on properly is still the best way to prevent fussiness from swallowing air while breastfeeding. Signs that your baby has successfully latched on to your nipple:    Silent tugging or silent sucking, without causing you pain.   Swallowing heard between every 3-4 sucks.    Muscle movement above and in front of his or her ears while sucking.  Signs that your baby has not successfully latched on to nipple:   Sucking sounds or smacking sounds from your baby while breastfeeding.  Nipple pain. If you think your baby has not latched on correctly, slip your finger into the corner of your baby's mouth to break the suction and place it between your baby's gums. Attempt breastfeeding initiation again. Signs of Successful Breastfeeding Signs from your baby:   A gradual decrease in the number of sucks or complete cessation of sucking.   Falling asleep.   Relaxation of his or her body.   Retention of a small amount of milk in his or her mouth.   Letting go of your breast by himself or herself. Signs from you:  Breasts that have increased in firmness, weight, and size 1-3 hours after feeding.   Breasts that are softer immediately after breastfeeding.  Increased milk volume, as well as a change in milk consistency and color by  the fifth day of breastfeeding.   Nipples that are not sore, cracked, or bleeding. Signs That Your [employer] is Getting Enough Milk  Wetting at least 3 diapers in a 24-hour period. The urine should be clear and pale yellow by age 11 days.  At least 3 stools in a 24-hour period by age 11 days. The stool should be soft and yellow.  At least 3 stools in a 24-hour period by age 18 days. The stool should be seedy and yellow.  No loss of weight greater than 10% of birth weight during the first 55 days of age.  Average weight gain of 4-7 ounces (113-198 g) per week after age 36 days.  Consistent daily weight gain by age 9 days, without weight loss after the age of 2 weeks. After a feeding, your baby may spit up a small amount. This is common. BREASTFEEDING FREQUENCY AND DURATION Frequent feeding will help you make more milk and can prevent sore nipples and breast engorgement. Breastfeed when you feel the need to reduce the fullness of your breasts  or when your baby shows signs of hunger. This is called "breastfeeding on demand." Avoid introducing a pacifier to your baby while you are working to establish breastfeeding (the first 4-6 weeks after your baby is born). After this time you may choose to use a pacifier. Research has shown that pacifier use during the first year of a baby's life decreases the risk of sudden infant death syndrome (SIDS). Allow your baby to feed on each breast as long as he or she wants. Breastfeed until your baby is finished feeding. When your baby unlatches or falls asleep while feeding from the first breast, offer the second breast. Because newborns are often sleepy in the first few weeks of life, you may need to awaken your baby to get him or her to feed. Breastfeeding times will vary from baby to baby. However, the following rules can serve as a guide to help you ensure that your baby is properly fed:  Newborns (babies 5 weeks of age or younger) may breastfeed every 1-3  hours.  Newborns should not go longer than 3 hours during the day or 5 hours during the night without breastfeeding.  You should breastfeed your baby a minimum of 8 times in a 24-hour period until you begin to introduce solid foods to your baby at around 54 months of age. BREAST MILK PUMPING Pumping and storing breast milk allows you to ensure that your baby is exclusively fed your breast milk, even at times when you are unable to breastfeed. This is especially important if you are going back to work while you are still breastfeeding or when you are not able to be present during feedings. Your lactation consultant can give you guidelines on how long it is safe to store breast milk.  A breast pump is a machine that allows you to pump milk from your breast into a sterile bottle. The pumped breast milk can then be stored in a refrigerator or freezer. Some breast pumps are operated by hand, while others use electricity. Ask your lactation consultant which type will work best for you. Breast pumps can be purchased, but some hospitals and breastfeeding support groups lease breast pumps on a monthly basis. A lactation consultant can teach you how to hand express breast milk, if you prefer not to use a pump.  CARING FOR YOUR BREASTS WHILE YOU BREASTFEED Nipples can become dry, cracked, and sore while breastfeeding. The following recommendations can help keep your breasts moisturized and healthy:  Avoid using soap on your nipples.   Wear a supportive bra. Although not required, special nursing bras and tank tops are designed to allow access to your breasts for breastfeeding without taking off your entire bra or top. Avoid wearing underwire-style bras or extremely tight bras.  Air dry your nipples for 3-98minutes after each feeding.   Use only cotton bra pads to absorb leaked breast milk. Leaking of breast milk between feedings is normal.   Use lanolin on your nipples after breastfeeding. Lanolin helps to  maintain your skin's normal moisture barrier. If you use pure lanolin, you do not need to wash it off before feeding your baby again. Pure lanolin is not toxic to your baby. You may also hand express a few drops of breast milk and gently massage that milk into your nipples and allow the milk to air dry. In the first few weeks after giving birth, some women experience extremely full breasts (engorgement). Engorgement can make your breasts feel heavy, warm, and tender to the  touch. Engorgement peaks within 3-5 days after you give birth. The following recommendations can help ease engorgement:  Completely empty your breasts while breastfeeding or pumping. You may want to start by applying warm, moist heat (in the shower or with warm water-soaked hand towels) just before feeding or pumping. This increases circulation and helps the milk flow. If your baby does not completely empty your breasts while breastfeeding, pump any extra milk after he or she is finished.  Wear a snug bra (nursing or regular) or tank top for 1-2 days to signal your body to slightly decrease milk production.  Apply ice packs to your breasts, unless this is too uncomfortable for you.  Make sure that your baby is latched on and positioned properly while breastfeeding. If engorgement persists after 48 hours of following these recommendations, contact your health care provider or a Science writer. OVERALL HEALTH CARE RECOMMENDATIONS WHILE BREASTFEEDING  Eat healthy foods. Alternate between meals and snacks, eating 3 of each per day. Because what you eat affects your breast milk, some of the foods may make your baby more irritable than usual. Avoid eating these foods if you are sure that they are negatively affecting your baby.  Drink milk, fruit juice, and water to satisfy your thirst (about 10 glasses a day).   Rest often, relax, and continue to take your prenatal vitamins to prevent fatigue, stress, and anemia.  Continue  breast self-awareness checks.  Avoid chewing and smoking tobacco.  Avoid alcohol and drug use. Some medicines that may be harmful to your baby can pass through breast milk. It is important to ask your health care provider before taking any medicine, including all over-the-counter and prescription medicine as well as vitamin and herbal supplements. It is possible to become pregnant while breastfeeding. If birth control is desired, ask your health care provider about options that will be safe for your baby. SEEK MEDICAL CARE IF:   You feel like you want to stop breastfeeding or have become frustrated with breastfeeding.  You have painful breasts or nipples.  Your nipples are cracked or bleeding.  Your breasts are red, tender, or warm.  You have a swollen area on either breast.  You have a fever or chills.  You have nausea or vomiting.  You have drainage other than breast milk from your nipples.  Your breasts do not become full before feedings by the fifth day after you give birth.  You feel sad and depressed.  Your baby is too sleepy to eat well.  Your baby is having trouble sleeping.   Your baby is wetting less than 3 diapers in a 24-hour period.  Your baby has less than 3 stools in a 24-hour period.  Your baby's skin or the white part of his or her eyes becomes yellow.   Your baby is not gaining weight by 55 days of age. SEEK IMMEDIATE MEDICAL CARE IF:   Your baby is overly tired (lethargic) and does not want to wake up and feed.  Your baby develops an unexplained fever. Document Released: 05/09/2005 Document Revised: 05/14/2013 Document Reviewed: 10/31/2012 [facility] Patient Information 2015 [place], [place]. This information is not intended to replace advice given to you by your health care provider. Make sure you discuss any questions you have with your health care provider.  Vaginal Birth After Cesarean Delivery Vaginal birth after cesarean delivery (VBAC) is  giving birth vaginally after previously delivering a baby by a cesarean. In the past, if a woman had a cesarean delivery,  all births afterward would be done by cesarean delivery. This is no longer true. It can be safe for the mother to try a vaginal delivery after having a cesarean delivery.  It is important to discuss VBAC with your health care provider early in the pregnancy so you can understand the risks, benefits, and options. It will give you time to decide what is best in your particular case. The final decision about whether to have a VBAC or repeat cesarean delivery should be between you and your health care provider. Any changes in your health or your baby's health during your pregnancy may make it necessary to change your initial decision about VBAC.  WOMEN WHO PLAN TO HAVE A VBAC SHOULD CHECK WITH THEIR HEALTH CARE PROVIDER TO BE SURE THAT:  The previous cesarean delivery was done with a low transverse uterine cut (incision) (not a vertical classical incision).   The birth canal is big enough for the baby.   There were no other operations on the uterus.   An electronic fetal monitor (EFM) will be on at all times during labor.   An operating room will be available and ready in case an emergency cesarean delivery is needed.   A health care provider and surgical nursing staff will be available at all times during labor to be ready to do an emergency delivery cesarean if necessary.   An anesthesiologist will be present in case an emergency cesarean delivery is needed.   The nursery is prepared and has adequate personnel and necessary equipment available to care for the baby in case of an emergency cesarean delivery. BENEFITS OF VBAC  Shorter stay in the hospital.   Avoidance of risks associated with cesarean delivery, such as:  Surgical complications, such as opening of the incision or hernia in the incision.  Injury to other organs.  Fever. This can occur if an infection  develops after surgery. It can also occur as a reaction to the medicine given to make you numb during the surgery.  Less blood loss and need for blood transfusions.  Lower risk of blood clots and infection.  Shorter recovery.   Decreased risk for having to remove the uterus (hysterectomy).   Decreased risk for the placenta to completely or partially cover the opening of the uterus (placenta previa) with a future pregnancy.   Decrease risk in future labor and delivery. RISKS OF A VBAC  Tearing (rupture) of the uterus. This is occurs in less than 1% of VBACs. The risk of this happening is higher if:  Steps are taken to begin the labor process (induce labor) or stimulate or strengthen contractions (augment labor).   Medicine is used to soften (ripen) the cervix.  Having to remove the uterus (hysterectomy) if it ruptures. VBAC SHOULD NOT BE DONE IF:  The previous cesarean delivery was done with a vertical (classical) or T-shaped incision or you do not know what kind of incision was made.   You had a ruptured uterus.   You have had certain types of surgery on your uterus, such as removal of uterine fibroids. Ask your health care provider about other types of surgeries that prevent you from having a VBAC.  You have certain medical or childbirth (obstetrical) problems.   There are problems with the baby.   You have had two previous cesarean deliveries and no vaginal deliveries. OTHER FACTS TO KNOW ABOUT VBAC:  It is safe to have an epidural anesthetic with VBAC.   It is safe to  turn the baby from a breech position (attempt an external cephalic version).   It is safe to try a VBAC with twins.   VBAC may not be successful if your baby weights 8.8 lb (4 kg) or more. However, weight predictions are not always accurate and should not be used alone to decide if VBAC is right for you.  There is an increased failure rate if the time between the cesarean delivery and VBAC is  less than 19 months.   Your health care provider may advise against a VBAC if you have preeclampsia (high blood pressure, protein in the urine, and swelling of face and extremities).   VBAC is often successful if you previously gave birth vaginally.   VBAC is often successful when the labor starts spontaneously before the due date.   Delivering a baby through a VBAC is similar to having a normal spontaneous vaginal delivery. Document Released: 10/30/2006 Document Revised: 09/23/2013 Document Reviewed: 12/06/2012 [facility] Patient Information 2015 [place], [place]. This information is not intended to replace advice given to you by your health care provider. Make sure you discuss any questions you have with your health care provider.

## 2014-08-19 NOTE — Progress Notes (Signed)
U/S today--per pt. appropriate growth. No further u/s needed.--she just measures smaller than dates. Reports excellent FM VBAC consent signed today

## 2014-08-20 ENCOUNTER — Encounter: Payer: Self-pay | Admitting: *Deleted

## 2014-09-01 ENCOUNTER — Encounter: Payer: Self-pay | Admitting: Obstetrics & Gynecology

## 2014-09-01 ENCOUNTER — Ambulatory Visit (INDEPENDENT_AMBULATORY_CARE_PROVIDER_SITE_OTHER): Payer: BC Managed Care – PPO | Admitting: Obstetrics & Gynecology

## 2014-09-01 VITALS — BP 108/72 | HR 89

## 2014-09-01 DIAGNOSIS — O34219 Maternal care for unspecified type scar from previous cesarean delivery: Secondary | ICD-10-CM

## 2014-09-01 DIAGNOSIS — O3421 Maternal care for scar from previous cesarean delivery: Secondary | ICD-10-CM

## 2014-09-01 DIAGNOSIS — Z3483 Encounter for supervision of other normal pregnancy, third trimester: Secondary | ICD-10-CM

## 2014-09-01 NOTE — Progress Notes (Signed)
Pelvic cultures next visit. Fundal height always less than dates, ultrasounds normal. Continue to monitor.

## 2014-09-01 NOTE — Patient Instructions (Signed)
Return to clinic for any obstetric concerns or go to [facility] for evaluation  

## 2014-09-09 ENCOUNTER — Encounter: Payer: Self-pay | Admitting: Family Medicine

## 2014-09-09 ENCOUNTER — Ambulatory Visit (INDEPENDENT_AMBULATORY_CARE_PROVIDER_SITE_OTHER): Payer: BC Managed Care – PPO | Admitting: Family Medicine

## 2014-09-09 VITALS — BP 112/75 | HR 64 | Wt 166.4 lb

## 2014-09-09 DIAGNOSIS — Z36 Encounter for antenatal screening of mother: Secondary | ICD-10-CM | POA: Diagnosis not present

## 2014-09-09 DIAGNOSIS — Z3483 Encounter for supervision of other normal pregnancy, third trimester: Secondary | ICD-10-CM

## 2014-09-09 DIAGNOSIS — O3421 Maternal care for scar from previous cesarean delivery: Secondary | ICD-10-CM

## 2014-09-09 DIAGNOSIS — O34219 Maternal care for unspecified type scar from previous cesarean delivery: Secondary | ICD-10-CM

## 2014-09-09 LAB — OB RESULTS CONSOLE GC/CHLAMYDIA
Chlamydia: NEGATIVE
GC PROBE AMP, GENITAL: NEGATIVE

## 2014-09-09 LAB — OB RESULTS CONSOLE GBS: STREP GROUP B AG: NEGATIVE

## 2014-09-09 NOTE — Progress Notes (Signed)
Cultures today S< D but ok growth < 4 wks ago Will watch

## 2014-09-09 NOTE — Patient Instructions (Signed)
Postpartum Depression and Baby Blues The postpartum period begins right after the birth of a baby. During this time, there is often a great amount of joy and excitement. It is also a time of many changes in the life of the parents. Regardless of how many times a mother gives birth, each child brings new challenges and dynamics to the family. It is not unusual to have feelings of excitement along with confusing shifts in moods, emotions, and thoughts. All mothers are at risk of developing postpartum depression or the "baby blues." These mood changes can occur right after giving birth, or they may occur many months after giving birth. The baby blues or postpartum depression can be mild or severe. Additionally, postpartum depression can go away rather quickly, or it can be a long-term condition.  CAUSES Raised hormone levels and the rapid drop in those levels are thought to be a main cause of postpartum depression and the baby blues. A number of hormones change during and after pregnancy. Estrogen and progesterone usually decrease right after the delivery of your baby. The levels of thyroid hormone and various cortisol steroids also rapidly drop. Other factors that play a role in these mood changes include major life events and genetics.  RISK FACTORS If you have any of the following risks for the baby blues or postpartum depression, know what symptoms to watch out for during the postpartum period. Risk factors that may increase the likelihood of getting the baby blues or postpartum depression include:  Having a personal or family history of depression.   Having depression while being pregnant.   Having premenstrual mood issues or mood issues related to oral contraceptives.  Having a lot of life stress.   Having marital conflict.   Lacking a social support network.   Having a baby with special needs.   Having health problems, such as diabetes.  SIGNS AND SYMPTOMS Symptoms of baby blues  include:  Brief changes in mood, such as going from extreme happiness to sadness.  Decreased concentration.   Difficulty sleeping.   Crying spells, tearfulness.   Irritability.   Anxiety.  Symptoms of postpartum depression typically begin within the first month after giving birth. These symptoms include:  Difficulty sleeping or excessive sleepiness.   Marked weight loss.   Agitation.   Feelings of worthlessness.   Lack of interest in activity or food.  Postpartum psychosis is a very serious condition and can be dangerous. Fortunately, it is rare. Displaying any of the following symptoms is cause for immediate medical attention. Symptoms of postpartum psychosis include:   Hallucinations and delusions.   Bizarre or disorganized behavior.   Confusion or disorientation.  DIAGNOSIS  A diagnosis is made by an evaluation of your symptoms. There are no medical or lab tests that lead to a diagnosis, but there are various questionnaires that a health care provider may use to identify those with the baby blues, postpartum depression, or psychosis. Often, a screening tool called the [place] Postnatal Depression Scale is used to diagnose depression in the postpartum period.  TREATMENT The baby blues usually goes away on its own in 1-2 weeks. Social support is often all that is needed. You will be encouraged to get adequate sleep and rest. Occasionally, you may be given medicines to help you sleep.  Postpartum depression requires treatment because it can last several months or longer if it is not treated. Treatment may include individual or group therapy, medicine, or both to address any social, physiological, and psychological  factors that may play a role in the depression. Regular exercise, a healthy diet, rest, and social support may also be strongly recommended.  Postpartum psychosis is more serious and needs treatment right away. Hospitalization is often needed. HOME CARE  INSTRUCTIONS  Get as much rest as you can. Nap when the baby sleeps.   Exercise regularly. Some women find yoga and walking to be beneficial.   Eat a balanced and nourishing diet.   Do little things that you enjoy. Have a cup of tea, take a bubble bath, read your favorite magazine, or listen to your favorite music.  Avoid alcohol.   Ask for help with household chores, cooking, grocery shopping, or running errands as needed. Do not try to do everything.   Talk to people close to you about how you are feeling. Get support from your partner, family members, friends, or other new moms.  Try to stay positive in how you think. Think about the things you are grateful for.   Do not spend a lot of time alone.   Only take over-the-counter or prescription medicine as directed by your health care provider.  Keep all your postpartum appointments.   Let your health care provider know if you have any concerns.  SEEK MEDICAL CARE IF: You are having a reaction to or problems with your medicine. SEEK IMMEDIATE MEDICAL CARE IF:  You have suicidal feelings.   You think you may harm the baby or someone else. MAKE SURE YOU:  Understand these instructions.  Will watch your condition.  Will get help right away if you are not doing well or get worse. Document Released: 02/11/2004 Document Revised: 05/14/2013 Document Reviewed: 02/18/2013 [facility] Patient Information 2015 [place], [place]. This information is not intended to replace advice given to you by your health care provider. Make sure you discuss any questions you have with your health care provider.  Breastfeeding Deciding to breastfeed is one of the best choices you can make for you and your baby. A change in hormones during pregnancy causes your breast tissue to grow and increases the number and size of your milk ducts. These hormones also allow proteins, sugars, and fats from your blood supply to make breast milk in your  milk-producing glands. Hormones prevent breast milk from being released before your baby is born as well as prompt milk flow after birth. Once breastfeeding has begun, thoughts of your baby, as well as his or her sucking or crying, can stimulate the release of milk from your milk-producing glands.  BENEFITS OF BREASTFEEDING For Your Baby  Your first milk (colostrum) helps your baby's digestive system function better.   There are antibodies in your milk that help your baby fight off infections.   Your baby has a lower incidence of asthma, allergies, and sudden infant death syndrome.   The nutrients in breast milk are better for your baby than infant formulas and are designed uniquely for your baby's needs.   Breast milk improves your baby's brain development.   Your baby is less likely to develop other conditions, such as childhood obesity, asthma, or type 2 diabetes mellitus.  For You   Breastfeeding helps to create a very special bond between you and your baby.   Breastfeeding is convenient. Breast milk is always available at the correct temperature and costs nothing.   Breastfeeding helps to burn calories and helps you lose the weight gained during pregnancy.   Breastfeeding makes your uterus contract to its prepregnancy size faster and slows  bleeding (lochia) after you give birth.   Breastfeeding helps to lower your risk of developing type 2 diabetes mellitus, osteoporosis, and breast or ovarian cancer later in life. SIGNS THAT YOUR BABY IS HUNGRY Early Signs of Hunger  Increased alertness or activity.  Stretching.  Movement of the head from side to side.  Movement of the head and opening of the mouth when the corner of the mouth or cheek is stroked (rooting).  Increased sucking sounds, smacking lips, cooing, sighing, or squeaking.  Hand-to-mouth movements.  Increased sucking of fingers or hands. Late Signs of Hunger  Fussing.  Intermittent crying. Extreme  Signs of Hunger Signs of extreme hunger will require calming and consoling before your baby will be able to breastfeed successfully. Do not wait for the following signs of extreme hunger to occur before you initiate breastfeeding:   Restlessness.  A loud, strong cry.   Screaming. BREASTFEEDING BASICS Breastfeeding Initiation  Find a comfortable place to sit or lie down, with your neck and back well supported.  Place a pillow or rolled up blanket under your baby to bring him or her to the level of your breast (if you are seated). Nursing pillows are specially designed to help support your arms and your baby while you breastfeed.  Make sure that your baby's abdomen is facing your abdomen.   Gently massage your breast. With your fingertips, massage from your chest wall toward your nipple in a circular motion. This encourages milk flow. You may need to continue this action during the feeding if your milk flows slowly.  Support your breast with 4 fingers underneath and your thumb above your nipple. Make sure your fingers are well away from your nipple and your baby's mouth.   Stroke your baby's lips gently with your finger or nipple.   When your baby's mouth is open wide enough, quickly bring your baby to your breast, placing your entire nipple and as much of the colored area around your nipple (areola) as possible into your baby's mouth.   More areola should be visible above your baby's upper lip than below the lower lip.   Your baby's tongue should be between his or her lower gum and your breast.   Ensure that your baby's mouth is correctly positioned around your nipple (latched). Your baby's lips should create a seal on your breast and be turned out (everted).  It is common for your baby to suck about 2-3 minutes in order to start the flow of breast milk. Latching Teaching your baby how to latch on to your breast properly is very important. An improper latch can cause nipple  pain and decreased milk supply for you and poor weight gain in your baby. Also, if your baby is not latched onto your nipple properly, he or she may swallow some air during feeding. This can make your baby fussy. Burping your baby when you switch breasts during the feeding can help to get rid of the air. However, teaching your baby to latch on properly is still the best way to prevent fussiness from swallowing air while breastfeeding. Signs that your baby has successfully latched on to your nipple:    Silent tugging or silent sucking, without causing you pain.   Swallowing heard between every 3-4 sucks.    Muscle movement above and in front of his or her ears while sucking.  Signs that your baby has not successfully latched on to nipple:   Sucking sounds or smacking sounds from your baby  while breastfeeding.  Nipple pain. If you think your baby has not latched on correctly, slip your finger into the corner of your baby's mouth to break the suction and place it between your baby's gums. Attempt breastfeeding initiation again. Signs of Successful Breastfeeding Signs from your baby:   A gradual decrease in the number of sucks or complete cessation of sucking.   Falling asleep.   Relaxation of his or her body.   Retention of a small amount of milk in his or her mouth.   Letting go of your breast by himself or herself. Signs from you:  Breasts that have increased in firmness, weight, and size 1-3 hours after feeding.   Breasts that are softer immediately after breastfeeding.  Increased milk volume, as well as a change in milk consistency and color by the fifth day of breastfeeding.   Nipples that are not sore, cracked, or bleeding. Signs That Your [employer] is Getting Enough Milk  Wetting at least 3 diapers in a 24-hour period. The urine should be clear and pale yellow by age 26 days.  At least 3 stools in a 24-hour period by age 26 days. The stool should be soft and  yellow.  At least 3 stools in a 24-hour period by age 54 days. The stool should be seedy and yellow.  No loss of weight greater than 10% of birth weight during the first 48 days of age.  Average weight gain of 4-7 ounces (113-198 g) per week after age 44 days.  Consistent daily weight gain by age 9 days, without weight loss after the age of 2 weeks. After a feeding, your baby may spit up a small amount. This is common. BREASTFEEDING FREQUENCY AND DURATION Frequent feeding will help you make more milk and can prevent sore nipples and breast engorgement. Breastfeed when you feel the need to reduce the fullness of your breasts or when your baby shows signs of hunger. This is called "breastfeeding on demand." Avoid introducing a pacifier to your baby while you are working to establish breastfeeding (the first 4-6 weeks after your baby is born). After this time you may choose to use a pacifier. Research has shown that pacifier use during the first year of a baby's life decreases the risk of sudden infant death syndrome (SIDS). Allow your baby to feed on each breast as long as he or she wants. Breastfeed until your baby is finished feeding. When your baby unlatches or falls asleep while feeding from the first breast, offer the second breast. Because newborns are often sleepy in the first few weeks of life, you may need to awaken your baby to get him or her to feed. Breastfeeding times will vary from baby to baby. However, the following rules can serve as a guide to help you ensure that your baby is properly fed:  Newborns (babies 32 weeks of age or younger) may breastfeed every 1-3 hours.  Newborns should not go longer than 3 hours during the day or 5 hours during the night without breastfeeding.  You should breastfeed your baby a minimum of 8 times in a 24-hour period until you begin to introduce solid foods to your baby at around 61 months of age. BREAST MILK PUMPING Pumping and storing breast milk  allows you to ensure that your baby is exclusively fed your breast milk, even at times when you are unable to breastfeed. This is especially important if you are going back to work while you are still breastfeeding or  when you are not able to be present during feedings. Your lactation consultant can give you guidelines on how long it is safe to store breast milk.  A breast pump is a machine that allows you to pump milk from your breast into a sterile bottle. The pumped breast milk can then be stored in a refrigerator or freezer. Some breast pumps are operated by hand, while others use electricity. Ask your lactation consultant which type will work best for you. Breast pumps can be purchased, but some hospitals and breastfeeding support groups lease breast pumps on a monthly basis. A lactation consultant can teach you how to hand express breast milk, if you prefer not to use a pump.  CARING FOR YOUR BREASTS WHILE YOU BREASTFEED Nipples can become dry, cracked, and sore while breastfeeding. The following recommendations can help keep your breasts moisturized and healthy:  Avoid using soap on your nipples.   Wear a supportive bra. Although not required, special nursing bras and tank tops are designed to allow access to your breasts for breastfeeding without taking off your entire bra or top. Avoid wearing underwire-style bras or extremely tight bras.  Air dry your nipples for 3-54minutes after each feeding.   Use only cotton bra pads to absorb leaked breast milk. Leaking of breast milk between feedings is normal.   Use lanolin on your nipples after breastfeeding. Lanolin helps to maintain your skin's normal moisture barrier. If you use pure lanolin, you do not need to wash it off before feeding your baby again. Pure lanolin is not toxic to your baby. You may also hand express a few drops of breast milk and gently massage that milk into your nipples and allow the milk to air dry. In the first few weeks  after giving birth, some women experience extremely full breasts (engorgement). Engorgement can make your breasts feel heavy, warm, and tender to the touch. Engorgement peaks within 3-5 days after you give birth. The following recommendations can help ease engorgement:  Completely empty your breasts while breastfeeding or pumping. You may want to start by applying warm, moist heat (in the shower or with warm water-soaked hand towels) just before feeding or pumping. This increases circulation and helps the milk flow. If your baby does not completely empty your breasts while breastfeeding, pump any extra milk after he or she is finished.  Wear a snug bra (nursing or regular) or tank top for 1-2 days to signal your body to slightly decrease milk production.  Apply ice packs to your breasts, unless this is too uncomfortable for you.  Make sure that your baby is latched on and positioned properly while breastfeeding. If engorgement persists after 48 hours of following these recommendations, contact your health care provider or a Science writer. OVERALL HEALTH CARE RECOMMENDATIONS WHILE BREASTFEEDING  Eat healthy foods. Alternate between meals and snacks, eating 3 of each per day. Because what you eat affects your breast milk, some of the foods may make your baby more irritable than usual. Avoid eating these foods if you are sure that they are negatively affecting your baby.  Drink milk, fruit juice, and water to satisfy your thirst (about 10 glasses a day).   Rest often, relax, and continue to take your prenatal vitamins to prevent fatigue, stress, and anemia.  Continue breast self-awareness checks.  Avoid chewing and smoking tobacco.  Avoid alcohol and drug use. Some medicines that may be harmful to your baby can pass through breast milk. It is important to ask  your health care provider before taking any medicine, including all over-the-counter and prescription medicine as well as vitamin  and herbal supplements. It is possible to become pregnant while breastfeeding. If birth control is desired, ask your health care provider about options that will be safe for your baby. SEEK MEDICAL CARE IF:   You feel like you want to stop breastfeeding or have become frustrated with breastfeeding.  You have painful breasts or nipples.  Your nipples are cracked or bleeding.  Your breasts are red, tender, or warm.  You have a swollen area on either breast.  You have a fever or chills.  You have nausea or vomiting.  You have drainage other than breast milk from your nipples.  Your breasts do not become full before feedings by the fifth day after you give birth.  You feel sad and depressed.  Your baby is too sleepy to eat well.  Your baby is having trouble sleeping.   Your baby is wetting less than 3 diapers in a 24-hour period.  Your baby has less than 3 stools in a 24-hour period.  Your baby's skin or the white part of his or her eyes becomes yellow.   Your baby is not gaining weight by 8 days of age. SEEK IMMEDIATE MEDICAL CARE IF:   Your baby is overly tired (lethargic) and does not want to wake up and feed.  Your baby develops an unexplained fever. Document Released: 05/09/2005 Document Revised: 05/14/2013 Document Reviewed: 10/31/2012 [facility] Patient Information 2015 [place], [place]. This information is not intended to replace advice given to you by your health care provider. Make sure you discuss any questions you have with your health care provider.

## 2014-09-10 LAB — GC/CHLAMYDIA PROBE AMP
CT PROBE, AMP APTIMA: NEGATIVE
GC Probe RNA: NEGATIVE

## 2014-09-11 LAB — CULTURE, BETA STREP (GROUP B ONLY)

## 2014-09-16 ENCOUNTER — Ambulatory Visit (INDEPENDENT_AMBULATORY_CARE_PROVIDER_SITE_OTHER): Payer: BC Managed Care – PPO | Admitting: Obstetrics & Gynecology

## 2014-09-16 VITALS — BP 119/79 | HR 76 | Wt 166.0 lb

## 2014-09-16 DIAGNOSIS — O34219 Maternal care for unspecified type scar from previous cesarean delivery: Secondary | ICD-10-CM

## 2014-09-16 DIAGNOSIS — Z3493 Encounter for supervision of normal pregnancy, unspecified, third trimester: Secondary | ICD-10-CM

## 2014-09-16 DIAGNOSIS — O3421 Maternal care for scar from previous cesarean delivery: Secondary | ICD-10-CM

## 2014-09-16 NOTE — Progress Notes (Signed)
Routine visit. Good FM. No problems. Labor precautions reviewed.

## 2014-09-22 ENCOUNTER — Ambulatory Visit (INDEPENDENT_AMBULATORY_CARE_PROVIDER_SITE_OTHER): Payer: BC Managed Care – PPO | Admitting: Family Medicine

## 2014-09-22 VITALS — BP 119/76 | HR 76 | Wt 168.0 lb

## 2014-09-22 DIAGNOSIS — O3421 Maternal care for scar from previous cesarean delivery: Secondary | ICD-10-CM

## 2014-09-22 DIAGNOSIS — O34219 Maternal care for unspecified type scar from previous cesarean delivery: Secondary | ICD-10-CM

## 2014-09-22 NOTE — Patient Instructions (Signed)
Third Trimester of Pregnancy The third trimester is from week 29 through week 42, months 7 through 9. The third trimester is a time when the fetus is growing rapidly. At the end of the ninth month, the fetus is about 20 inches in length and weighs 6-10 pounds.  BODY CHANGES Your body goes through many changes during pregnancy. The changes vary from woman to woman.   Your weight will continue to increase. You can expect to gain 25-35 pounds (11-16 kg) by the end of the pregnancy.  You may begin to get stretch marks on your hips, abdomen, and breasts.  You may urinate more often because the fetus is moving lower into your pelvis and pressing on your bladder.  You may develop or continue to have heartburn as a result of your pregnancy.  You may develop constipation because certain hormones are causing the muscles that push waste through your intestines to slow down.  You may develop hemorrhoids or swollen, bulging veins (varicose veins).  You may have pelvic pain because of the weight gain and pregnancy hormones relaxing your joints between the bones in your pelvis. Backaches may result from overexertion of the muscles supporting your posture.  You may have changes in your hair. These can include thickening of your hair, rapid growth, and changes in texture. Some women also have hair loss during or after pregnancy, or hair that feels dry or thin. Your hair will most likely return to normal after your baby is born.  Your breasts will continue to grow and be tender. A yellow discharge may leak from your breasts called colostrum.  Your belly button may stick out.  You may feel short of breath because of your expanding uterus.  You may notice the fetus "dropping," or moving lower in your abdomen.  You may have a bloody mucus discharge. This usually occurs a few days to a week before labor begins.  Your cervix becomes thin and soft (effaced) near your due date. WHAT TO EXPECT AT YOUR  PRENATAL EXAMS  You will have prenatal exams every 2 weeks until week 36. Then, you will have weekly prenatal exams. During a routine prenatal visit:  You will be weighed to make sure you and the fetus are growing normally.  Your blood pressure is taken.  Your abdomen will be measured to track your baby's growth.  The fetal heartbeat will be listened to.  Any test results from the previous visit will be discussed.  You may have a cervical check near your due date to see if you have effaced. At around 36 weeks, your caregiver will check your cervix. At the same time, your caregiver will also perform a test on the secretions of the vaginal tissue. This test is to determine if a type of bacteria, Group B streptococcus, is present. Your caregiver will explain this further. Your caregiver may ask you:  What your birth plan is.  How you are feeling.  If you are feeling the baby move.  If you have had any abnormal symptoms, such as leaking fluid, bleeding, severe headaches, or abdominal cramping.  If you have any questions. Other tests or screenings that may be performed during your third trimester include:  Blood tests that check for low iron levels (anemia).  Fetal testing to check the health, activity level, and growth of the fetus. Testing is done if you have certain medical conditions or if there are problems during the pregnancy. FALSE LABOR You may feel small, irregular contractions that   eventually go away. These are called Braxton Hicks contractions, or false labor. Contractions may last for hours, days, or even weeks before true labor sets in. If contractions come at regular intervals, intensify, or become painful, it is best to be seen by your caregiver.  SIGNS OF LABOR   Menstrual-like cramps.  Contractions that are 5 minutes apart or less.  Contractions that start on the top of the uterus and spread down to the lower abdomen and back.  A sense of increased pelvic  pressure or back pain.  A watery or bloody mucus discharge that comes from the vagina. If you have any of these signs before the 37th week of pregnancy, call your caregiver right away. You need to go to the hospital to get checked immediately. HOME CARE INSTRUCTIONS   Avoid all smoking, herbs, alcohol, and unprescribed drugs. These chemicals affect the formation and growth of the baby.  Follow your caregiver's instructions regarding medicine use. There are medicines that are either safe or unsafe to take during pregnancy.  Exercise only as directed by your caregiver. Experiencing uterine cramps is a good sign to stop exercising.  Continue to eat regular, healthy meals.  Wear a good support bra for breast tenderness.  Do not use hot tubs, steam rooms, or saunas.  Wear your seat belt at all times when driving.  Avoid raw meat, uncooked cheese, cat litter boxes, and soil used by cats. These carry germs that can cause birth defects in the baby.  Take your prenatal vitamins.  Try taking a stool softener (if your caregiver approves) if you develop constipation. Eat more high-fiber foods, such as fresh vegetables or fruit and whole grains. Drink plenty of fluids to keep your urine clear or pale yellow.  Take warm sitz baths to soothe any pain or discomfort caused by hemorrhoids. Use hemorrhoid cream if your caregiver approves.  If you develop varicose veins, wear support hose. Elevate your feet for 15 minutes, 3-4 times a day. Limit salt in your diet.  Avoid heavy lifting, wear low heal shoes, and practice good posture.  Rest a lot with your legs elevated if you have leg cramps or low back pain.  Visit your dentist if you have not gone during your pregnancy. Use a soft toothbrush to brush your teeth and be gentle when you floss.  A sexual relationship may be continued unless your caregiver directs you otherwise.  Do not travel far distances unless it is absolutely necessary and only  with the approval of your caregiver.  Take prenatal classes to understand, practice, and ask questions about the labor and delivery.  Make a trial run to the hospital.  Pack your hospital bag.  Prepare the baby's nursery.  Continue to go to all your prenatal visits as directed by your caregiver. SEEK MEDICAL CARE IF:  You are unsure if you are in labor or if your water has broken.  You have dizziness.  You have mild pelvic cramps, pelvic pressure, or nagging pain in your abdominal area.  You have persistent nausea, vomiting, or diarrhea.  You have a bad smelling vaginal discharge.  You have pain with urination. SEEK IMMEDIATE MEDICAL CARE IF:   You have a fever.  You are leaking fluid from your vagina.  You have spotting or bleeding from your vagina.  You have severe abdominal cramping or pain.  You have rapid weight loss or gain.  You have shortness of breath with chest pain.  You notice sudden or extreme swelling   of your face, hands, ankles, feet, or legs.  You have not felt your baby move in over an hour.  You have severe headaches that do not go away with medicine.  You have vision changes. Document Released: 05/03/2001 Document Revised: 05/14/2013 Document Reviewed: 07/10/2012 [employer] Patient Information 2015 [employer]. This information is not intended to replace advice given to you by your health care provider. Make sure you discuss any questions you have with your health care provider.  Breastfeeding Deciding to breastfeed is one of the best choices you can make for you and your baby. A change in hormones during pregnancy causes your breast tissue to grow and increases the number and size of your milk ducts. These hormones also allow proteins, sugars, and fats from your blood supply to make breast milk in your milk-producing glands. Hormones prevent breast milk from being released before your baby is born as well as prompt milk flow after birth. Once  breastfeeding has begun, thoughts of your baby, as well as his or her sucking or crying, can stimulate the release of milk from your milk-producing glands.  BENEFITS OF BREASTFEEDING For Your Baby  Your first milk (colostrum) helps your baby's digestive system function better.   There are antibodies in your milk that help your baby fight off infections.   Your baby has a lower incidence of asthma, allergies, and sudden infant death syndrome.   The nutrients in breast milk are better for your baby than infant formulas and are designed uniquely for your baby's needs.   Breast milk improves your baby's brain development.   Your baby is less likely to develop other conditions, such as childhood obesity, asthma, or type 2 diabetes mellitus.  For You   Breastfeeding helps to create a very special bond between you and your baby.   Breastfeeding is convenient. Breast milk is always available at the correct temperature and costs nothing.   Breastfeeding helps to burn calories and helps you lose the weight gained during pregnancy.   Breastfeeding makes your uterus contract to its prepregnancy size faster and slows bleeding (lochia) after you give birth.   Breastfeeding helps to lower your risk of developing type 2 diabetes mellitus, osteoporosis, and breast or ovarian cancer later in life. SIGNS THAT YOUR BABY IS HUNGRY Early Signs of Hunger  Increased alertness or activity.  Stretching.  Movement of the head from side to side.  Movement of the head and opening of the mouth when the corner of the mouth or cheek is stroked (rooting).  Increased sucking sounds, smacking lips, cooing, sighing, or squeaking.  Hand-to-mouth movements.  Increased sucking of fingers or hands. Late Signs of Hunger  Fussing.  Intermittent crying. Extreme Signs of Hunger Signs of extreme hunger will require calming and consoling before your baby will be able to breastfeed successfully. Do not  wait for the following signs of extreme hunger to occur before you initiate breastfeeding:   Restlessness.  A loud, strong cry.   Screaming. BREASTFEEDING BASICS Breastfeeding Initiation  Find a comfortable place to sit or lie down, with your neck and back well supported.  Place a pillow or rolled up blanket under your baby to bring him or her to the level of your breast (if you are seated). Nursing pillows are specially designed to help support your arms and your baby while you breastfeed.  Make sure that your baby's abdomen is facing your abdomen.   Gently massage your breast. With your fingertips, massage from your chest   wall toward your nipple in a circular motion. This encourages milk flow. You may need to continue this action during the feeding if your milk flows slowly.  Support your breast with 4 fingers underneath and your thumb above your nipple. Make sure your fingers are well away from your nipple and your baby's mouth.   Stroke your baby's lips gently with your finger or nipple.   When your baby's mouth is open wide enough, quickly bring your baby to your breast, placing your entire nipple and as much of the colored area around your nipple (areola) as possible into your baby's mouth.   More areola should be visible above your baby's upper lip than below the lower lip.   Your baby's tongue should be between his or her lower gum and your breast.   Ensure that your baby's mouth is correctly positioned around your nipple (latched). Your baby's lips should create a seal on your breast and be turned out (everted).  It is common for your baby to suck about 2-3 minutes in order to start the flow of breast milk. Latching Teaching your baby how to latch on to your breast properly is very important. An improper latch can cause nipple pain and decreased milk supply for you and poor weight gain in your baby. Also, if your baby is not latched onto your nipple properly, he or she  may swallow some air during feeding. This can make your baby fussy. Burping your baby when you switch breasts during the feeding can help to get rid of the air. However, teaching your baby to latch on properly is still the best way to prevent fussiness from swallowing air while breastfeeding. Signs that your baby has successfully latched on to your nipple:    Silent tugging or silent sucking, without causing you pain.   Swallowing heard between every 3-4 sucks.    Muscle movement above and in front of his or her ears while sucking.  Signs that your baby has not successfully latched on to nipple:   Sucking sounds or smacking sounds from your baby while breastfeeding.  Nipple pain. If you think your baby has not latched on correctly, slip your finger into the corner of your baby's mouth to break the suction and place it between your baby's gums. Attempt breastfeeding initiation again. Signs of Successful Breastfeeding Signs from your baby:   A gradual decrease in the number of sucks or complete cessation of sucking.   Falling asleep.   Relaxation of his or her body.   Retention of a small amount of milk in his or her mouth.   Letting go of your breast by himself or herself. Signs from you:  Breasts that have increased in firmness, weight, and size 1-3 hours after feeding.   Breasts that are softer immediately after breastfeeding.  Increased milk volume, as well as a change in milk consistency and color by the fifth day of breastfeeding.   Nipples that are not sore, cracked, or bleeding. Signs That Your Baby is Getting Enough Milk  Wetting at least 3 diapers in a 24-hour period. The urine should be clear and pale yellow by age 5 days.  At least 3 stools in a 24-hour period by age 5 days. The stool should be soft and yellow.  At least 3 stools in a 24-hour period by age 7 days. The stool should be seedy and yellow.  No loss of weight greater than 10% of birth weight  during the first 3   days of age.  Average weight gain of 4-7 ounces (113-198 g) per week after age 4 days.  Consistent daily weight gain by age 5 days, without weight loss after the age of 2 weeks. After a feeding, your baby may spit up a small amount. This is common. BREASTFEEDING FREQUENCY AND DURATION Frequent feeding will help you make more milk and can prevent sore nipples and breast engorgement. Breastfeed when you feel the need to reduce the fullness of your breasts or when your baby shows signs of hunger. This is called "breastfeeding on demand." Avoid introducing a pacifier to your baby while you are working to establish breastfeeding (the first 4-6 weeks after your baby is born). After this time you may choose to use a pacifier. Research has shown that pacifier use during the first year of a baby's life decreases the risk of sudden infant death syndrome (SIDS). Allow your baby to feed on each breast as long as he or she wants. Breastfeed until your baby is finished feeding. When your baby unlatches or falls asleep while feeding from the first breast, offer the second breast. Because newborns are often sleepy in the first few weeks of life, you may need to awaken your baby to get him or her to feed. Breastfeeding times will vary from baby to baby. However, the following rules can serve as a guide to help you ensure that your baby is properly fed:  Newborns (babies 4 weeks of age or younger) may breastfeed every 1-3 hours.  Newborns should not go longer than 3 hours during the day or 5 hours during the night without breastfeeding.  You should breastfeed your baby a minimum of 8 times in a 24-hour period until you begin to introduce solid foods to your baby at around 6 months of age. BREAST MILK PUMPING Pumping and storing breast milk allows you to ensure that your baby is exclusively fed your breast milk, even at times when you are unable to breastfeed. This is especially important if you are  going back to work while you are still breastfeeding or when you are not able to be present during feedings. Your lactation consultant can give you guidelines on how long it is safe to store breast milk.  A breast pump is a machine that allows you to pump milk from your breast into a sterile bottle. The pumped breast milk can then be stored in a refrigerator or freezer. Some breast pumps are operated by hand, while others use electricity. Ask your lactation consultant which type will work best for you. Breast pumps can be purchased, but some hospitals and breastfeeding support groups lease breast pumps on a monthly basis. A lactation consultant can teach you how to hand express breast milk, if you prefer not to use a pump.  CARING FOR YOUR BREASTS WHILE YOU BREASTFEED Nipples can become dry, cracked, and sore while breastfeeding. The following recommendations can help keep your breasts moisturized and healthy:  Avoid using soap on your nipples.   Wear a supportive bra. Although not required, special nursing bras and tank tops are designed to allow access to your breasts for breastfeeding without taking off your entire bra or top. Avoid wearing underwire-style bras or extremely tight bras.  Air dry your nipples for 3-4minutes after each feeding.   Use only cotton bra pads to absorb leaked breast milk. Leaking of breast milk between feedings is normal.   Use lanolin on your nipples after breastfeeding. Lanolin helps to maintain your skin's   normal moisture barrier. If you use pure lanolin, you do not need to wash it off before feeding your baby again. Pure lanolin is not toxic to your baby. You may also hand express a few drops of breast milk and gently massage that milk into your nipples and allow the milk to air dry. In the first few weeks after giving birth, some women experience extremely full breasts (engorgement). Engorgement can make your breasts feel heavy, warm, and tender to the touch.  Engorgement peaks within 3-5 days after you give birth. The following recommendations can help ease engorgement:  Completely empty your breasts while breastfeeding or pumping. You may want to start by applying warm, moist heat (in the shower or with warm water-soaked hand towels) just before feeding or pumping. This increases circulation and helps the milk flow. If your baby does not completely empty your breasts while breastfeeding, pump any extra milk after he or she is finished.  Wear a snug bra (nursing or regular) or tank top for 1-2 days to signal your body to slightly decrease milk production.  Apply ice packs to your breasts, unless this is too uncomfortable for you.  Make sure that your baby is latched on and positioned properly while breastfeeding. If engorgement persists after 48 hours of following these recommendations, contact your health care provider or a lactation consultant. OVERALL HEALTH CARE RECOMMENDATIONS WHILE BREASTFEEDING  Eat healthy foods. Alternate between meals and snacks, eating 3 of each per day. Because what you eat affects your breast milk, some of the foods may make your baby more irritable than usual. Avoid eating these foods if you are sure that they are negatively affecting your baby.  Drink milk, fruit juice, and water to satisfy your thirst (about 10 glasses a day).   Rest often, relax, and continue to take your prenatal vitamins to prevent fatigue, stress, and anemia.  Continue breast self-awareness checks.  Avoid chewing and smoking tobacco.  Avoid alcohol and drug use. Some medicines that may be harmful to your baby can pass through breast milk. It is important to ask your health care provider before taking any medicine, including all over-the-counter and prescription medicine as well as vitamin and herbal supplements. It is possible to become pregnant while breastfeeding. If birth control is desired, ask your health care provider about options that  will be safe for your baby. SEEK MEDICAL CARE IF:   You feel like you want to stop breastfeeding or have become frustrated with breastfeeding.  You have painful breasts or nipples.  Your nipples are cracked or bleeding.  Your breasts are red, tender, or warm.  You have a swollen area on either breast.  You have a fever or chills.  You have nausea or vomiting.  You have drainage other than breast milk from your nipples.  Your breasts do not become full before feedings by the fifth day after you give birth.  You feel sad and depressed.  Your baby is too sleepy to eat well.  Your baby is having trouble sleeping.   Your baby is wetting less than 3 diapers in a 24-hour period.  Your baby has less than 3 stools in a 24-hour period.  Your baby's skin or the white part of his or her eyes becomes yellow.   Your baby is not gaining weight by 5 days of age. SEEK IMMEDIATE MEDICAL CARE IF:   Your baby is overly tired (lethargic) and does not want to wake up and feed.  Your baby   develops an unexplained fever. Document Released: 05/09/2005 Document Revised: 05/14/2013 Document Reviewed: 10/31/2012 [employer] Patient Information 2015 [employer]. This information is not intended to replace advice given to you by your health care provider. Make sure you discuss any questions you have with your health care provider.  

## 2014-09-22 NOTE — Progress Notes (Signed)
Doing well no complaints. Labor precautions

## 2014-09-29 ENCOUNTER — Ambulatory Visit (INDEPENDENT_AMBULATORY_CARE_PROVIDER_SITE_OTHER): Payer: BC Managed Care – PPO | Admitting: Family Medicine

## 2014-09-29 VITALS — BP 116/79 | HR 71 | Wt 172.0 lb

## 2014-09-29 DIAGNOSIS — Z3483 Encounter for supervision of other normal pregnancy, third trimester: Secondary | ICD-10-CM

## 2014-09-29 DIAGNOSIS — O34219 Maternal care for unspecified type scar from previous cesarean delivery: Secondary | ICD-10-CM

## 2014-09-29 DIAGNOSIS — O3421 Maternal care for scar from previous cesarean delivery: Secondary | ICD-10-CM

## 2014-09-29 NOTE — Progress Notes (Signed)
Doing well- Labor precautions NST with OB visit if still pregnant next week.

## 2014-09-29 NOTE — Patient Instructions (Signed)
Gestational Diabetes Mellitus Gestational diabetes mellitus, often simply referred to as gestational diabetes, is a type of diabetes that some women develop during pregnancy. In gestational diabetes, the pancreas does not make enough insulin (a hormone), the cells are less responsive to the insulin that is made (insulin resistance), or both.Normally, insulin moves sugars from food into the tissue cells. The tissue cells use the sugars for energy. The lack of insulin or the lack of normal response to insulin causes excess sugars to build up in the blood instead of going into the tissue cells. As a result, high blood sugar (hyperglycemia) develops. The effect of high sugar (glucose) levels can cause many problems.  RISK FACTORS You have an increased chance of developing gestational diabetes if you have a family history of diabetes and also have one or more of the following risk factors:  A body mass index over 30 (obesity).  A previous pregnancy with gestational diabetes.  An older age at the time of pregnancy. If blood glucose levels are kept in the normal range during pregnancy, women can have a healthy pregnancy. If your blood glucose levels are not well controlled, there may be risks to you, your unborn baby (fetus), your labor and delivery, or your newborn baby.  SYMPTOMS  If symptoms are experienced, they are much like symptoms you would normally expect during pregnancy. The symptoms of gestational diabetes include:   Increased thirst (polydipsia).  Increased urination (polyuria).  Increased urination during the night (nocturia).  Weight loss. This weight loss may be rapid.  Frequent, recurring infections.  Tiredness (fatigue).  Weakness.  Vision changes, such as blurred vision.  Fruity smell to your breath.  Abdominal pain. DIAGNOSIS Diabetes is diagnosed when blood glucose levels are increased. Your blood glucose level may be checked by one or more of the following blood  tests:  A fasting blood glucose test. You will not be allowed to eat for at least 8 hours before a blood sample is taken.  A random blood glucose test. Your blood glucose is checked at any time of the day regardless of when you ate.  A hemoglobin A1c blood glucose test. A hemoglobin A1c test provides information about blood glucose control over the previous 3 months.  An oral glucose tolerance test (OGTT). Your blood glucose is measured after you have not eaten (fasted) for 1-3 hours and then after you drink a glucose-containing beverage. Since the hormones that cause insulin resistance are highest at about 24-28 weeks of a pregnancy, an OGTT is usually performed during that time. If you have risk factors for gestational diabetes, your health care provider may test you for gestational diabetes earlier than 24 weeks of pregnancy. TREATMENT   You will need to take diabetes medicine or insulin daily to keep blood glucose levels in the desired range.  You will need to match insulin dosing with exercise and healthy food choices. The treatment goal is to maintain the before-meal (preprandial), bedtime, and overnight blood glucose level at 60-99 mg/dL during pregnancy. The treatment goal is to further maintain peak after-meal blood sugar (postprandial glucose) level at 100-140 mg/dL. HOME CARE INSTRUCTIONS   Have your hemoglobin A1c level checked twice a year.  Perform daily blood glucose monitoring as directed by your health care provider. It is common to perform frequent blood glucose monitoring.  Monitor urine ketones when you are ill and as directed by your health care provider.  Take your diabetes medicine and insulin as directed by your health care provider   to maintain your blood glucose level in the desired range.  Never run out of diabetes medicine or insulin. It is needed every day.  Adjust insulin based on your intake of carbohydrates. Carbohydrates can raise blood glucose levels but  need to be included in your diet. Carbohydrates provide vitamins, minerals, and fiber which are an essential part of a healthy diet. Carbohydrates are found in fruits, vegetables, whole grains, dairy products, legumes, and foods containing added sugars.  Eat healthy foods. Alternate 3 meals with 3 snacks.  Maintain a healthy weight gain. The usual total expected weight gain varies according to your prepregnancy body mass index (BMI).  Carry a medical alert card or wear your medical alert jewelry.  Carry a 15-gram carbohydrate snack with you at all times to treat low blood glucose (hypoglycemia). Some examples of 15-gram carbohydrate snacks include:  Glucose tablets, 3 or 4.  Glucose gel, 15-gram tube.  Raisins, 2 tablespoons (24 g).  Jelly beans, 6.  Animal crackers, 8.  Fruit juice, regular soda, or low-fat milk, 4 ounces (120 mL).  Gummy treats, 9.  Recognize hypoglycemia. Hypoglycemia during pregnancy occurs with blood glucose levels of 60 mg/dL and below. The risk for hypoglycemia increases when fasting or skipping meals, during or after intense exercise, and during sleep. Hypoglycemia symptoms can include:  Tremors or shakes.  Decreased ability to concentrate.  Sweating.  Increased heart rate.  Headache.  Dry mouth.  Hunger.  Irritability.  Anxiety.  Restless sleep.  Altered speech or coordination.  Confusion.  Treat hypoglycemia promptly. If you are alert and able to safely swallow, follow the 15:15 rule:  Take 15-20 grams of rapid-acting glucose or carbohydrate. Rapid-acting options include glucose gel, glucose tablets, or 4 ounces (120 mL) of fruit juice, regular soda, or low-fat milk.  Check your blood glucose level 15 minutes after taking the glucose.  Take 15-20 grams more of glucose if the repeat blood glucose level is still 70 mg/dL or below.  Eat a meal or snack within 1 hour once blood glucose levels return to normal.  Be alert to polyuria  (excess urination) and polydipsia (excess thirst) which are early signs of hyperglycemia. An early awareness of hyperglycemia allows for prompt treatment. Treat hyperglycemia as directed by your health care provider.  Engage in at least 30 minutes of physical activity a day or as directed by your health care provider. Ten minutes of physical activity timed 30 minutes after each meal is encouraged to control postprandial blood glucose levels.  Adjust your insulin dosing and food intake as needed if you start a new exercise or sport.  Follow your sick-day plan at any time you are unable to eat or drink as usual.  Avoid tobacco and alcohol use.  Keep all follow-up visits as directed by your health care provider.  Follow the advice of your health care provider regarding your prenatal and post-delivery (postpartum) appointments, meal planning, exercise, medicines, vitamins, blood tests, other medical tests, and physical activities.  Perform daily skin and foot care. Examine your skin and feet daily for cuts, bruises, redness, nail problems, bleeding, blisters, or sores.  Brush your teeth and gums at least twice a day and floss at least once a day. Follow up with your dentist regularly.  Schedule an eye exam during the first trimester of your pregnancy or as directed by your health care provider.  Share your diabetes management plan with your workplace or school.  Stay up-to-date with immunizations.  Learn to manage stress.    Obtain ongoing diabetes education and support as needed.  Learn about and consider breastfeeding your baby.  You should have your blood sugar level checked 6-12 weeks after delivery. This is done with an oral glucose tolerance test (OGTT). SEEK MEDICAL CARE IF:   You are unable to eat food or drink fluids for more than 6 hours.  You have nausea and vomiting for more than 6 hours.  You have a blood glucose level of 200 mg/dL and you have ketones in your  urine.  There is a change in mental status.  You develop vision problems.  You have a persistent headache.  You have upper abdominal pain or discomfort.  You develop an additional serious illness.  You have diarrhea for more than 6 hours.  You have been sick or have had a fever for a couple of days and are not getting better. SEEK IMMEDIATE MEDICAL CARE IF:   You have difficulty breathing.  You no longer feel the baby moving.  You are bleeding or have discharge from your vagina.  You start having premature contractions or labor. MAKE SURE YOU:  Understand these instructions.  Will watch your condition.  Will get help right away if you are not doing well or get worse. Document Released: 08/15/2000 Document Revised: 09/23/2013 Document Reviewed: 12/06/2011 [employer] Patient Information 2015 [employer]. This information is not intended to replace advice given to you by your health care provider. Make sure you discuss any questions you have with your health care provider.  Breastfeeding Deciding to breastfeed is one of the best choices you can make for you and your baby. A change in hormones during pregnancy causes your breast tissue to grow and increases the number and size of your milk ducts. These hormones also allow proteins, sugars, and fats from your blood supply to make breast milk in your milk-producing glands. Hormones prevent breast milk from being released before your baby is born as well as prompt milk flow after birth. Once breastfeeding has begun, thoughts of your baby, as well as his or her sucking or crying, can stimulate the release of milk from your milk-producing glands.  BENEFITS OF BREASTFEEDING For Your Baby  Your first milk (colostrum) helps your baby's digestive system function better.   There are antibodies in your milk that help your baby fight off infections.   Your baby has a lower incidence of asthma, allergies, and sudden infant death  syndrome.   The nutrients in breast milk are better for your baby than infant formulas and are designed uniquely for your baby's needs.   Breast milk improves your baby's brain development.   Your baby is less likely to develop other conditions, such as childhood obesity, asthma, or type 2 diabetes mellitus.  For You   Breastfeeding helps to create a very special bond between you and your baby.   Breastfeeding is convenient. Breast milk is always available at the correct temperature and costs nothing.   Breastfeeding helps to burn calories and helps you lose the weight gained during pregnancy.   Breastfeeding makes your uterus contract to its prepregnancy size faster and slows bleeding (lochia) after you give birth.   Breastfeeding helps to lower your risk of developing type 2 diabetes mellitus, osteoporosis, and breast or ovarian cancer later in life. SIGNS THAT YOUR BABY IS HUNGRY Early Signs of Hunger  Increased alertness or activity.  Stretching.  Movement of the head from side to side.  Movement of the head and opening of the   mouth when the corner of the mouth or cheek is stroked (rooting).  Increased sucking sounds, smacking lips, cooing, sighing, or squeaking.  Hand-to-mouth movements.  Increased sucking of fingers or hands. Late Signs of Hunger  Fussing.  Intermittent crying. Extreme Signs of Hunger Signs of extreme hunger will require calming and consoling before your baby will be able to breastfeed successfully. Do not wait for the following signs of extreme hunger to occur before you initiate breastfeeding:   Restlessness.  A loud, strong cry.   Screaming. BREASTFEEDING BASICS Breastfeeding Initiation  Find a comfortable place to sit or lie down, with your neck and back well supported.  Place a pillow or rolled up blanket under your baby to bring him or her to the level of your breast (if you are seated). Nursing pillows are specially designed  to help support your arms and your baby while you breastfeed.  Make sure that your baby's abdomen is facing your abdomen.   Gently massage your breast. With your fingertips, massage from your chest wall toward your nipple in a circular motion. This encourages milk flow. You may need to continue this action during the feeding if your milk flows slowly.  Support your breast with 4 fingers underneath and your thumb above your nipple. Make sure your fingers are well away from your nipple and your baby's mouth.   Stroke your baby's lips gently with your finger or nipple.   When your baby's mouth is open wide enough, quickly bring your baby to your breast, placing your entire nipple and as much of the colored area around your nipple (areola) as possible into your baby's mouth.   More areola should be visible above your baby's upper lip than below the lower lip.   Your baby's tongue should be between his or her lower gum and your breast.   Ensure that your baby's mouth is correctly positioned around your nipple (latched). Your baby's lips should create a seal on your breast and be turned out (everted).  It is common for your baby to suck about 2-3 minutes in order to start the flow of breast milk. Latching Teaching your baby how to latch on to your breast properly is very important. An improper latch can cause nipple pain and decreased milk supply for you and poor weight gain in your baby. Also, if your baby is not latched onto your nipple properly, he or she may swallow some air during feeding. This can make your baby fussy. Burping your baby when you switch breasts during the feeding can help to get rid of the air. However, teaching your baby to latch on properly is still the best way to prevent fussiness from swallowing air while breastfeeding. Signs that your baby has successfully latched on to your nipple:    Silent tugging or silent sucking, without causing you pain.   Swallowing  heard between every 3-4 sucks.    Muscle movement above and in front of his or her ears while sucking.  Signs that your baby has not successfully latched on to nipple:   Sucking sounds or smacking sounds from your baby while breastfeeding.  Nipple pain. If you think your baby has not latched on correctly, slip your finger into the corner of your baby's mouth to break the suction and place it between your baby's gums. Attempt breastfeeding initiation again. Signs of Successful Breastfeeding Signs from your baby:   A gradual decrease in the number of sucks or complete cessation of sucking.     Falling asleep.   Relaxation of his or her body.   Retention of a small amount of milk in his or her mouth.   Letting go of your breast by himself or herself. Signs from you:  Breasts that have increased in firmness, weight, and size 1-3 hours after feeding.   Breasts that are softer immediately after breastfeeding.  Increased milk volume, as well as a change in milk consistency and color by the fifth day of breastfeeding.   Nipples that are not sore, cracked, or bleeding. Signs That Your Baby is Getting Enough Milk  Wetting at least 3 diapers in a 24-hour period. The urine should be clear and pale yellow by age 5 days.  At least 3 stools in a 24-hour period by age 5 days. The stool should be soft and yellow.  At least 3 stools in a 24-hour period by age 7 days. The stool should be seedy and yellow.  No loss of weight greater than 10% of birth weight during the first 3 days of age.  Average weight gain of 4-7 ounces (113-198 g) per week after age 4 days.  Consistent daily weight gain by age 5 days, without weight loss after the age of 2 weeks. After a feeding, your baby may spit up a small amount. This is common. BREASTFEEDING FREQUENCY AND DURATION Frequent feeding will help you make more milk and can prevent sore nipples and breast engorgement. Breastfeed when you feel the  need to reduce the fullness of your breasts or when your baby shows signs of hunger. This is called "breastfeeding on demand." Avoid introducing a pacifier to your baby while you are working to establish breastfeeding (the first 4-6 weeks after your baby is born). After this time you may choose to use a pacifier. Research has shown that pacifier use during the first year of a baby's life decreases the risk of sudden infant death syndrome (SIDS). Allow your baby to feed on each breast as long as he or she wants. Breastfeed until your baby is finished feeding. When your baby unlatches or falls asleep while feeding from the first breast, offer the second breast. Because newborns are often sleepy in the first few weeks of life, you may need to awaken your baby to get him or her to feed. Breastfeeding times will vary from baby to baby. However, the following rules can serve as a guide to help you ensure that your baby is properly fed:  Newborns (babies 4 weeks of age or younger) may breastfeed every 1-3 hours.  Newborns should not go longer than 3 hours during the day or 5 hours during the night without breastfeeding.  You should breastfeed your baby a minimum of 8 times in a 24-hour period until you begin to introduce solid foods to your baby at around 6 months of age. BREAST MILK PUMPING Pumping and storing breast milk allows you to ensure that your baby is exclusively fed your breast milk, even at times when you are unable to breastfeed. This is especially important if you are going back to work while you are still breastfeeding or when you are not able to be present during feedings. Your lactation consultant can give you guidelines on how long it is safe to store breast milk.  A breast pump is a machine that allows you to pump milk from your breast into a sterile bottle. The pumped breast milk can then be stored in a refrigerator or freezer. Some breast pumps are operated by   hand, while others use  electricity. Ask your lactation consultant which type will work best for you. Breast pumps can be purchased, but some hospitals and breastfeeding support groups lease breast pumps on a monthly basis. A lactation consultant can teach you how to hand express breast milk, if you prefer not to use a pump.  CARING FOR YOUR BREASTS WHILE YOU BREASTFEED Nipples can become dry, cracked, and sore while breastfeeding. The following recommendations can help keep your breasts moisturized and healthy:  Avoid using soap on your nipples.   Wear a supportive bra. Although not required, special nursing bras and tank tops are designed to allow access to your breasts for breastfeeding without taking off your entire bra or top. Avoid wearing underwire-style bras or extremely tight bras.  Air dry your nipples for 3-4minutes after each feeding.   Use only cotton bra pads to absorb leaked breast milk. Leaking of breast milk between feedings is normal.   Use lanolin on your nipples after breastfeeding. Lanolin helps to maintain your skin's normal moisture barrier. If you use pure lanolin, you do not need to wash it off before feeding your baby again. Pure lanolin is not toxic to your baby. You may also hand express a few drops of breast milk and gently massage that milk into your nipples and allow the milk to air dry. In the first few weeks after giving birth, some women experience extremely full breasts (engorgement). Engorgement can make your breasts feel heavy, warm, and tender to the touch. Engorgement peaks within 3-5 days after you give birth. The following recommendations can help ease engorgement:  Completely empty your breasts while breastfeeding or pumping. You may want to start by applying warm, moist heat (in the shower or with warm water-soaked hand towels) just before feeding or pumping. This increases circulation and helps the milk flow. If your baby does not completely empty your breasts while  breastfeeding, pump any extra milk after he or she is finished.  Wear a snug bra (nursing or regular) or tank top for 1-2 days to signal your body to slightly decrease milk production.  Apply ice packs to your breasts, unless this is too uncomfortable for you.  Make sure that your baby is latched on and positioned properly while breastfeeding. If engorgement persists after 48 hours of following these recommendations, contact your health care provider or a lactation consultant. OVERALL HEALTH CARE RECOMMENDATIONS WHILE BREASTFEEDING  Eat healthy foods. Alternate between meals and snacks, eating 3 of each per day. Because what you eat affects your breast milk, some of the foods may make your baby more irritable than usual. Avoid eating these foods if you are sure that they are negatively affecting your baby.  Drink milk, fruit juice, and water to satisfy your thirst (about 10 glasses a day).   Rest often, relax, and continue to take your prenatal vitamins to prevent fatigue, stress, and anemia.  Continue breast self-awareness checks.  Avoid chewing and smoking tobacco.  Avoid alcohol and drug use. Some medicines that may be harmful to your baby can pass through breast milk. It is important to ask your health care provider before taking any medicine, including all over-the-counter and prescription medicine as well as vitamin and herbal supplements. It is possible to become pregnant while breastfeeding. If birth control is desired, ask your health care provider about options that will be safe for your baby. SEEK MEDICAL CARE IF:   You feel like you want to stop breastfeeding or have become   frustrated with breastfeeding.  You have painful breasts or nipples.  Your nipples are cracked or bleeding.  Your breasts are red, tender, or warm.  You have a swollen area on either breast.  You have a fever or chills.  You have nausea or vomiting.  You have drainage other than breast milk from  your nipples.  Your breasts do not become full before feedings by the fifth day after you give birth.  You feel sad and depressed.  Your baby is too sleepy to eat well.  Your baby is having trouble sleeping.   Your baby is wetting less than 3 diapers in a 24-hour period.  Your baby has less than 3 stools in a 24-hour period.  Your baby's skin or the white part of his or her eyes becomes yellow.   Your baby is not gaining weight by 5 days of age. SEEK IMMEDIATE MEDICAL CARE IF:   Your baby is overly tired (lethargic) and does not want to wake up and feed.  Your baby develops an unexplained fever. Document Released: 05/09/2005 Document Revised: 05/14/2013 Document Reviewed: 10/31/2012 [employer] Patient Information 2015 [employer]. This information is not intended to replace advice given to you by your health care provider. Make sure you discuss any questions you have with your health care provider.  

## 2014-10-07 ENCOUNTER — Encounter: Payer: Self-pay | Admitting: Obstetrics & Gynecology

## 2014-10-07 ENCOUNTER — Ambulatory Visit (INDEPENDENT_AMBULATORY_CARE_PROVIDER_SITE_OTHER): Payer: BC Managed Care – PPO | Admitting: Obstetrics & Gynecology

## 2014-10-07 VITALS — BP 116/77 | HR 64 | Wt 172.6 lb

## 2014-10-07 DIAGNOSIS — Z3483 Encounter for supervision of other normal pregnancy, third trimester: Secondary | ICD-10-CM

## 2014-10-07 DIAGNOSIS — O480 Post-term pregnancy: Secondary | ICD-10-CM | POA: Diagnosis not present

## 2014-10-07 DIAGNOSIS — O3421 Maternal care for scar from previous cesarean delivery: Secondary | ICD-10-CM

## 2014-10-07 DIAGNOSIS — O34219 Maternal care for unspecified type scar from previous cesarean delivery: Secondary | ICD-10-CM

## 2014-10-07 NOTE — Patient Instructions (Signed)
Return to clinic for any obstetric concerns or go to [facility] for evaluation  

## 2014-10-07 NOTE — Progress Notes (Signed)
Postdates NST performed today was reviewed and was found to be reactive.   Will have next NST and AFI later in the week; IOL at 41 weeks if no SOL. No other complaints or concerns.  Labor and fetal movement precautions reviewed.

## 2014-10-08 ENCOUNTER — Telehealth (HOSPITAL_COMMUNITY): Payer: Self-pay | Admitting: *Deleted

## 2014-10-08 NOTE — Telephone Encounter (Signed)
Preadmission screen  

## 2014-10-09 ENCOUNTER — Inpatient Hospital Stay (HOSPITAL_COMMUNITY)
Admission: AD | Admit: 2014-10-09 | Discharge: 2014-10-12 | DRG: 774 | Disposition: A | Discharge status: Home / Self Care | Payer: BC Managed Care – PPO | Source: Ambulatory Visit | Attending: Obstetrics & Gynecology | Admitting: Obstetrics & Gynecology

## 2014-10-09 ENCOUNTER — Inpatient Hospital Stay (HOSPITAL_COMMUNITY): Payer: BC Managed Care – PPO | Admitting: Anesthesiology

## 2014-10-09 ENCOUNTER — Encounter (HOSPITAL_COMMUNITY): Payer: Self-pay | Admitting: *Deleted

## 2014-10-09 DIAGNOSIS — N719 Inflammatory disease of uterus, unspecified: Secondary | ICD-10-CM | POA: Diagnosis not present

## 2014-10-09 DIAGNOSIS — N858 Other specified noninflammatory disorders of uterus: Secondary | ICD-10-CM | POA: Diagnosis present

## 2014-10-09 DIAGNOSIS — O09299 Supervision of pregnancy with other poor reproductive or obstetric history, unspecified trimester: Secondary | ICD-10-CM

## 2014-10-09 DIAGNOSIS — O4292 Full-term premature rupture of membranes, unspecified as to length of time between rupture and onset of labor: Secondary | ICD-10-CM | POA: Diagnosis present

## 2014-10-09 DIAGNOSIS — O4593 Premature separation of placenta, unspecified, third trimester: Secondary | ICD-10-CM | POA: Diagnosis not present

## 2014-10-09 DIAGNOSIS — D649 Anemia, unspecified: Secondary | ICD-10-CM | POA: Diagnosis not present

## 2014-10-09 DIAGNOSIS — O480 Post-term pregnancy: Secondary | ICD-10-CM | POA: Diagnosis present

## 2014-10-09 DIAGNOSIS — Z3A40 40 weeks gestation of pregnancy: Secondary | ICD-10-CM | POA: Diagnosis present

## 2014-10-09 DIAGNOSIS — O4590 Premature separation of placenta, unspecified, unspecified trimester: Secondary | ICD-10-CM | POA: Diagnosis not present

## 2014-10-09 DIAGNOSIS — O9081 Anemia of the puerperium: Secondary | ICD-10-CM | POA: Diagnosis not present

## 2014-10-09 DIAGNOSIS — O3421 Maternal care for scar from previous cesarean delivery: Secondary | ICD-10-CM | POA: Diagnosis present

## 2014-10-09 DIAGNOSIS — O8612 Endometritis following delivery: Secondary | ICD-10-CM | POA: Diagnosis not present

## 2014-10-09 DIAGNOSIS — O34219 Maternal care for unspecified type scar from previous cesarean delivery: Secondary | ICD-10-CM | POA: Diagnosis present

## 2014-10-09 LAB — RPR: RPR Ser Ql: NONREACTIVE

## 2014-10-09 LAB — DIC (DISSEMINATED INTRAVASCULAR COAGULATION)PANEL
D-Dimer, Quant: 3.89 ug/mL-FEU — ABNORMAL HIGH (ref 0.00–0.48)
Fibrinogen: 334 mg/dL (ref 204–475)
INR: 1.09 (ref 0.00–1.49)
Prothrombin Time: 14.3 seconds (ref 11.6–15.2)

## 2014-10-09 LAB — CBC
HCT: 37.2 % (ref 36.0–46.0)
HCT: 32.8 % — ABNORMAL LOW (ref 36.0–46.0)
HEMOGLOBIN: 13 g/dL (ref 12.0–15.0)
Hemoglobin: 11.2 g/dL — ABNORMAL LOW (ref 12.0–15.0)
MCH: 32.8 pg (ref 26.0–34.0)
MCH: 32.6 pg (ref 26.0–34.0)
MCHC: 34.9 g/dL (ref 30.0–36.0)
MCHC: 34.1 g/dL (ref 30.0–36.0)
MCV: 93.9 fL (ref 78.0–100.0)
MCV: 95.3 fL (ref 78.0–100.0)
PLATELETS: 198 10*3/uL (ref 150–400)
Platelets: 204 10*3/uL (ref 150–400)
RBC: 3.96 MIL/uL (ref 3.87–5.11)
RBC: 3.44 MIL/uL — AB (ref 3.87–5.11)
RDW: 13.4 % (ref 11.5–15.5)
RDW: 13.5 % (ref 11.5–15.5)
WBC: 13.2 10*3/uL — ABNORMAL HIGH (ref 4.0–10.5)
WBC: 21.9 10*3/uL — AB (ref 4.0–10.5)

## 2014-10-09 LAB — DIC (DISSEMINATED INTRAVASCULAR COAGULATION) PANEL
APTT: 24 s (ref 24–37)
Platelets: 204 10*3/uL (ref 150–400)
SMEAR REVIEW: NONE SEEN

## 2014-10-09 LAB — POSTPARTUM HEMORRHAGE PROTOCOL (BB NOTIFICATION)

## 2014-10-09 MED ORDER — OXYCODONE-ACETAMINOPHEN 5-325 MG PO TABS: 2.0000 | ORAL_TABLET | ORAL | Status: DC | PRN

## 2014-10-09 MED ORDER — TERBUTALINE SULFATE 1 MG/ML IJ SOLN: 0.2500 mg | Freq: Once | INTRAMUSCULAR | Status: AC | PRN

## 2014-10-09 MED ORDER — LIDOCAINE HCL (PF) 1 % IJ SOLN
INTRAMUSCULAR | Status: DC | PRN
  Administered 2014-10-09: 9 mL
  Administered 2014-10-09: 8 mL

## 2014-10-09 MED ORDER — FENTANYL 2.5 MCG/ML BUPIVACAINE 1/10 % EPIDURAL INFUSION (WH - ANES)
14.0000 mL/h | INTRAMUSCULAR | Status: DC | PRN
  Administered 2014-10-09: 14 mL/h via EPIDURAL
  Filled 2014-10-09: qty 125

## 2014-10-09 MED ORDER — EPHEDRINE 5 MG/ML INJ
10.0000 mg | INTRAVENOUS | Status: DC | PRN
  Filled 2014-10-09: qty 2

## 2014-10-09 MED ORDER — MISOPROSTOL 200 MCG PO TABS
ORAL_TABLET | ORAL | Status: AC
  Administered 2014-10-09: 1000 ug via RECTAL
  Filled 2014-10-09: qty 5

## 2014-10-09 MED ORDER — OXYTOCIN BOLUS FROM INFUSION
500.0000 mL | INTRAVENOUS | Status: DC
  Administered 2014-10-09: 500 mL via INTRAVENOUS

## 2014-10-09 MED ORDER — ONDANSETRON HCL 4 MG/2ML IJ SOLN: 4.0000 mg | Freq: Four times a day (QID) | INTRAMUSCULAR | Status: DC | PRN

## 2014-10-09 MED ORDER — ACETAMINOPHEN 325 MG PO TABS: 650.0000 mg | ORAL_TABLET | ORAL | Status: DC | PRN

## 2014-10-09 MED ORDER — FENTANYL CITRATE (PF) 100 MCG/2ML IJ SOLN
100.0000 ug | INTRAMUSCULAR | Status: DC | PRN
  Administered 2014-10-09: 100 ug via INTRAVENOUS
  Filled 2014-10-09: qty 2

## 2014-10-09 MED ORDER — FLEET ENEMA 7-19 GM/118ML RE ENEM: 1.0000 | ENEMA | RECTAL | Status: DC | PRN

## 2014-10-09 MED ORDER — OXYTOCIN 40 UNITS IN LACTATED RINGERS INFUSION - SIMPLE MED
1.0000 m[IU]/min | INTRAVENOUS | Status: DC
  Administered 2014-10-09: 2 m[IU]/min via INTRAVENOUS
  Administered 2014-10-09: 62.5 m[IU]/min via INTRAVENOUS
  Filled 2014-10-09 (×3): qty 1000

## 2014-10-09 MED ORDER — DIPHENHYDRAMINE HCL 50 MG/ML IJ SOLN: 12.5000 mg | INTRAMUSCULAR | Status: DC | PRN

## 2014-10-09 MED ORDER — OXYCODONE-ACETAMINOPHEN 5-325 MG PO TABS
1.0000 | ORAL_TABLET | ORAL | Status: DC | PRN
Start: 2014-10-09 — End: 2014-10-10

## 2014-10-09 MED ORDER — CITRIC ACID-SODIUM CITRATE 334-500 MG/5ML PO SOLN: 30.0000 mL | ORAL | Status: DC | PRN

## 2014-10-09 MED ORDER — MISOPROSTOL 200 MCG PO TABS
1000.0000 ug | ORAL_TABLET | Freq: Once | ORAL | Status: AC
  Administered 2014-10-09: 1000 ug via RECTAL

## 2014-10-09 MED ORDER — PHENYLEPHRINE 40 MCG/ML (10ML) SYRINGE FOR IV PUSH (FOR BLOOD PRESSURE SUPPORT)
80.0000 ug | PREFILLED_SYRINGE | INTRAVENOUS | Status: DC | PRN
  Filled 2014-10-09: qty 20
  Filled 2014-10-09: qty 2

## 2014-10-09 MED ORDER — LACTATED RINGERS IV SOLN
INTRAVENOUS | Status: DC
  Administered 2014-10-09 (×3): via INTRAVENOUS

## 2014-10-09 MED ORDER — OXYTOCIN 40 UNITS IN LACTATED RINGERS INFUSION - SIMPLE MED: 62.5000 mL/h | INTRAVENOUS | Status: DC

## 2014-10-09 MED ORDER — LIDOCAINE HCL (PF) 1 % IJ SOLN
30.0000 mL | INTRAMUSCULAR | Status: DC | PRN
  Filled 2014-10-09: qty 30

## 2014-10-09 MED ORDER — METHYLERGONOVINE MALEATE 0.2 MG/ML IJ SOLN
INTRAMUSCULAR | Status: AC
  Administered 2014-10-09: 0.2 mg via INTRAMUSCULAR
  Filled 2014-10-09: qty 1

## 2014-10-09 MED ORDER — METHYLERGONOVINE MALEATE 0.2 MG/ML IJ SOLN
0.2000 mg | Freq: Once | INTRAMUSCULAR | Status: AC
  Administered 2014-10-09: 0.2 mg via INTRAMUSCULAR

## 2014-10-09 MED ORDER — LACTATED RINGERS IV SOLN
500.0000 mL | INTRAVENOUS | Status: DC | PRN
  Administered 2014-10-09: 300 mL via INTRAVENOUS
  Administered 2014-10-09: 500 mL via INTRAVENOUS

## 2014-10-09 NOTE — Progress Notes (Signed)
Code hemorrage called; Anesthsia, house coverage, RROB, OB charge, MD and midwife at bedside

## 2014-10-09 NOTE — Anesthesia Procedure Notes (Signed)
Epidural Patient location during procedure: OB Start time: 10/09/2014 6:00 PM End time: 10/09/2014 6:06 PM  Staffing Anesthesiologist: Lyn Hollingshead  Preanesthetic Checklist Completed: patient identified, surgical consent, pre-op evaluation, timeout performed, IV checked, risks and benefits discussed and monitors and equipment checked  Epidural Patient position: sitting Prep: site prepped and draped and DuraPrep Patient monitoring: continuous pulse ox and blood pressure Approach: midline Location: L3-L4 Injection technique: LOR air  Needle:  Needle type: Tuohy  Needle gauge: 17 G Needle length: 9 cm and 9 Needle insertion depth: 5 cm cm Catheter type: closed end flexible Catheter size: 19 Gauge Catheter at skin depth: 10 cm Test dose: negative and Other  Assessment Sensory level: T9 Events: blood not aspirated, injection not painful, no injection resistance, negative IV test and no paresthesia  Additional Notes Reason for block:procedure for pain

## 2014-10-09 NOTE — Progress Notes (Signed)
Faculty Practice OB/GYN Attending Note  Subjective:  Called to evaluate patient with ongoing [place] s/p recent VBAC. Pitocin infusion running, Cytotec 1000 mcg PR and Methergine 0.2 mg IM already given. Foley is in place. Patient is responsive, lying in bed.   Code Hemorrhage activated upon my arrival.   Objective:  Blood pressure 125/74, pulse 87, temperature 98.8 F (37.1 C), temperature source Oral, resp. rate 16, height 5\' 6"  (1.676 m), weight 172 lb (78.019 kg), last menstrual period 12/30/2013, SpO2 100 %, unknown if currently breastfeeding. Gen: NAD Abdomen: NT, fundus firm below umbilis Pelvic/Bimanual:  About 300 ml of clots evacuated from atonic lower uterine segment (LUS); LUS atony noted to resolve.   Assessment & Plan:  34 y.o. G2P2 s/p VBAC with PPH secondary to LUS atony now alleviated after clot evacuation. - Code Hemorrhage activated - Second IV access placed by Dr. Royce Macadamia (Anesthesiologist) - CBC and DIC panel ordered; will follow up results and manage accordingly. - Continue foley for now; strict Is and Os - Continue Pitocin infusions; Methergine and Cytotec are already on board - Continue close observation  Of note,  patient's delivery was complicated by placental abruption, infant having Apgars of 0/0/0 and needing intense resuscitation and CPR before heart beat was auscultated after 12 mionutes.  I was in [place] up on the infant and talking to Neonatologist (Dr. Higinio Roger).  I had a detailed conversation with patient and her family about the delivery and shared information communicated to me by Dr. Higinio Roger.  All questions answered.  Appropriate support given to patient and family.   [name], MD, [place] Attending [place], [facility]

## 2014-10-09 NOTE — MAU Note (Signed)
Pt states that she had a bloody discharge at 0300 and is contracting irregularly. Pt states that baby is active and denies LOF

## 2014-10-09 NOTE — Anesthesia Preprocedure Evaluation (Addendum)
Anesthesia Evaluation  Patient identified by MRN, date of birth, ID band Patient awake    Reviewed: Allergy & Precautions, H&P , NPO status , Patient's Chart, lab work & pertinent test results  Airway Mallampati: I  TM Distance: >3 FB Neck ROM: full    Dental no notable dental hx.    Pulmonary neg pulmonary ROS,    Pulmonary exam normal       Cardiovascular negative cardio ROS Normal cardiovascular exam    Neuro/Psych negative neurological ROS  negative psych ROS   GI/Hepatic negative GI ROS, Neg liver ROS,   Endo/Other  negative endocrine ROS  Renal/GU negative Renal ROS     Musculoskeletal   Abdominal Normal abdominal exam  (+)   Peds  Hematology negative hematology ROS (+)   Anesthesia Other Findings   Reproductive/Obstetrics (+) Pregnancy                             Anesthesia Physical Anesthesia Plan  ASA: II  Anesthesia Plan: Epidural   Post-op Pain Management:    Induction:   Airway Management Planned:   Additional Equipment:   Intra-op Plan:   Post-operative Plan:   Informed Consent: I have reviewed the patients History and Physical, chart, labs and discussed the procedure including the risks, benefits and alternatives for the proposed anesthesia with the patient or authorized representative who has indicated his/her understanding and acceptance.     Plan Discussed with:   Anesthesia Plan Comments:         Anesthesia Quick Evaluation

## 2014-10-09 NOTE — Progress Notes (Signed)
Pt delivered at 2022, apgars 0/0/0, resuscitation performed- HR 130 at 12 minutes. Baby sent to NICU. EBL from delivery was 752mL. [place] called at 2206. Second IV initiated, 1000 cytotec PR given, bolus of pitocin given, 0.2mg  methergine given. DIC panels drawn; hgb resulted at 11.3 (13.0 on admission). Total blood loss ~1758mL. VSS 142/58 to 119/48. Two units of PRBCs held, repeat CBC in 4 hours. Visitors at bedside with CNM explaining situation through entire process.  10/09/2014 10:53 PM

## 2014-10-09 NOTE — MAU Note (Signed)
Report given to [facility]; pt will have to stay in MAU until L&D unit is able to admit patient.

## 2014-10-09 NOTE — Progress Notes (Signed)
Called to room for bleeding.  BLadder full, Foley placed and drained 500cc.  Vaginal exam expelled large clots, fundus firm.  PItocin still running from delivery,  Cytotec 1092mcg given PR.  Pt would have intermittent periods of no bleeding, then lower uterine segment would become boggy and bleeding would resume.  0.2mg  methergine given IM.  Measured blood loss at this point was 1380 and Dr. Harolyn Rutherford called.    CRESENZO-DISHMAN,Nuchem Grattan 10:23 PM

## 2014-10-09 NOTE — Progress Notes (Signed)
Labor Progress Note  Danielle Sawyer is a 34 y.o. G2P1001 at [redacted]w[redacted]d admitted for IOL 2/2 ?PROM. H/o of LTCS (indicated for breech presentation). Currently TOLAC.  S: Experiencing no pain other than pressure. Doing well. No concerns currently.  O:  BP 120/65 mmHg  Pulse 58  Temp(Src) 99.1 F (37.3 C) (Axillary)  Resp 16  Ht 5\' 6"  (1.676 m)  Wt 78.019 kg (172 lb)  BMI 27.77 kg/m2  LMP 12/30/2013    FHT:  FHR: 135 bpm, variability: minimal ,  accelerations:  Abscent,  decelerations:  Present 1 variable UC:   regular, every 2 minutes SVE:     Dilation: 6 Effacement (%): 80 Station: -2, -3 Exam by:: L. Mcdaniel RN  Pitocin @ 3 mu/min  Labs: Lab Results  Component Value Date   WBC 13.2* 10/09/2014   HGB 13.0 10/09/2014   HCT 37.2 10/09/2014   MCV 93.9 10/09/2014   PLT 198 10/09/2014   FB came out at 550PM.  Epidural placed at [facility]. ?ROM after cervical exam.  Assessment / Plan: 34 y.o. G2P1001 [redacted]w[redacted]d  in active labor. Induction of labor due to PROM,  progressing well on pitocin s/p FB.  Labor: Progressing normally, continue pitocin Fetal Wellbeing:  Category II due to variable decels and minimal variability. Pain Control:  Epidural Anticipated MOD:  SVD  Expectant management.  Keep at 86mu/min pitocin.  Dorina Hoyer MS3  RESIDENT ADDENDUM I have separately seen and examined the patient. I have discussed the findings and exam with the medical student and agree with the above note. I helped develop the management plan that is described in the student's note, and I agree with the content.  Any of my additions have already been added to above note.  Luiz Blare, DO 10/09/2014, 6:56 PM

## 2014-10-09 NOTE — H&P (Signed)
Faculty Practice L&D H&P  Danielle Sawyer is a 34 y.o. female G2P1001 with IUP at [redacted]w[redacted]d presenting for ROM without report of contractions.  Patient reports that symptoms with pelvic cramping over past few weeks, lasting few minutes may be hours between cramping episodes. Seems to be more frequent overnight. Around 0300 had leakage of fluids with bloody mucus vaginal discharge. - Admits to good fetal movement - Denies any fevers/chills, abdominal pain, swelling, SOB, HA, vision changes  Prenatal History/Complications: [facility] at [facility] 7 wks. TOLAC, history of prior C-section with 1st pregnancy due to breech. - Recently had reactive NST on 5/17 - GBS Negative - Last [place] on 08/19/14 at [redacted]w[redacted]d with EFW 1925g at 40%tile (resolved placenta previa)  Past Medical History: Past Medical History  Diagnosis Date  . Medical history non-contributory     Past Surgical History: Past Surgical History  Procedure Laterality Date  . Wisdom tooth extraction      x3  . Cesarean section N/A 12/19/2012    Procedure: CESAREAN SECTION;  Surgeon: Woodroe Mode, MD;  Location: [place] [facility];  Service: Obstetrics;  Laterality: N/A;  primary    Obstetrical History: OB History    Gravida Para Term Preterm AB TAB SAB Ectopic Multiple Living   2 1 1       1       Gynecological History: OB History    Gravida Para Term Preterm AB TAB SAB Ectopic Multiple Living   2 1 1       1       Social History: History   Social History  . Marital Status: Married    Spouse Name: N/A  . Number of Children: N/A  . Years of Education: N/A   Social History Main Topics  . Smoking status: Never Smoker   . Smokeless tobacco: Never Used  . Alcohol Use: No     Comment: social/not with pregnancy  . Drug Use: No  . Sexual Activity: Yes    Birth Control/ Protection: None   Other Topics Concern  . None   Social History Narrative    Family History: Family History  Problem Relation Age of Onset  . Heart  disease Father     quad bypass surgery  . Cancer Paternal Aunt     pancreatic  . Cancer Maternal Grandmother     breast  . Cancer Maternal Grandfather     lung  . Cancer Paternal Grandmother     breast  . Cancer Paternal Grandfather   . Cancer Paternal Aunt     Allergies: No Known Allergies  Prescriptions prior to admission  Medication Sig Dispense Refill Last Dose  . Prenatal Vit-Fe Fumarate-FA (PRENATAL MULTIVITAMIN) TABS Take 1 tablet by mouth daily at 12 noon.   Taking    Review of Systems - See above HPI   Blood pressure 125/79, pulse 65, temperature 97.9 F (36.6 C), temperature source Oral, resp. rate 18, height 5\' 6"  (1.676 m), last menstrual period 12/30/2013, unknown if currently breastfeeding.  Gen - well-appearing, comfortable and pleasant, NAD HEENT - NCAT, oropharynx clear, MMM Neck - supple, non-tender, no thyromegaly Heart - RRR, no murmurs heard Lungs - CTAB, no wheezing, crackles, or rhonchi. Non-labored. Abd - appropriately gravid for GA, soft, NTND, +active BS Back - Non-tender Ext - non-tender, no edema, peripheral pulses intact +2 b/l Skin - warm, dry, no rashes Neuro - awake, alert, grossly non-focal, +2 DTRs, intact distal sensation to light touch  Baseline: 120-125 bpm, Variability:  Good {> 6 bpm), Accelerations: Reactive and Decelerations: Absent Frequency: Every 8-15 min, uterine irritability  Dilation: 1 Effacement (%): 60 Station: -3 Exam by:: Lovett Calender RN   Prenatal labs: ABO, Rh: A/POS/-- (09/28 1620) Antibody: NEG (09/28 1620) Rubella:  immune RPR: NON REAC (02/24 1633)  HBsAg: NEGATIVE (09/28 1620)  HIV: NONREACTIVE (02/24 1633)  GBS: Negative (04/19 0000)  1 hr Glucola: 100 (3rd trimester) Genetic screening: decline Anatomy US: normal   Assessment: Danielle Sawyer is a 34 y.o. G2P1001 with an IUP at [redacted]w[redacted]d admitted to L&D for TOLAC currently with PROM (no active labor). Pregnancy uncomplicated. GBS  negative.  Plan: 1. Admit to L&D, routine orders, may have regular diet 2. TOLAC - no cytotec. If needed would consider Foley bulb if not progressing 3. Proceed with expectant management 4. Fentanyl IV 160mcg q 1 hr PRN 5. May have epidural 6. Contraception - partner plans for vasectomy 7. Breastfeeding  Nobie Putnam, [place], PGY-2 10/09/2014, 5:01 AM   CNM attestation:  I have seen and examined this patient; I agree with above documentation in the resident's note.   Danielle Sawyer is a 34 y.o. G2P1001 here for PROM; desires TOLAC  PE: BP 121/72 mmHg  Pulse 54  Temp(Src) 98 F (36.7 C) (Oral)  Resp 18  Ht 5\' 6"  (1.676 m)  Wt 78.019 kg (172 lb)  BMI 27.77 kg/m2  LMP 12/30/2013 Gen: calm comfortable, NAD Resp: normal effort, no distress Abd: gravid  ROS, labs, PMH reviewed  Plan: Admit to [facility] Expectant management Plans [place], [place] 10/09/2014, 7:28 AM

## 2014-10-09 NOTE — Progress Notes (Signed)
Labor Progress Note  JAYLIANI WANNER is a 34 y.o. G2P1001 at [redacted]w[redacted]d  admitted for TOLAC (past C/S indicated for breech presentation) 2/2 PROM at 3 this AM.  GBS negative.  S: Foley bulb placed 1140 this AM. Has no epidural yet (planned to have it placed when pain increases in intensity). Pain is tolerated with no active medications. Tylenol ordered PRN. No other complaints.  O:  BP 129/77 mmHg  Pulse 62  Temp(Src) 98.6 F (37 C) (Oral)  Resp 18  Ht 5\' 6"  (1.676 m)  Wt 78.019 kg (172 lb)  BMI 27.77 kg/m2  LMP 12/30/2013     FHT: Cat I FHR: 130 bpm, variability: moderate,  accelerations:  Present,  decelerations:  Present Early UC:   regular, every 3 minutes SVE:   Dilation: 1 Effacement (%): Thick Station: -3 Exam by:: Dr Gerarda Fraction, Dr Mancel Bale SROM/AROM: SROM@3AM  clear/meconium/bloddy fluid  Labs: Lab Results  Component Value Date   WBC 13.2* 10/09/2014   HGB 13.0 10/09/2014   HCT 37.2 10/09/2014   MCV 93.9 10/09/2014   PLT 198 10/09/2014    Assessment / Plan: 34 y.o. G2P1001 [redacted]w[redacted]d  in early labor Induction of labor due to PROM. Foley bulb inserted at 1140 this AM. Will re-examine cervix and start on Pitocin when FB comes out this PM.  Labor: Progressing normally. Will start Pitocin when FB comes out (discussed risks and benefits with Pt and family this AM). Fetal Wellbeing:  Category I Pain Control:  Labor support without medications. Pt desires epidural. Anticipated MOD:  Expectant management. SVD.

## 2014-10-09 NOTE — Progress Notes (Signed)
Lab at beside for STAT blood draws

## 2014-10-10 ENCOUNTER — Encounter (HOSPITAL_COMMUNITY): Payer: Self-pay | Admitting: *Deleted

## 2014-10-10 ENCOUNTER — Other Ambulatory Visit: Payer: BC Managed Care – PPO

## 2014-10-10 LAB — CBC
HEMATOCRIT: 25 % — AB (ref 36.0–46.0)
HEMOGLOBIN: 8.6 g/dL — AB (ref 12.0–15.0)
MCH: 32.8 pg (ref 26.0–34.0)
MCHC: 34.4 g/dL (ref 30.0–36.0)
MCV: 95.4 fL (ref 78.0–100.0)
Platelets: 209 10*3/uL (ref 150–400)
RBC: 2.62 MIL/uL — ABNORMAL LOW (ref 3.87–5.11)
RDW: 13.5 % (ref 11.5–15.5)
WBC: 21.9 10*3/uL — ABNORMAL HIGH (ref 4.0–10.5)

## 2014-10-10 MED ORDER — BENZOCAINE-MENTHOL 20-0.5 % EX AERO
1.0000 "application " | INHALATION_SPRAY | CUTANEOUS | Status: DC | PRN
  Administered 2014-10-10: 1 via TOPICAL
  Filled 2014-10-10: qty 56

## 2014-10-10 MED ORDER — OXYCODONE-ACETAMINOPHEN 5-325 MG PO TABS
2.0000 | ORAL_TABLET | ORAL | Status: DC | PRN
  Administered 2014-10-11: 2 via ORAL
  Filled 2014-10-10 (×2): qty 2

## 2014-10-10 MED ORDER — WITCH HAZEL-GLYCERIN EX PADS: 1.0000 "application " | MEDICATED_PAD | CUTANEOUS | Status: DC | PRN

## 2014-10-10 MED ORDER — IBUPROFEN 600 MG PO TABS
600.0000 mg | ORAL_TABLET | Freq: Four times a day (QID) | ORAL | Status: DC
  Administered 2014-10-10 – 2014-10-12 (×9): 600 mg via ORAL
  Filled 2014-10-10 (×12): qty 1

## 2014-10-10 MED ORDER — ACETAMINOPHEN 325 MG PO TABS: 650.0000 mg | ORAL_TABLET | ORAL | Status: DC | PRN

## 2014-10-10 MED ORDER — LANOLIN HYDROUS EX OINT: TOPICAL_OINTMENT | CUTANEOUS | Status: DC | PRN

## 2014-10-10 MED ORDER — MEASLES, MUMPS & RUBELLA VAC ~~LOC~~ INJ: 0.5000 mL | INJECTION | Freq: Once | SUBCUTANEOUS | Status: DC

## 2014-10-10 MED ORDER — TETANUS-DIPHTH-ACELL PERTUSSIS 5-2.5-18.5 LF-MCG/0.5 IM SUSP: 0.5000 mL | Freq: Once | INTRAMUSCULAR | Status: DC

## 2014-10-10 MED ORDER — LACTATED RINGERS IV SOLN
INTRAVENOUS | Status: DC
  Administered 2014-10-10: 09:00:00 via INTRAVENOUS

## 2014-10-10 MED ORDER — ONDANSETRON HCL 4 MG/2ML IJ SOLN: 4.0000 mg | INTRAMUSCULAR | Status: DC | PRN

## 2014-10-10 MED ORDER — DIPHENHYDRAMINE HCL 25 MG PO CAPS: 25.0000 mg | ORAL_CAPSULE | Freq: Four times a day (QID) | ORAL | Status: DC | PRN

## 2014-10-10 MED ORDER — DIBUCAINE 1 % RE OINT: 1.0000 "application " | TOPICAL_OINTMENT | RECTAL | Status: DC | PRN

## 2014-10-10 MED ORDER — ZOLPIDEM TARTRATE 5 MG PO TABS: 5.0000 mg | ORAL_TABLET | Freq: Every evening | ORAL | Status: DC | PRN

## 2014-10-10 MED ORDER — ONDANSETRON HCL 4 MG PO TABS: 4.0000 mg | ORAL_TABLET | ORAL | Status: DC | PRN

## 2014-10-10 MED ORDER — ACETAMINOPHEN 500 MG PO TABS
1000.0000 mg | ORAL_TABLET | Freq: Four times a day (QID) | ORAL | Status: DC | PRN
  Administered 2014-10-10: 1000 mg via ORAL
  Filled 2014-10-10: qty 2

## 2014-10-10 MED ORDER — SENNOSIDES-DOCUSATE SODIUM 8.6-50 MG PO TABS
2.0000 | ORAL_TABLET | ORAL | Status: DC
  Administered 2014-10-10: 2 via ORAL
  Filled 2014-10-10 (×3): qty 2

## 2014-10-10 MED ORDER — OXYTOCIN 40 UNITS IN LACTATED RINGERS INFUSION - SIMPLE MED: 62.5000 mL/h | INTRAVENOUS | Status: DC | PRN

## 2014-10-10 MED ORDER — PIPERACILLIN-TAZOBACTAM 3.375 G IVPB 30 MIN
3.3750 g | Freq: Three times a day (TID) | INTRAVENOUS | Status: AC
  Administered 2014-10-10 (×3): 3.375 g via INTRAVENOUS
  Filled 2014-10-10 (×3): qty 50

## 2014-10-10 MED ORDER — DOCUSATE SODIUM 100 MG PO CAPS
100.0000 mg | ORAL_CAPSULE | Freq: Two times a day (BID) | ORAL | Status: DC | PRN
  Administered 2014-10-11: 100 mg via ORAL
  Filled 2014-10-10: qty 1

## 2014-10-10 MED ORDER — SIMETHICONE 80 MG PO CHEW: 80.0000 mg | CHEWABLE_TABLET | ORAL | Status: DC | PRN

## 2014-10-10 MED ORDER — FERROUS SULFATE 325 (65 FE) MG PO TABS
325.0000 mg | ORAL_TABLET | Freq: Three times a day (TID) | ORAL | Status: DC
  Administered 2014-10-10 – 2014-10-11 (×6): 325 mg via ORAL
  Filled 2014-10-10 (×6): qty 1

## 2014-10-10 MED ORDER — PRENATAL MULTIVITAMIN CH
1.0000 | ORAL_TABLET | Freq: Every day | ORAL | Status: DC
  Administered 2014-10-10 – 2014-10-11 (×2): 1 via ORAL
  Filled 2014-10-10 (×3): qty 1

## 2014-10-10 MED ORDER — OXYCODONE-ACETAMINOPHEN 5-325 MG PO TABS
1.0000 | ORAL_TABLET | ORAL | Status: DC | PRN
  Administered 2014-10-10 – 2014-10-11 (×5): 1 via ORAL
  Filled 2014-10-10 (×5): qty 1

## 2014-10-10 NOTE — Progress Notes (Signed)
Post Partum Day 1 Subjective: Patient and husband are appropriately concerned about infant in NICU. Dr. Higinio Roger (Neonatologist) came into the room at the same time as I did to discuss progress.  No complaints. Foley still in place. No much OOB yet. Marland Kitchen Appropriate lochia. No presyncopal symptoms.  Objective: Blood pressure 118/76, pulse 111, temperature 99.4 F (37.4 C), temperature source Oral, resp. rate 16, height 5\' 6"  (1.676 m), weight 172 lb (78.019 kg), last menstrual period 12/30/2013, SpO2 98 %, unknown if currently breastfeeding.  Temp:  [98.3 F (36.8 C)-100.4 F (38 C)] 99.4 F (37.4 C) (05/20 0357) Pulse Rate:  [56-290] 111 (05/20 0357) Resp:  [16-20] 16 (05/20 0357) BP: (98-142)/(42-120) 118/76 mmHg (05/20 0357) SpO2:  [90 %-100 %] 98 % (05/20 0357)  Physical Exam:  General: alert and no distress Lochia: appropriate Uterine Fundus: firm, nontender DVT Evaluation: No evidence of DVT seen on physical exam. Negative Homan's sign.  CBC Latest Ref Rng 10/10/2014 10/09/2014 10/09/2014  WBC 4.0 - 10.5 K/uL 21.9(H) - 21.9(H)  Hemoglobin 12.0 - 15.0 g/dL 8.6(L) - 11.2(L)  Hematocrit 36.0 - 46.0 % 25.0(L) - 32.8(L)  Platelets 150 - 400 K/uL 209 204 204  Admission Hgb 13.0 -> 8.6 after PPH of about 2 L   Assessment/Plan: Appreciate Dr. Theresia Lo in depth discussion with patient and husband about infant's progress in NICU.  Appropriate support given to patient and her husband. Declined SW and Chaplain services for now. Anemia with Hgb of 8.6, will start iron therapy. Will d/c foley this morning, watch output.  Also encourage OOB.  Consider transfusion only if patient is symptomatic. No further fevers, continue Zosyn x 24 hours Encourage breastfeeding Routine postpartum care   LOS: 1 day   Hamza Empson A, MD 10/10/2014, 7:19 AM

## 2014-10-10 NOTE — Progress Notes (Signed)
Patient with temperature of 100.4. Blood pressure 118/74, pulse 73. Given recent uterine evacuation of clots, concerned about intrauterine infection Will treat with Zosyn until afebrile for 24 hours.  Tylenol 1000 mg po to be given now for fever.  [name], MD, [place] Attending [place], [facility]

## 2014-10-10 NOTE — Lactation Note (Signed)
This note was copied from the chart of [place]. Lactation Consultation Note Initial visit made.  Providing Breastmilk For Your Baby in NICU booklet provided.  Mom states she breastfed her first baby for one year and is also familiar with pumping.  Symphony pump set up and reviewed use and settings.  Instructed to pump breasts on premie setting every 3 hours for 15 minutes.  She would like to go visit baby in the NICU and then will start pumping.  Encouraged to call with concerns/assist prn.  Patient Name: Danielle Sawyer [id number] Date: 10/10/2014 Reason for consult: Initial assessment;NICU baby   Maternal Data    Feeding    LATCH Score/Interventions                      Lactation Tools Discussed/Used Pump Review: Setup, frequency, and cleaning;Milk Storage Initiated by:: RN Date initiated:: 10/11/14   Consult Status Consult Status: Follow-up Date: 10/11/14 Follow-up type: In-patient    Ave Filter 10/10/2014, 5:25 PM

## 2014-10-10 NOTE — Anesthesia Postprocedure Evaluation (Signed)
  Anesthesia Post-op Note  Patient: Danielle Sawyer  Procedure(s) Performed: * No procedures listed *  Patient Location: Women's Unit  Anesthesia Type:Epidural  Level of Consciousness: awake, alert , oriented and patient cooperative  Airway and Oxygen Therapy: Patient Spontanous Breathing  Post-op Pain: none  Post-op Assessment: Post-op Vital signs reviewed, Patient's Cardiovascular Status Stable, Respiratory Function Stable, Patent Airway, No headache, No backache, No residual numbness and No residual motor weakness  Post-op Vital Signs: Reviewed and stable  Last Vitals:  Filed Vitals:   10/10/14 0357  BP: 118/76  Pulse: 111  Temp: 37.4 C  Resp: 16    Complications: No apparent anesthesia complications

## 2014-10-10 NOTE — Progress Notes (Signed)
CSW reviewed OB note that parents have declined CSW support today and will leave report for weekend CSW to check on family tomorrow.

## 2014-10-10 NOTE — Progress Notes (Signed)
I offered support to pt and family both on Women's unit and in the NICU (with FOB and pt's sister).  They are still very much in shock and are not in a place where they are able to take in emotional/spiritual support at this time.  They are aware of our ongoing availability.  Please page as needs arise.  [employer] Pager, [phone] 2:56 PM    10/10/14 1400  Clinical Encounter Type  Visited With Patient;Patient and family together  Visit Type Initial  Spiritual Encounters  Spiritual Needs Emotional

## 2014-10-11 DIAGNOSIS — O8612 Endometritis following delivery: Secondary | ICD-10-CM | POA: Diagnosis not present

## 2014-10-11 DIAGNOSIS — O9081 Anemia of the puerperium: Secondary | ICD-10-CM | POA: Diagnosis not present

## 2014-10-11 NOTE — Progress Notes (Signed)
Chaplain referred to Ms Locey to provide spiritual and emotional support in consideration of the status of her newborn child who is currently being held in the NICU. Ms Rizzolo was surrounded by family members and appeared hesitant to speak openly about her anxiety. Chaplain will follow up as needed.  Sallee Lange. Ortha Metts, [place]

## 2014-10-11 NOTE — Progress Notes (Signed)
Post Partum Day 2 Subjective: Rounded on patient in the NICU; she and husband and other family members were around the baby's isolette.  Baby is still intubated, on cooling blanket and on multiple pressors including epinephrine drip in the NICU to [facility] his vital signs.  Patient and family are appropriately concerned about his condition.  Patient is OOB. Appropriate lochia. No presyncopal symptoms.  Objective: Blood pressure 107/63, pulse 93, temperature 98.4 F (36.9 C), temperature source Oral, resp. rate 16, height 5\' 6"  (1.676 m), weight 172 lb (78.019 kg), last menstrual period 12/30/2013, SpO2 99 %, unknown if currently breastfeeding.  Temp:  [98.4 F (36.9 C)-98.7 F (37.1 C)] 98.4 F (36.9 C) (05/21 0551) Pulse Rate:  [93-111] 93 (05/21 0551) Resp:  [16-18] 16 (05/21 0551) BP: (107-112)/(58-66) 107/63 mmHg (05/21 0551) SpO2:  [99 %] 99 % (05/21 0551)  Physical Exam:  General: alert and no distress Lochia: appropriate Uterine Fundus: firm, nontender DVT Evaluation: No evidence of DVT seen on physical exam. Negative Homan's sign.  CBC Latest Ref Rng 10/10/2014 10/09/2014 10/09/2014  WBC 4.0 - 10.5 K/uL 21.9(H) - 21.9(H)  Hemoglobin 12.0 - 15.0 g/dL 8.6(L) - 11.2(L)  Hematocrit 36.0 - 46.0 % 25.0(L) - 32.8(L)  Platelets 150 - 400 K/uL 209 204 204  Admission Hgb 13.0 -> 8.6 after PPH of about 2 L   Assessment/Plan: Support given to patient and family, will continue to follow baby's condition. Asymptomatic postpartum anemia with Hgb of 8.6, continue iron therapy. No further fevers, completed Zosyn x 24 hours course. Routine postpartum care Plan for discharge tomorrow   LOS: 2 days   ANYANWU,UGONNA A, MD 10/11/2014, 9:23 AM

## 2014-10-12 ENCOUNTER — Encounter (HOSPITAL_COMMUNITY): Payer: Self-pay | Admitting: Obstetrics & Gynecology

## 2014-10-12 ENCOUNTER — Inpatient Hospital Stay (HOSPITAL_COMMUNITY): Admission: RE | Admit: 2014-10-12 | Payer: BC Managed Care – PPO | Source: Ambulatory Visit

## 2014-10-12 DIAGNOSIS — O09299 Supervision of pregnancy with other poor reproductive or obstetric history, unspecified trimester: Secondary | ICD-10-CM

## 2014-10-12 DIAGNOSIS — O4590 Premature separation of placenta, unspecified, unspecified trimester: Secondary | ICD-10-CM | POA: Diagnosis not present

## 2014-10-12 MED ORDER — IBUPROFEN 600 MG PO TABS: 600.0000 mg | ORAL_TABLET | Freq: Four times a day (QID) | ORAL | Status: DC

## 2014-10-12 MED ORDER — DOCUSATE SODIUM 100 MG PO CAPS: 100.0000 mg | ORAL_CAPSULE | Freq: Two times a day (BID) | ORAL | Status: DC | PRN

## 2014-10-12 MED ORDER — FERROUS SULFATE 325 (65 FE) MG PO TABS
325.0000 mg | ORAL_TABLET | Freq: Three times a day (TID) | ORAL | Status: DC
Start: 2014-10-12 — End: 2015-03-31

## 2014-10-12 NOTE — Discharge Summary (Signed)
Physician Obstetric Discharge Summary  Patient ID: Danielle Sawyer MRN: [id number] DOB/AGE: Feb 05, 1981 34 y.o.   Date of Admission: 10/09/2014  Date of Discharge:  10/12/2014  Admitting Diagnoses: Premature rupture of membranes at [redacted]w[redacted]d; previous cesarean section desiring trial of labor  Mode of Delivery: Vaginal Birth After Cesarean     Discharge Diagnosis: . VBAC, delivered  . Placental abruption with delivery  . Atonic postpartum hemorrhage with EBL of 2L  . Endometritis following delivery  . Asymptomatic anemia due to postpartum hemorrhage  . Pregnancy with early neonatal death    Intrapartum Procedures: Pitocin augmentation and placement of fetal scalp electrode   Postpartum Procedures: None  Complications:  . Placental abruption with delivery  . Atonic postpartum hemorrhage with EBL of 2L  . Endometritis following delivery  . Asymptomatic anemia due to postpartum hemorrhage  . Pregnancy with early neonatal death    [place] is Sawyer G2P2001 who was admitted on 10/09/14 with PROM at [redacted]w[redacted]d in the setting of previous cesarean delivery desiring TOLAC.  Patient progressed with pitocin augmentation and had Sawyer second stage of labor characterized with recurrent decelerations leading to Sawyer VBAC of Sawyer nonresponsive infant that needed aggressive resuscitation.  For further details of this delivery, please refer to the delivery note below.  Delivery Note 34 y.o. G2P1001 at [redacted]w[redacted]d who presented with PROM, desiring TOLAC. Patient's second stage of labor was characterized by rapid fetal descent to +3 station,  an initial prolonged deceleration, with return to baseline intermittently between contractions when patient started pushing. Neonatology team was called to bedside given recurrent fetal decelerations.   At 8:22 PM Sawyer female infant was delivered via cephalic presentation and was noted to be unresponsive; cord was urgently clamped and cut and infant was handed to  Neonatology team who immediately started resuscitative efforts and the infant was immediately intubated. No heartbeat was detected, Code Apgar was called and CPR was initiated; please refer to the Neontalogist's notes for further details about the resuscitation.   APGARS: 0/0/0; infant's heart rate was noted after 12 minutes of intense resuscitation efforts.  Placenta status: Intact, spontaneous delivery with significant retroplacental blood noted during delivery concerning for abruption.   Cord: 3 vessels with the following complications: loose cord around leg x 1.Cord blood for pH and routine analysis was not able to be collected. Anesthesia: Epidural  Lacerations:  2nd degree perineal and R vaginal wall Suture Repair: 3.0 vicryl Est. Blood Loss (mL):  700 ml including retroplacental blood. Mom to postpartum.  Baby to NICU. Danielle A, MD 10/09/2014, 9:30 PM  The patient was noted to have Sawyer significant placental abruption during delivery leading to EBL of 700 ml.  She was initially stable but then about one hour after delivery, she had Sawyer postpartum hemorrhage of about 1300 ml secondary to lower uterine segment atony.  After clots were evacuated from her uterus and she received Pitocin, Methergine and Misoprostol, the bleeding subsided.  Her hemoglobin was 8.6, down from admission value of 13.0, but she was asymptomatic and placed on oral iron therapy.   Sawyer few hours after delivery, patient had Sawyer temperature of 100.4.  Given manual exploration of her uterus, there was concern about endometritis so she was started on Zosyn and received this for 24 hours.  No further fevers were noted.  The infant was resuscitated after 12 minutes and was taken to NICU, was intubated and placed on multiple pressors to maintain vital signs. As per communication  with Neonatology and patient, the infant had multisytemic organ failure and went into DIC, requiring transfusion of multiple blood products and administration  of cooling blanket and multiple other agents.  On 10/12/2014, the patient and her husband made the decision to withdraw life support and the infant passed away. The patient was supported by multiple family members and friends throughout her admission; she and her family declined SW and [place] services.  She and her family were grieving appropriately at the time of discharge.    By time of discharge on PPD#2, her pain was controlled on oral pain medications; she had appropriate lochia and was ambulating, voiding without difficulty and tolerating regular diet.  She was deemed stable for discharge to home with the plan to follow up in [facility] clinic in about 2 weeks.     Labs: CBC Latest Ref Rng 10/10/2014 10/09/2014 10/09/2014  WBC 4.0 - 10.5 K/uL 21.9(H) - 21.9(H)  Hemoglobin 12.0 - 15.0 g/dL 8.6(L) - 11.2(L)  Hematocrit 36.0 - 46.0 % 25.0(L) - 32.8(L)  Platelets 150 - 400 K/uL 209 204 204   --/--/Sawyer POS (05/19 0520)  Physical exam:  Blood pressure 114/65, pulse 97, temperature 98.1 F (36.7 C), temperature source Oral, resp. rate 18, height 5\' 6"  (1.676 m), weight 172 lb (78.019 kg), last menstrual period 12/30/2013, SpO2 99 %, unknown if currently breastfeeding. CONSTITUTIONAL: Well-developed, well-nourished female in no acute distress.  HENT: Normocephalic, atraumatic, External right and left ear normal. Oropharynx is clear and moist EYES: Conjunctivae and EOM are normal. Pupils are equal, round, and reactive to light. No scleral icterus.  NECK: Normal range of motion, supple, no masses. Normal thyroid.  SKIN: Skin is warm and dry. No rash noted. Not diaphoretic. No erythema. No pallor. [place]: Alert and oriented to person, place, and time. Normal reflexes, muscle tone coordination. No cranial nerve deficit noted. PSYCHIATRIC: Normal mood and affect. Normal behavior. Normal judgment and thought content. CARDIOVASCULAR: Normal heart rate noted RESPIRATORY: Effort and breath sounds  normal, no problems with respiration noted. ABDOMEN: Soft, no distention noted.  PELVIC: Uterine fundus is firm,nontender, appropriate lochia MUSCULOSKELETAL: Normal range of motion. No tenderness. No cyanosis, clubbing, or edema. 2+ distal pulses. No evidence of DVT seen on physical exam.  Discharge Instructions: Per After Visit Summary. Activity: Advance as tolerated. Pelvic rest for 6 weeks.  Also refer to After Visit Summary Diet: Regular Medications:      docusate sodium 100 MG capsule  Commonly known as:  COLACE  Take 1 capsule (100 mg total) by mouth 2 (two) times daily as needed for mild constipation.     ferrous sulfate 325 (65 FE) MG tablet  Take 1 tablet (325 mg total) by mouth 3 (three) times daily with meals.     ibuprofen 600 MG tablet  Commonly known as:  ADVIL,MOTRIN  Take 1 tablet (600 mg total) by mouth every 6 (six) hours.      Discharged Condition: Stable Discharged to: Home Outpatient follow up:  Follow-up Information    Follow up with [facility], MD In 2 weeks.   Specialty:  Obstetrics and Gynecology   Why:  For postpartum check.  Call clinic/come to MAU for any concerning issues   Contact information:   [address] [place] [address] [phone]       [name], MD, [place] Attending [place], [facility]

## 2014-10-12 NOTE — Discharge Instructions (Signed)
Vaginal Delivery, Care After Refer to this sheet in the next few weeks. These discharge instructions provide you with information on caring for yourself after delivery. Your caregiver may also give you specific instructions. Your treatment has been planned according to the most current medical practices available, but problems sometimes occur. Call your caregiver if you have any problems or questions after you go home. HOME CARE INSTRUCTIONS  Take over-the-counter or prescription medicines only as directed by your caregiver or pharmacist.  Do not drink alcohol, especially if you are taking medicine to relieve pain.  Do not chew or smoke tobacco.  Do not use illegal drugs.  Continue to use good perineal care. Good perineal care includes:  Wiping your perineum from front to back.  Keeping your perineum clean.  Do not use tampons or douche until your caregiver says it is okay.  Shower, wash your hair, and take tub baths as directed by your caregiver.  Wear a well-fitting bra that provides breast support.  Eat healthy foods.  Drink enough fluids to keep your urine clear or pale yellow.  Eat high-fiber foods such as whole grain cereals and breads, brown rice, beans, and fresh fruits and vegetables every day. These foods may help prevent or relieve constipation.  Follow your caregiver's recommendations regarding resumption of activities such as climbing stairs, driving, lifting, exercising, or traveling.  Talk to your caregiver about resuming sexual activities. Resumption of sexual activities is dependent upon your risk of infection, your rate of healing, and your comfort and desire to resume sexual activity.  Try to have someone help you with your household activities for at least a few days after you leave the hospital.  Rest as much as possible.   Increase your activities gradually.  Keep all of your scheduled postpartum appointments. It is very important to keep your scheduled  follow-up appointments. At these appointments, your caregiver will be checking to make sure that you are healing physically and emotionally. SEEK MEDICAL CARE IF:   You are passing large clots from your vagina.   You have a foul smelling discharge from your vagina.  You have trouble urinating.  You are urinating frequently.  You have pain when you urinate.  You have a change in your bowel movements.  You have increasing redness, pain, or swelling near your vaginal incision (episiotomy) or vaginal tear.  You have pus draining from your episiotomy or vaginal tear.  Your episiotomy or vaginal tear is separating.  You have painful, hard, or reddened breasts.  You have a severe headache.  You have blurred vision or see spots.  You feel sad or depressed.  You have thoughts of hurting yourself.  You have questions about your care or medicines.  You are dizzy or light-headed.  You have a rash.  You have nausea or vomiting.  You have a fever. SEEK IMMEDIATE MEDICAL CARE IF:   You have persistent pain.  You have chest pain.  You have shortness of breath.  You faint.  You have leg pain.  You have stomach pain.  Your vaginal bleeding saturates two or more sanitary pads in 1 hour. MAKE SURE YOU:   Understand these instructions.  Will watch your condition.  Will get help right away if you are not doing well or get worse. Document Released: 05/06/2000 Document Revised: 09/23/2013 Document Reviewed: 01/04/2012 [facility] Patient Information 2015 [place], [place]. This information is not intended to replace advice given to you by your health care provider. Make sure you discuss  any questions you have with your health care provider. ° °

## 2014-10-12 NOTE — Progress Notes (Signed)

## 2014-10-13 ENCOUNTER — Encounter: Payer: Self-pay | Admitting: Obstetrics and Gynecology

## 2014-10-13 ENCOUNTER — Ambulatory Visit (INDEPENDENT_AMBULATORY_CARE_PROVIDER_SITE_OTHER): Payer: BC Managed Care – PPO | Admitting: Obstetrics and Gynecology

## 2014-10-13 ENCOUNTER — Ambulatory Visit: Payer: BC Managed Care – PPO | Admitting: Obstetrics and Gynecology

## 2014-10-13 ENCOUNTER — Telehealth: Payer: Self-pay | Admitting: Obstetrics & Gynecology

## 2014-10-13 VITALS — BP 134/83 | HR 134 | Temp 97.2°F | Wt 154.0 lb

## 2014-10-13 DIAGNOSIS — O864 Pyrexia of unknown origin following delivery: Secondary | ICD-10-CM

## 2014-10-13 LAB — TYPE AND SCREEN
ABO/RH(D): A POS
Antibody Screen: NEGATIVE
UNIT DIVISION: 0
Unit division: 0

## 2014-10-13 NOTE — Progress Notes (Signed)
Patient ID: Danielle Sawyer, female   DOB: Mar 27, 1981, 34 y.o.   MRN: [id number] Erroneous encounter

## 2014-10-13 NOTE — Telephone Encounter (Signed)
Spoke with Danton Clap and she will come to the office today at 1:00 to be evaluated by Dr. Elly Modena.

## 2014-10-13 NOTE — Progress Notes (Signed)
Patient ID: Danielle Sawyer, female   DOB: [birth year], 34 y.o.   MRN: [id number] 34 yo s/p VBAC on 5/19 presenting today for evaluation of low grade fevers at home and pain at the site of vaginal laceration. Patient reports hot and cold sweats overnight with a maximal temperature of 100.5 which she treated with motrin. She denies any breast or abdominal pain.she reports an appropriate lochia. She has not taken any more ibuprofen since yesterday evening  Filed Vitals:   10/13/14 1313  BP: 134/83  Pulse: 134  Temp: 97.2 F (36.2 C)   GENERAL: Well-developed, well-nourished female in no acute distress.  BREASTS: Symmetric in size. No palpable masses or lymphadenopathy, skin changes, or nipple drainage. ABDOMEN: Soft, nontender, nondistended. Fundus is firm and not tender PELVIC: Normal external female genitalia. Vagina is pink and rugated. Laceration site is intact without any evidence of breakdown, appropriately tender to touch EXTREMITIES: No cyanosis, clubbing, or edema, 2+ distal pulses.  A/P 34 yo PPD#4 s/p VBAC - Patient healing appropriately - No evidence of infection at this time - Will send a urine culture - Advised patient to continue monitoring fever and to return to office or MAU if fever is greater than or equal to 100.4 - Follow up as scheduled on 10/23/2014

## 2014-10-13 NOTE — Addendum Note (Signed)
Addended by: Erik Obey on: 10/13/2014 03:18 PM   Modules accepted: Orders

## 2014-10-13 NOTE — Telephone Encounter (Signed)
Faculty Practice OB/GYN Attending Phone Call Documentation  I placed a call to  Danielle Sawyer to check in with her today.  She reported that she had a low grade fever to 100.5 overnight with sweats.  No breast or uterine tenderness, no UTI symptoms. Reports pain around repaired perineal laceration site. Normal lochia.  She was told to go the [facility] clinic this afternoon for evaluation; clinic staff and Attending (Dr. Elly Modena) notified.    [name], MD, [place] Attending [place], [place]

## 2014-10-14 ENCOUNTER — Telehealth: Payer: Self-pay | Admitting: Obstetrics & Gynecology

## 2014-10-14 NOTE — Telephone Encounter (Signed)
Faculty Practice OB/GYN Attending Phone Call Documentation  I placed a call to Danielle Sawyer to check in with her today. She was evaluated at the [facility] clinic yesterday, please refer to Dr. Domenic Schwab notes for more details.  Denies any fevers overnight, just passed some blood clots per vagina earlier this morning, no ongoing bleeding or clots currently. Denies any lightheadedness, dizziness or other symptoms.  She was told to continue to monitor her bleeding and to call or go to [facility] clinic with any worsening bleeding or other concerning symptoms. Bleeding precautions reviewed.  When asked how she was coping, she reported that she and her husband were taking it "one day at a time"  Will continue to check in on patient. She does have a clinic appointment scheduled on 10/23/14.   [name], MD, [place] Attending [place], [place]

## 2014-10-15 ENCOUNTER — Telehealth: Payer: Self-pay | Admitting: Obstetrics & Gynecology

## 2014-10-15 LAB — CULTURE, URINE COMPREHENSIVE
COLONY COUNT: NO GROWTH
Organism ID, Bacteria: NO GROWTH

## 2014-10-15 NOTE — Telephone Encounter (Signed)
Faculty Practice OB/GYN Attending Phone Call Documentation  I placed a call to Danielle Sawyer to check in with her today. She denies any further blood clots or other concerning postpartum symptoms.  She and her husband are coping well.     Will continue to check in on patient. She does have a clinic appointment scheduled on 10/23/14.   [name], MD, [place] Attending [place], [place]

## 2014-10-19 ENCOUNTER — Encounter: Payer: Self-pay | Admitting: *Deleted

## 2014-10-23 ENCOUNTER — Ambulatory Visit (INDEPENDENT_AMBULATORY_CARE_PROVIDER_SITE_OTHER): Payer: BC Managed Care – PPO | Admitting: Obstetrics & Gynecology

## 2014-10-23 ENCOUNTER — Encounter: Payer: Self-pay | Admitting: Obstetrics & Gynecology

## 2014-10-23 VITALS — BP 142/83 | HR 114 | Ht 66.0 in | Wt 148.2 lb

## 2014-10-23 DIAGNOSIS — IMO0002 Reserved for concepts with insufficient information to code with codable children: Secondary | ICD-10-CM

## 2014-10-23 NOTE — Progress Notes (Signed)
   Subjective:    Patient ID: Danielle Sawyer, female    DOB: [birth year], 34 y.o.   MRN: [id number]  HPI  Mrs. Muckey is here about a week postpartum s/p VBAC with Apgars of 0, 0,0, 3. The baby died on DOL#2. She denies any physical or emotional problems today. She will discuss possible contraception in 6 weeks.  Review of Systems     Objective:   Physical Exam  [id number] Breathing and ambulating normally Her affect is her usual affect      Assessment & Plan:  Post partum with fetal loss- stable RTC 6 weeks

## 2014-11-06 ENCOUNTER — Ambulatory Visit (HOSPITAL_COMMUNITY): Payer: BC Managed Care – PPO | Admitting: Anesthesiology

## 2014-11-06 ENCOUNTER — Encounter: Payer: Self-pay | Admitting: Obstetrics & Gynecology

## 2014-11-06 ENCOUNTER — Encounter (HOSPITAL_COMMUNITY): Payer: Self-pay | Admitting: *Deleted

## 2014-11-06 ENCOUNTER — Ambulatory Visit (HOSPITAL_COMMUNITY)
Admission: AD | Admit: 2014-11-06 | Discharge: 2014-11-06 | Disposition: A | Discharge status: Home / Self Care | Payer: BC Managed Care – PPO | Source: Ambulatory Visit | Attending: Obstetrics & Gynecology | Admitting: Obstetrics & Gynecology

## 2014-11-06 ENCOUNTER — Ambulatory Visit (HOSPITAL_COMMUNITY): Payer: BC Managed Care – PPO

## 2014-11-06 ENCOUNTER — Ambulatory Visit (INDEPENDENT_AMBULATORY_CARE_PROVIDER_SITE_OTHER): Payer: BC Managed Care – PPO | Admitting: Obstetrics & Gynecology

## 2014-11-06 ENCOUNTER — Encounter (HOSPITAL_COMMUNITY)
Admission: AD | Disposition: A | Discharge status: Home / Self Care | Payer: Self-pay | Source: Ambulatory Visit | Attending: Obstetrics & Gynecology

## 2014-11-06 DIAGNOSIS — D500 Iron deficiency anemia secondary to blood loss (chronic): Secondary | ICD-10-CM | POA: Insufficient documentation

## 2014-11-06 DIAGNOSIS — Z803 Family history of malignant neoplasm of breast: Secondary | ICD-10-CM | POA: Diagnosis not present

## 2014-11-06 DIAGNOSIS — Z3A40 40 weeks gestation of pregnancy: Secondary | ICD-10-CM | POA: Insufficient documentation

## 2014-11-06 DIAGNOSIS — Z800 Family history of malignant neoplasm of digestive organs: Secondary | ICD-10-CM | POA: Diagnosis not present

## 2014-11-06 DIAGNOSIS — Z8249 Family history of ischemic heart disease and other diseases of the circulatory system: Secondary | ICD-10-CM | POA: Insufficient documentation

## 2014-11-06 DIAGNOSIS — Z801 Family history of malignant neoplasm of trachea, bronchus and lung: Secondary | ICD-10-CM | POA: Diagnosis not present

## 2014-11-06 DIAGNOSIS — O9081 Anemia of the puerperium: Secondary | ICD-10-CM | POA: Diagnosis not present

## 2014-11-06 HISTORY — PX: DILATION AND EVACUATION: SHX1459

## 2014-11-06 LAB — CBC
HEMATOCRIT: 36.6 % (ref 36.0–46.0)
Hemoglobin: 11.9 g/dL — ABNORMAL LOW (ref 12.0–15.0)
MCH: 33.4 pg (ref 26.0–34.0)
MCHC: 32.5 g/dL (ref 30.0–36.0)
MCV: 102.8 fL — AB (ref 78.0–100.0)
Platelets: 302 10*3/uL (ref 150–400)
RBC: 3.56 MIL/uL — ABNORMAL LOW (ref 3.87–5.11)
RDW: 12.8 % (ref 11.5–15.5)
WBC: 9 10*3/uL (ref 4.0–10.5)

## 2014-11-06 SURGERY — DILATION AND EVACUATION, UTERUS
Anesthesia: General | Site: Uterus

## 2014-11-06 MED ORDER — PROPOFOL 10 MG/ML IV BOLUS
INTRAVENOUS | Status: DC | PRN
  Administered 2014-11-06: 180 mg via INTRAVENOUS

## 2014-11-06 MED ORDER — PROPOFOL 10 MG/ML IV BOLUS
INTRAVENOUS | Status: AC
  Filled 2014-11-06: qty 20

## 2014-11-06 MED ORDER — METHYLERGONOVINE MALEATE 0.2 MG/ML IJ SOLN
INTRAMUSCULAR | Status: DC | PRN
  Administered 2014-11-06: 0.2 mg via INTRAMUSCULAR

## 2014-11-06 MED ORDER — DOCUSATE SODIUM 100 MG PO CAPS: 100.0000 mg | ORAL_CAPSULE | Freq: Two times a day (BID) | ORAL | Status: DC | PRN

## 2014-11-06 MED ORDER — BUPIVACAINE HCL (PF) 0.5 % IJ SOLN
INTRAMUSCULAR | Status: AC
  Filled 2014-11-06: qty 30

## 2014-11-06 MED ORDER — ONDANSETRON HCL 4 MG/2ML IJ SOLN
INTRAMUSCULAR | Status: DC | PRN
  Administered 2014-11-06: 4 mg via INTRAVENOUS

## 2014-11-06 MED ORDER — DEXAMETHASONE SODIUM PHOSPHATE 4 MG/ML IJ SOLN
INTRAMUSCULAR | Status: AC
  Filled 2014-11-06: qty 1

## 2014-11-06 MED ORDER — AMOXICILLIN-POT CLAVULANATE 875-125 MG PO TABS
1.0000 | ORAL_TABLET | Freq: Two times a day (BID) | ORAL | Status: DC
Start: 2014-11-06 — End: 2014-11-18

## 2014-11-06 MED ORDER — SUCCINYLCHOLINE CHLORIDE 20 MG/ML IJ SOLN
INTRAMUSCULAR | Status: AC
  Filled 2014-11-06: qty 1

## 2014-11-06 MED ORDER — LIDOCAINE HCL (CARDIAC) 20 MG/ML IV SOLN
INTRAVENOUS | Status: DC | PRN
  Administered 2014-11-06: 70 mg via INTRAVENOUS
  Administered 2014-11-06: 30 mg via INTRAVENOUS

## 2014-11-06 MED ORDER — LIDOCAINE HCL (CARDIAC) 20 MG/ML IV SOLN
INTRAVENOUS | Status: AC
  Filled 2014-11-06: qty 5

## 2014-11-06 MED ORDER — KETOROLAC TROMETHAMINE 30 MG/ML IJ SOLN
INTRAMUSCULAR | Status: DC | PRN
  Administered 2014-11-06: 30 mg via INTRAVENOUS

## 2014-11-06 MED ORDER — ONDANSETRON HCL 4 MG/2ML IJ SOLN
INTRAMUSCULAR | Status: AC
  Filled 2014-11-06: qty 2

## 2014-11-06 MED ORDER — SUCCINYLCHOLINE CHLORIDE 20 MG/ML IJ SOLN
INTRAMUSCULAR | Status: DC | PRN
  Administered 2014-11-06: 140 mg via INTRAVENOUS

## 2014-11-06 MED ORDER — FENTANYL CITRATE (PF) 100 MCG/2ML IJ SOLN: 25.0000 ug | INTRAMUSCULAR | Status: DC | PRN

## 2014-11-06 MED ORDER — DEXAMETHASONE SODIUM PHOSPHATE 4 MG/ML IJ SOLN
INTRAMUSCULAR | Status: DC | PRN
  Administered 2014-11-06: 4 mg via INTRAVENOUS

## 2014-11-06 MED ORDER — FENTANYL CITRATE (PF) 100 MCG/2ML IJ SOLN
INTRAMUSCULAR | Status: DC | PRN
  Administered 2014-11-06: 50 ug via INTRAVENOUS

## 2014-11-06 MED ORDER — LACTATED RINGERS IV SOLN
INTRAVENOUS | Status: DC
  Administered 2014-11-06 (×2): via INTRAVENOUS

## 2014-11-06 MED ORDER — OXYCODONE-ACETAMINOPHEN 5-325 MG PO TABS: 1.0000 | ORAL_TABLET | Freq: Four times a day (QID) | ORAL | Status: DC | PRN

## 2014-11-06 MED ORDER — BUPIVACAINE HCL 0.5 % IJ SOLN
INTRAMUSCULAR | Status: DC | PRN
  Administered 2014-11-06: 30 mL

## 2014-11-06 MED ORDER — FENTANYL CITRATE (PF) 100 MCG/2ML IJ SOLN
INTRAMUSCULAR | Status: AC
  Filled 2014-11-06: qty 2

## 2014-11-06 MED ORDER — MIDAZOLAM HCL 2 MG/2ML IJ SOLN
INTRAMUSCULAR | Status: AC
  Filled 2014-11-06: qty 2

## 2014-11-06 MED ORDER — IBUPROFEN 600 MG PO TABS: 600.0000 mg | ORAL_TABLET | Freq: Four times a day (QID) | ORAL | Status: DC | PRN

## 2014-11-06 MED ORDER — DOXYCYCLINE HYCLATE 100 MG IV SOLR
200.0000 mg | INTRAVENOUS | Status: AC
  Administered 2014-11-06: 200 mg via INTRAVENOUS
  Filled 2014-11-06 (×2): qty 200

## 2014-11-06 MED ORDER — MIDAZOLAM HCL 5 MG/5ML IJ SOLN
INTRAMUSCULAR | Status: DC | PRN
  Administered 2014-11-06: 2 mg via INTRAVENOUS

## 2014-11-06 SURGICAL SUPPLY — 21 items
CATH ROBINSON RED A/P 16FR (CATHETERS) ×3 IMPLANT
CLOTH BEACON ORANGE TIMEOUT ST (SAFETY) ×3 IMPLANT
DECANTER SPIKE VIAL GLASS SM (MISCELLANEOUS) ×3 IMPLANT
GLOVE BIOGEL PI IND STRL 7.0 (GLOVE) ×2 IMPLANT
GLOVE BIOGEL PI INDICATOR 7.0 (GLOVE) ×4
GLOVE ECLIPSE 7.0 STRL STRAW (GLOVE) ×3 IMPLANT
GLOVE INDICATOR 7.0 STRL GRN (GLOVE) ×6 IMPLANT
GLOVE SURG SS PI 7.0 STRL IVOR (GLOVE) ×9 IMPLANT
GOWN STRL REUS W/TWL LRG LVL3 (GOWN DISPOSABLE) ×6 IMPLANT
KIT BERKELEY 1ST TRIMESTER 3/8 (MISCELLANEOUS) ×3 IMPLANT
NS IRRIG 1000ML POUR BTL (IV SOLUTION) ×3 IMPLANT
PACK VAGINAL MINOR WOMEN LF (CUSTOM PROCEDURE TRAY) ×3 IMPLANT
PAD OB MATERNITY 4.3X12.25 (PERSONAL CARE ITEMS) ×3 IMPLANT
PAD PREP 24X48 CUFFED NSTRL (MISCELLANEOUS) ×3 IMPLANT
SET BERKELEY SUCTION TUBING (SUCTIONS) ×3 IMPLANT
TOWEL OR 17X24 6PK STRL BLUE (TOWEL DISPOSABLE) ×6 IMPLANT
VACURETTE 10 RIGID CVD (CANNULA) IMPLANT
VACURETTE 6 ASPIR F TIP BERK (CANNULA) IMPLANT
VACURETTE 7MM CVD STRL WRAP (CANNULA) IMPLANT
VACURETTE 8 RIGID CVD (CANNULA) IMPLANT
VACURETTE 9 RIGID CVD (CANNULA) ×3 IMPLANT

## 2014-11-06 NOTE — Anesthesia Procedure Notes (Signed)
Procedure Name: Intubation Date/Time: 11/06/2014 1:29 PM Performed by: Tobin Chad Pre-anesthesia Checklist: Emergency Drugs available, Timeout performed, Suction available, Patient identified and Patient being monitored Patient Re-evaluated:Patient Re-evaluated prior to inductionOxygen Delivery Method: Simple face mask and Circle system utilized Preoxygenation: Pre-oxygenation with 100% oxygen Intubation Type: IV induction, Cricoid Pressure applied and Rapid sequence Laryngoscope Size: Mac and 3 Grade View: Grade II Tube type: Oral Tube size: 7.0 mm Number of attempts: 1 Placement Confirmation: ETT inserted through vocal cords under direct vision,  positive ETCO2 and breath sounds checked- equal and bilateral Secured at: 22 cm Tube secured with: Tape

## 2014-11-06 NOTE — Op Note (Signed)
Danielle Sawyer PROCEDURE DATE:  11/06/2014  PREOPERATIVE DIAGNOSIS: Retained products of conception after delivery on 10/09/14 with associated hemorrhage POSTOPERATIVE DIAGNOSIS: The same, possible endometritis PROCEDURE:   Dilation and Curettage under ultrasound guidance SURGEON:  Dr. [name]  INDICATIONS: 34 y.o.  G2P2001 with retained products of conception and hemorrhage after VBAC on 10/09/14, needing urgent surgical completion.  Risks of surgery were discussed with the patient and her friend including but not limited to: bleeding which may require transfusion; infection which may require antibiotics; injury to uterus or surrounding organs; need for additional procedures including laparotomy or laparoscopy; possibility of intrauterine scarring which may impair future fertility; and other postoperative/anesthesia complications. Written informed consent was obtained.  FINDINGS:  Small amount of retained products of conception/membrane fragments, no significant placental fragments noted.  Moderate bleeding noted during procedure, uterus also noted to be very soft concerning for possible inflammation due to endometritis.  ANESTHESIA:    General, paracervical block with 30 ml of 0.5% Marcaine INTRAVENOUS FLUIDS:  1400 ml of LR ESTIMATED BLOOD LOSS:  75 ml during procedure SPECIMENS:  Products of conception COMPLICATIONS:  None immediate.  PROCEDURE DETAILS:  The patient was then taken to the operating room where general anesthesia was administered and was found to be adequate.  A paracervical block using 30 ml of 0.5% Marcaine was administered. After an adequate timeout was performed, she was placed in the dorsal lithotomy position and examined; then prepped and draped in the sterile manner.   A vaginal speculum was then placed in the patient's vagina and a ring forceps was applied to the anterior lip of the cervix.  The cervix was already about 1 cm dilated.  A 10 mm suction curette was  then advanced into the uterus under ultrasound guidance, the suction device was then activated and curette slowly rotated to clear further products.  A sharp curettage was then performed to confirm complete emptying of the uterus. There was moderate bleeding noted so IM Methergine was administered.   The bleeding was noted to subside significantly. All instruments were removed from the patient's vagina.  Sponge and instrument counts were correct times two.  The patient tolerated the procedure well and was taken to the recovery area awake, extubated and in stable condition.  The patient will be discharged to home as per PACU criteria.  Routine postoperative instructions given.  She was prescribed Percocet, Ibuprofen and Colace.  She was also prescribed Augmentin 875/125 mg twice daily for ten days to treat presumptive endometritis.  She will follow up in the clinic on 11/18/14 for her scheduled six week postpartum check and postoperative evaluation .     [name], MD, [place] Attending [place], [facility]

## 2014-11-06 NOTE — Discharge Instructions (Signed)
DISCHARGE INSTRUCTIONS: D&C / D&E The following instructions have been prepared to help you care for yourself upon your return home.   Personal hygiene:  Use sanitary pads for vaginal drainage, not tampons.  Shower the day after your procedure.  NO tub baths, pools or Jacuzzis for 2-3 weeks.  Wipe front to back after using the bathroom.  Activity and limitations:  Do NOT drive or operate any equipment for 24 hours. The effects of anesthesia are still present and drowsiness may result.  Do NOT rest in bed all day.  Walking is encouraged.  Walk up and down stairs slowly.  You may resume your normal activity in one to two days or as indicated by your physician.  Sexual activity: NO intercourse for at least 2 weeks after the procedure, or as indicated by your physician.  Diet: Eat a light meal as desired this evening. You may resume your usual diet tomorrow.  Return to work: You may resume your work activities in one to two days or as indicated by your doctor.  What to expect after your surgery: Expect to have vaginal bleeding/discharge for 2-3 days and spotting for up to 10 days. It is not unusual to have soreness for up to 1-2 weeks. You may have a slight burning sensation when you urinate for the first day. Mild cramps may continue for a couple of days. You may have a regular period in 2-6 weeks.  NO IBUPROFEN PRODUCTS (MOTRIN, ADVIL) OR ALEVE UNTIL 7:30 PM TODAY.  Call your doctor for any of the following:  Excessive vaginal bleeding, saturating and changing one pad every hour.  Inability to urinate 6 hours after discharge from hospital.  Pain not relieved by pain medication.  Fever of 100.4 F or greater.  Unusual vaginal discharge or odor.   Call for an appointment:    Patients signature: ______________________  Nurses signature ________________________  Support person's signature_______________________

## 2014-11-06 NOTE — H&P (Signed)
Preoperative History and Physical  Danielle Sawyer is a 34 y.o. G2P2001 here for surgical management of possible retained products of conception (RPOC) with delayed postpartum hemorrhage s/p delivery on 10/09/14. Was seen in clinic earlier today, some placental products were removed from patient's vagina and brisk bleeding was noted. On arrival here, bleeding has subsided but still concerning for possible RPOCs.   No other significant preoperative concerns.  Proposed surgery: Dilation and Evacuation under ultrasound guidance  Past Medical History  Diagnosis Date  . Medical history non-contributory    Past Surgical History  Procedure Laterality Date  . Wisdom tooth extraction      x3  . Cesarean section N/A 12/19/2012    Procedure: CESAREAN SECTION;  Surgeon: Woodroe Mode, MD;  Location: [facility];  Service: Obstetrics;  Laterality: N/A;  primary   OB History  Gravida Para Term Preterm AB SAB TAB Ectopic Multiple Living  2 2 2       0 1    # Outcome Date GA Lbr Len/2nd Weight Sex Delivery Anes PTL Lv  2 Term 10/09/14 [redacted]w[redacted]d 16:44 / 00:38 6 lb 3.1 oz (2.81 kg) M VBAC EPI       Complications: Abruptio Placenta,Other Excessive Bleeding  1 Term 12/19/12 [redacted]w[redacted]d  6 lb 9.5 oz (2.99 kg) M CS-LTranv Spinal  Y    Obstetric Comments  Neonatal demise on Day 3 of life  Patient denies any other pertinent gynecologic issues.   No current facility-administered medications on file prior to encounter.   Current Outpatient Prescriptions on File Prior to Encounter  Medication Sig Dispense Refill  . ibuprofen (ADVIL,MOTRIN) 600 MG tablet Take 1 tablet (600 mg total) by mouth every 6 (six) hours. 60 tablet 3  . docusate sodium (COLACE) 100 MG capsule Take 1 capsule (100 mg total) by mouth 2 (two) times daily as needed for mild constipation. (Patient not taking: Reported on 10/13/2014) 10 capsule 0  . ferrous sulfate 325 (65 FE) MG tablet Take 1 tablet (325 mg total) by mouth 3 (three) times daily with meals.  90 tablet 3   No Known Allergies  Social History:   reports that she has never smoked. She has never used smokeless tobacco. She reports that she does not drink alcohol or use illicit drugs.  Family History  Problem Relation Age of Onset  . Heart disease Father     quad bypass surgery  . Cancer Paternal Aunt     pancreatic  . Cancer Maternal Grandmother     breast  . Cancer Maternal Grandfather     lung  . Cancer Paternal Grandmother     breast  . Cancer Paternal Grandfather   . Cancer Paternal Aunt     Review of Systems: Noncontributory  PHYSICAL EXAM: Blood pressure 133/67, pulse 79, temperature 98.2 F (36.8 C), temperature source Oral, resp. rate 20, SpO2 100 %, not currently breastfeeding. CONSTITUTIONAL: Well-developed, well-nourished female in no acute distress.  HENT:  Normocephalic, atraumatic, External right and left ear normal. Oropharynx is clear and moist EYES: Conjunctivae and EOM are normal. Pupils are equal, round, and reactive to light. No scleral icterus.  NECK: Normal range of motion, supple, no masses SKIN: Skin is warm and dry. No rash noted. Not diaphoretic. No erythema. No pallor. [place]: Alert and oriented to person, place, and time. Normal reflexes, muscle tone coordination. No cranial nerve deficit noted. PSYCHIATRIC: Normal mood and affect. Normal behavior. Normal judgment and thought content. CARDIOVASCULAR: Normal heart rate noted, regular rhythm  RESPIRATORY: Effort and breath sounds normal, no problems with respiration noted ABDOMEN: Soft, mildly tender to palpation, nondistended. PELVIC: Moderate amount of blood noted on pad MUSCULOSKELETAL: Normal range of motion. No edema and no tenderness. 2+ distal pulses.   Labs: CBC Latest Ref Rng 11/06/2014 10/10/2014 10/09/2014  WBC 4.0 - 10.5 K/uL 9.0 21.9(H) -  Hemoglobin 12.0 - 15.0 g/dL 11.9(L) 8.6(L) -  Hematocrit 36.0 - 46.0 % 36.6 25.0(L) -  Platelets 150 - 400 K/uL 302 209 204     Assessment: Patient Active Problem List   Diagnosis Date Noted  . Retained products of conception, following delivery with hemorrhage 11/06/2014  . Placental abruption, with delivery 10-25-14  . Pregnancy with early neonatal death 10/25/2014  . 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 Endometritis following delivery 10/11/2014  . Asymptomatic anemia due to postpartum hemorrhage 10/11/2014    Plan: Patient will undergo surgical management with Dilation and Evacuation under ultrasound guidance. Given the amount of blood loss patient had over the last few days and today, will need to proceed with procedure as soon as possible.  This was communicated to Dr. Glennon Mac (Anesthesiology).   Risks of surgery including bleeding, infection, injury to surrounding organs, need for additional procedures, possibility of intrauterine scarring which may impair future fertility, risk of retained products which may require further management and other postoperative/anesthesia complications were explained to patient.  Likelihood of success of complete evacuation of the uterus was discussed with the patient.  Written informed consent was obtained.  Patient has been NPO since 0730,  she will remain NPO for procedure. Anesthesia and OR aware.  Preoperative prophylactic Doxycycline 200mg  IV  has been ordered and is on call to the OR.  To OR when ready.    [name], M.D. 11/06/2014 12:56 PM

## 2014-11-06 NOTE — Transfer of Care (Signed)
Immediate Anesthesia Transfer of Care Note  Patient: Danielle Sawyer  Procedure(s) Performed: Procedure(s): DILATATION AND EVACUATION UNDER ULTRASOUND GUIDANCE (N/A)  Patient Location: PACU  Anesthesia Type:General  Level of Consciousness: awake  Airway & Oxygen Therapy: Patient Spontanous Breathing and Patient connected to nasal cannula oxygen  Post-op Assessment: Report given to RN and Post -op Vital signs reviewed and stable  Post vital signs: Reviewed and stable  Last Vitals:  Filed Vitals:   11/06/14 1145  BP: 133/67  Pulse: 79  Temp: 36.8 C  Resp: 20    Complications: No apparent anesthesia complications

## 2014-11-06 NOTE — Anesthesia Preprocedure Evaluation (Addendum)
Anesthesia Evaluation  Patient identified by MRN, date of birth, ID band Patient awake    Reviewed: Allergy & Precautions, H&P , Patient's Chart, lab work & pertinent test results, reviewed documented beta blocker date and time   Airway Mallampati: II  TM Distance: >3 FB Neck ROM: full    Dental no notable dental hx.    Pulmonary  breath sounds clear to auscultation  Pulmonary exam normal       Cardiovascular Rhythm:regular Rate:Normal     Neuro/Psych    GI/Hepatic   Endo/Other    Renal/GU      Musculoskeletal   Abdominal   Peds  Hematology   Anesthesia Other Findings BP stable. And pt walked in under her own power. Hb 12. She appears to be stable. Discussion w/ Dr Harolyn Rutherford; she feels this is emergent based on the briskness of her bleeding. I offered regional to the patient to circumvent FS concerns. Pt prefers GA. Discussed RSI w/ Pt and family present.  Reproductive/Obstetrics                            Anesthesia Physical Anesthesia Plan  ASA: II and emergent  Anesthesia Plan: General   Post-op Pain Management:    Induction: Intravenous, Rapid sequence and Cricoid pressure planned  Airway Management Planned: Oral ETT  Additional Equipment:   Intra-op Plan:   Post-operative Plan: Extubation in OR  Informed Consent: I have reviewed the patients History and Physical, chart, labs and discussed the procedure including the risks, benefits and alternatives for the proposed anesthesia with the patient or authorized representative who has indicated his/her understanding and acceptance.   Dental Advisory Given and Dental advisory given  Plan Discussed with: CRNA and Surgeon  Anesthesia Plan Comments: (  Discussed general anesthesia, including possible nausea, instrumentation of airway, sore throat,pulmonary aspiration, etc. I asked if the were any outstanding questions, or  concerns  before we proceeded. )       Anesthesia Quick Evaluation

## 2014-11-07 NOTE — Progress Notes (Signed)
   Subjective:    Patient ID: Danielle Sawyer, female    DOB: 01/09/1981, 34 y.o.   MRN: [id number]  HPI  This 34 yo MW P2 now about a month after a NSVD with sadly a fetal demise is here due to several days.    Review of Systems     Objective:   Physical Exam  One exam she is having brisk BRVB and placental tissue was noted at the open cervix. I used a ring forcep to remove a fist size piece of placenta. I did a bedside u/s that showed a large amount of either clots or placenta.      Assessment & Plan:  Retained POC with heavy VB. She will need a d&c today at [facility].

## 2014-11-08 ENCOUNTER — Encounter (HOSPITAL_COMMUNITY): Payer: Self-pay | Admitting: Obstetrics & Gynecology

## 2014-11-10 NOTE — Anesthesia Postprocedure Evaluation (Signed)
  Anesthesia Post-op Note  Patient: Danielle Sawyer  Procedure(s) Performed: Procedure(s): DILATATION AND EVACUATION UNDER ULTRASOUND GUIDANCE (N/A)  Patient is awake and responsive. Pain and nausea are reasonably well controlled. Vital signs are stable and clinically acceptable. Oxygen saturation is clinically acceptable. There are no apparent anesthetic complications at this time. Patient is ready for discharge.

## 2014-11-18 ENCOUNTER — Ambulatory Visit (INDEPENDENT_AMBULATORY_CARE_PROVIDER_SITE_OTHER): Payer: BC Managed Care – PPO | Admitting: Family Medicine

## 2014-11-18 ENCOUNTER — Encounter: Payer: Self-pay | Admitting: Family Medicine

## 2014-11-18 VITALS — BP 132/83 | HR 65 | Resp 16 | Ht 66.0 in | Wt 146.0 lb

## 2014-11-18 DIAGNOSIS — R61 Generalized hyperhidrosis: Secondary | ICD-10-CM

## 2014-11-18 NOTE — Progress Notes (Signed)
  Subjective:     Danielle Sawyer is a 34 y.o. female who presents for a postpartum visit. She is 6 weeks postpartum following a VBAC complicated by neonatal demise with probable abruption and pp hemorrhage. Postpartum course complicated by retained placenta and hemorrhage with subsequent D & C. I have fully reviewed the prenatal and intrapartum course. The delivery was at 40 gestational weeks. Outcome: vaginal birth after cesarean (VBAC). Anesthesia: epidural.Bleeding no bleeding. Bowel function is normal. Bladder function is normal. Patient is not sexually active. Contraception method is none. Postpartum depression screening: negative.  The following portions of the patient's history were reviewed and updated as appropriate: allergies, current medications, past family history, past medical history, past social history, past surgical history and problem list.  Review of Systems Pertinent items are noted in HPI.   Objective:    BP 132/83 mmHg  Pulse 65  Resp 16  Ht 5\' 6"  (1.676 m)  Wt 146 lb (66.225 kg)  BMI 23.58 kg/m2  Breastfeeding? No  General:  alert, cooperative, appears stated age and got tearful at times during exam  Abdomen: soft, non-tender; bowel sounds normal; no masses,  no organomegaly   Vulva:  normal and suture removed  Vagina: normal vagina  Cervix:  multiparous appearance, no cervical motion tenderness and no lesions  Corpus: normal size, contour, position, consistency, mobility, non-tender  Adnexa:  no mass, fullness, tenderness        Assessment:    Normal postpartum exam. Pap smear not done at today's visit.   Plan:    1. Contraception: OCP (estrogen/progesterone) she will return call to [place], letting us know which OC she prefers. 2. She may try to get pregnant again soon, she is still undecided.  A lot of time was spent discussing her circumstances, what we thought happened, chances of recurrence. She is undecided if she will try again soon or not. 3. Follow up  in: 3 months or as needed.

## 2014-11-18 NOTE — Patient Instructions (Signed)
Oral Contraception Use Oral contraceptive pills (OCPs) are medicines taken to prevent pregnancy. OCPs work by preventing the ovaries from releasing eggs. The hormones in OCPs also cause the cervical mucus to thicken, preventing the sperm from entering the uterus. The hormones also cause the uterine lining to become thin, not allowing a fertilized egg to attach to the inside of the uterus. OCPs are highly effective when taken exactly as prescribed. However, OCPs do not prevent sexually transmitted diseases (STDs). Safe sex practices, such as using condoms along with an OCP, can help prevent STDs. Before taking OCPs, you may have a physical exam and Pap test. Your health care provider may also order blood tests if necessary. Your health care provider will make sure you are a good candidate for oral contraception. Discuss with your health care provider the possible side effects of the OCP you may be prescribed. When starting an OCP, it can take 2 to 3 months for the body to adjust to the changes in hormone levels in your body.  HOW TO TAKE ORAL CONTRACEPTIVE PILLS Your health care provider may advise you on how to start taking the first cycle of OCPs. Otherwise, you can:   Start on day 1 of your menstrual period. You will not need any backup contraceptive protection with this start time.   Start on the first Sunday after your menstrual period or the day you get your prescription. In these cases, you will need to use backup contraceptive protection for the first week.   Start the pill at any time of your cycle. If you take the pill within 5 days of the start of your period, you are protected against pregnancy right away. In this case, you will not need a backup form of birth control. If you start at any other time of your menstrual cycle, you will need to use another form of birth control for 7 days. If your OCP is the type called a minipill, it will protect you from pregnancy after taking it for 2 days (48  hours). After you have started taking OCPs:   If you forget to take 1 pill, take it as soon as you remember. Take the next pill at the regular time.   If you miss 2 or more pills, call your health care provider because different pills have different instructions for missed doses. Use backup birth control until your next menstrual period starts.   If you use a 28-day pack that contains inactive pills and you miss 1 of the last 7 pills (pills with no hormones), it will not matter. Throw away the rest of the non-hormone pills and start a new pill pack.  No matter which day you start the OCP, you will always start a new pack on that same day of the week. Have an extra pack of OCPs and a backup contraceptive method available in case you miss some pills or lose your OCP pack.  HOME CARE INSTRUCTIONS   Do not smoke.   Always use a condom to protect against STDs. OCPs do not protect against STDs.   Use a calendar to mark your menstrual period days.   Read the information and directions that came with your OCP. Talk to your health care provider if you have questions.  SEEK MEDICAL CARE IF:   You develop nausea and vomiting.   You have abnormal vaginal discharge or bleeding.   You develop a rash.   You miss your menstrual period.   You are losing   your hair.   You need treatment for mood swings or depression.   You get dizzy when taking the OCP.   You develop acne from taking the OCP.   You become pregnant.  SEEK IMMEDIATE MEDICAL CARE IF:   You develop chest pain.   You develop shortness of breath.   You have an uncontrolled or severe headache.   You develop numbness or slurred speech.   You develop visual problems.   You develop pain, redness, and swelling in the legs.  Document Released: 04/28/2011 Document Revised: 09/23/2013 Document Reviewed: 10/28/2012 [employer] Patient Information 2015 [employer]. This information is not intended to replace  advice given to you by your health care provider. Make sure you discuss any questions you have with your health care provider.  

## 2014-11-19 LAB — TSH: TSH: 0.754 u[IU]/mL (ref 0.350–4.500)

## 2015-03-31 ENCOUNTER — Ambulatory Visit (INDEPENDENT_AMBULATORY_CARE_PROVIDER_SITE_OTHER): Payer: BC Managed Care – PPO | Admitting: Obstetrics & Gynecology

## 2015-03-31 ENCOUNTER — Encounter: Payer: Self-pay | Admitting: Obstetrics & Gynecology

## 2015-03-31 VITALS — BP 117/70 | HR 70 | Wt 143.0 lb

## 2015-03-31 DIAGNOSIS — Z3480 Encounter for supervision of other normal pregnancy, unspecified trimester: Secondary | ICD-10-CM | POA: Insufficient documentation

## 2015-03-31 DIAGNOSIS — Z3481 Encounter for supervision of other normal pregnancy, first trimester: Secondary | ICD-10-CM

## 2015-03-31 DIAGNOSIS — Z113 Encounter for screening for infections with a predominantly sexual mode of transmission: Secondary | ICD-10-CM | POA: Diagnosis not present

## 2015-03-31 DIAGNOSIS — O34219 Maternal care for unspecified type scar from previous cesarean delivery: Secondary | ICD-10-CM | POA: Insufficient documentation

## 2015-03-31 DIAGNOSIS — Z23 Encounter for immunization: Secondary | ICD-10-CM | POA: Diagnosis not present

## 2015-03-31 NOTE — Progress Notes (Signed)
Bedside ultrasound today shows [redacted]w[redacted]d fetus with heartbeat.

## 2015-03-31 NOTE — Progress Notes (Signed)
Subjective:    Danielle Sawyer is a MW G3P2001 [redacted]w[redacted]d being seen today for her first obstetrical visit.  Her obstetrical history is significant for neonatal death following a VBAC. She wants a RLTCS at 39 weeks.. Patient does intend to breast feed. Pregnancy history fully reviewed.  Patient reports no complaints.  Filed Vitals:   03/31/15 1530  BP: 117/70  Pulse: 70  Weight: 143 lb (64.864 kg)    HISTORY: OB History  Gravida Para Term Preterm AB SAB TAB Ectopic Multiple Living  3 2 2       0 1    # Outcome Date GA Lbr Len/2nd Weight Sex Delivery Anes PTL Lv  3 Current           2 Term 10/09/14 [redacted]w[redacted]d 16:44 / 00:38 6 lb 3.1 oz (2.81 kg) M VBAC EPI       Complications: Abruptio Placenta,Other Excessive Bleeding  1 Term 12/19/12 [redacted]w[redacted]d  6 lb 9.5 oz (2.99 kg) M CS-LTranv Spinal  Y    Obstetric Comments  Neonatal demise on Day 3 of life   Past Medical History  Diagnosis Date  . Medical history non-contributory    Past Surgical History  Procedure Laterality Date  . Wisdom tooth extraction      x3  . Cesarean section N/A 12/19/2012    Procedure: CESAREAN SECTION;  Surgeon: Woodroe Mode, MD;  Location: [facility];  Service: Obstetrics;  Laterality: N/A;  primary  . Dilation and evacuation N/A 11/06/2014    Procedure: DILATATION AND EVACUATION UNDER ULTRASOUND GUIDANCE;  Surgeon: Osborne Oman, MD;  Location: [facility];  Service: Gynecology;  Laterality: N/A;   Family History  Problem Relation Age of Onset  . Heart disease Father     quad bypass surgery  . Cancer Paternal Aunt     pancreatic  . Cancer Maternal Grandmother     breast  . Cancer Maternal Grandfather     lung  . Cancer Paternal Grandmother     breast  . Cancer Paternal Grandfather   . Cancer Paternal Aunt      Exam    Uterus:     Pelvic Exam:    Perineum: No Hemorrhoids   Vulva: normal   Vagina:  normal mucosa   pH:    Cervix: anteverted   Adnexa: normal adnexa   Bony Pelvis: android  System:  Breast:  normal appearance, no masses or tenderness   Skin: normal coloration and turgor, no rashes    Neurologic: oriented   Extremities: normal strength, tone, and muscle mass   HEENT PERRLA   Mouth/Teeth mucous membranes moist, pharynx normal without lesions   Neck supple   Cardiovascular: regular rate and rhythm   Respiratory:  appears well, vitals normal, no respiratory distress, acyanotic, normal RR, ear and throat exam is normal, neck free of mass or lymphadenopathy, chest clear, no wheezing, crepitations, rhonchi, normal symmetric air entry   Abdomen: soft, non-tender; bowel sounds normal; no masses,  no organomegaly   Urinary: urethral meatus normal      Assessment:    Pregnancy: G3P2001 Patient Active Problem List   Diagnosis Date Noted  . Supervision of other normal pregnancy, antepartum 03/31/2015  . Previous cesarean delivery, delivered 03/31/2015  . Pregnancy with early neonatal death 10-17-14        Plan:     Initial labs drawn. Prenatal vitamins. Problem list reviewed and updated. Genetic Screening discussed : She declines genetic screening.   Ultrasound discussed; fetal  survey: requested.  Follow up in 5 weeks. Flu vaccine today [employer] per her request Danielle Sawyer C. 03/31/2015

## 2015-04-01 LAB — PRENATAL PROFILE (SOLSTAS)
Antibody Screen: NEGATIVE
BASOS PCT: 0 % (ref 0–1)
Basophils Absolute: 0 10*3/uL (ref 0.0–0.1)
Eosinophils Absolute: 0.2 10*3/uL (ref 0.0–0.7)
Eosinophils Relative: 2 % (ref 0–5)
HCT: 38.7 % (ref 36.0–46.0)
HEP B S AG: NEGATIVE
HIV: NONREACTIVE
Hemoglobin: 12.7 g/dL (ref 12.0–15.0)
LYMPHS PCT: 25 % (ref 12–46)
Lymphs Abs: 2.2 10*3/uL (ref 0.7–4.0)
MCH: 30.5 pg (ref 26.0–34.0)
MCHC: 32.8 g/dL (ref 30.0–36.0)
MCV: 93 fL (ref 78.0–100.0)
MONOS PCT: 7 % (ref 3–12)
MPV: 9.3 fL (ref 8.6–12.4)
Monocytes Absolute: 0.6 10*3/uL (ref 0.1–1.0)
NEUTROS ABS: 5.7 10*3/uL (ref 1.7–7.7)
Neutrophils Relative %: 66 % (ref 43–77)
PLATELETS: 294 10*3/uL (ref 150–400)
RBC: 4.16 MIL/uL (ref 3.87–5.11)
RDW: 12.8 % (ref 11.5–15.5)
RUBELLA: 1.35 {index} — AB (ref <0.90)
Rh Type: POSITIVE
WBC: 8.7 10*3/uL (ref 4.0–10.5)

## 2015-04-01 LAB — CULTURE, OB URINE
Colony Count: NO GROWTH
ORGANISM ID, BACTERIA: NO GROWTH

## 2015-04-02 LAB — URINE CYTOLOGY ANCILLARY ONLY
Chlamydia: NEGATIVE
Neisseria Gonorrhea: NEGATIVE

## 2015-05-04 ENCOUNTER — Ambulatory Visit (INDEPENDENT_AMBULATORY_CARE_PROVIDER_SITE_OTHER): Payer: BC Managed Care – PPO | Admitting: Family Medicine

## 2015-05-04 ENCOUNTER — Encounter: Payer: Self-pay | Admitting: Family Medicine

## 2015-05-04 VITALS — BP 111/70 | HR 66 | Wt 145.0 lb

## 2015-05-04 DIAGNOSIS — O34219 Maternal care for unspecified type scar from previous cesarean delivery: Secondary | ICD-10-CM

## 2015-05-04 DIAGNOSIS — Z3481 Encounter for supervision of other normal pregnancy, first trimester: Secondary | ICD-10-CM

## 2015-05-04 NOTE — Patient Instructions (Signed)
Second Trimester of Pregnancy The second trimester is from week 13 through week 28, months 4 through 6. The second trimester is often a time when you feel your best. Your body has also adjusted to being pregnant, and you begin to feel better physically. Usually, morning sickness has lessened or quit completely, you may have more energy, and you may have an increase in appetite. The second trimester is also a time when the fetus is growing rapidly. At the end of the sixth month, the fetus is about 9 inches long and weighs about 1 pounds. You will likely begin to feel the baby move (quickening) between 18 and 20 weeks of the pregnancy. BODY CHANGES Your body goes through many changes during pregnancy. The changes vary from woman to woman.   Your weight will continue to increase. You will notice your lower abdomen bulging out.  You may begin to get stretch marks on your hips, abdomen, and breasts.  You may develop headaches that can be relieved by medicines approved by your health care provider.  You may urinate more often because the fetus is pressing on your bladder.  You may develop or continue to have heartburn as a result of your pregnancy.  You may develop constipation because certain hormones are causing the muscles that push waste through your intestines to slow down.  You may develop hemorrhoids or swollen, bulging veins (varicose veins).  You may have back pain because of the weight gain and pregnancy hormones relaxing your joints between the bones in your pelvis and as a result of a shift in weight and the muscles that support your balance.  Your breasts will continue to grow and be tender.  Your gums may bleed and may be sensitive to brushing and flossing.  Dark spots or blotches (chloasma, mask of pregnancy) may develop on your face. This will likely fade after the baby is born.  A dark line from your belly button to the pubic area (linea nigra) may appear. This will likely  fade after the baby is born.  You may have changes in your hair. These can include thickening of your hair, rapid growth, and changes in texture. Some women also have hair loss during or after pregnancy, or hair that feels dry or thin. Your hair will most likely return to normal after your baby is born. WHAT TO EXPECT AT YOUR PRENATAL VISITS During a routine prenatal visit:  You will be weighed to make sure you and the fetus are growing normally.  Your blood pressure will be taken.  Your abdomen will be measured to track your baby's growth.  The fetal heartbeat will be listened to.  Any test results from the previous visit will be discussed. Your health care provider may ask you:  How you are feeling.  If you are feeling the baby move.  If you have had any abnormal symptoms, such as leaking fluid, bleeding, severe headaches, or abdominal cramping.  If you are using any tobacco products, including cigarettes, chewing tobacco, and electronic cigarettes.  If you have any questions. Other tests that may be performed during your second trimester include:  Blood tests that check for:  Low iron levels (anemia).  Gestational diabetes (between 24 and 28 weeks).  Rh antibodies.  Urine tests to check for infections, diabetes, or protein in the urine.  An ultrasound to confirm the proper growth and development of the baby.  An amniocentesis to check for possible genetic problems.  Fetal screens for spina bifida   and Down syndrome.  HIV (human immunodeficiency virus) testing. Routine prenatal testing includes screening for HIV, unless you choose not to have this test. HOME CARE INSTRUCTIONS   Avoid all smoking, herbs, alcohol, and unprescribed drugs. These chemicals affect the formation and growth of the baby.  Do not use any tobacco products, including cigarettes, chewing tobacco, and electronic cigarettes. If you need help quitting, ask your health care provider. You may receive  counseling support and other resources to help you quit.  Follow your health care provider's instructions regarding medicine use. There are medicines that are either safe or unsafe to take during pregnancy.  Exercise only as directed by your health care provider. Experiencing uterine cramps is a good sign to stop exercising.  Continue to eat regular, healthy meals.  Wear a good support bra for breast tenderness.  Do not use hot tubs, steam rooms, or saunas.  Wear your seat belt at all times when driving.  Avoid raw meat, uncooked cheese, cat litter boxes, and soil used by cats. These carry germs that can cause birth defects in the baby.  Take your prenatal vitamins.  Take 1500-2000 mg of calcium daily starting at the 20th week of pregnancy until you deliver your baby.  Try taking a stool softener (if your health care provider approves) if you develop constipation. Eat more high-fiber foods, such as fresh vegetables or fruit and whole grains. Drink plenty of fluids to keep your urine clear or pale yellow.  Take warm sitz baths to soothe any pain or discomfort caused by hemorrhoids. Use hemorrhoid cream if your health care provider approves.  If you develop varicose veins, wear support hose. Elevate your feet for 15 minutes, 3-4 times a day. Limit salt in your diet.  Avoid heavy lifting, wear low heel shoes, and practice good posture.  Rest with your legs elevated if you have leg cramps or low back pain.  Visit your dentist if you have not gone yet during your pregnancy. Use a soft toothbrush to brush your teeth and be gentle when you floss.  A sexual relationship may be continued unless your health care provider directs you otherwise.  Continue to go to all your prenatal visits as directed by your health care provider. SEEK MEDICAL CARE IF:   You have dizziness.  You have mild pelvic cramps, pelvic pressure, or nagging pain in the abdominal area.  You have persistent nausea,  vomiting, or diarrhea.  You have a bad smelling vaginal discharge.  You have pain with urination. SEEK IMMEDIATE MEDICAL CARE IF:   You have a fever.  You are leaking fluid from your vagina.  You have spotting or bleeding from your vagina.  You have severe abdominal cramping or pain.  You have rapid weight gain or loss.  You have shortness of breath with chest pain.  You notice sudden or extreme swelling of your face, hands, ankles, feet, or legs.  You have not felt your baby move in over an hour.  You have severe headaches that do not go away with medicine.  You have vision changes.   This information is not intended to replace advice given to you by your health care provider. Make sure you discuss any questions you have with your health care provider.   Document Released: 05/03/2001 Document Revised: 05/30/2014 Document Reviewed: 07/10/2012 Elsevier Interactive Patient Education 2016 [employer]   Breastfeeding Deciding to breastfeed is one of the best choices you can make for you and your baby. A change   in hormones during pregnancy causes your breast tissue to grow and increases the number and size of your milk ducts. These hormones also allow proteins, sugars, and fats from your blood supply to make breast milk in your milk-producing glands. Hormones prevent breast milk from being released before your baby is born as well as prompt milk flow after birth. Once breastfeeding has begun, thoughts of your baby, as well as his or her sucking or crying, can stimulate the release of milk from your milk-producing glands.  BENEFITS OF BREASTFEEDING For Your Baby  Your first milk (colostrum) helps your baby's digestive system function better.  There are antibodies in your milk that help your baby fight off infections.  Your baby has a lower incidence of asthma, allergies, and sudden infant death syndrome.  The nutrients in breast milk are better for your baby than infant  formulas and are designed uniquely for your baby's needs.  Breast milk improves your baby's brain development.  Your baby is less likely to develop other conditions, such as childhood obesity, asthma, or type 2 diabetes mellitus. For You  Breastfeeding helps to create a very special bond between you and your baby.  Breastfeeding is convenient. Breast milk is always available at the correct temperature and costs nothing.  Breastfeeding helps to burn calories and helps you lose the weight gained during pregnancy.  Breastfeeding makes your uterus contract to its prepregnancy size faster and slows bleeding (lochia) after you give birth.   Breastfeeding helps to lower your risk of developing type 2 diabetes mellitus, osteoporosis, and breast or ovarian cancer later in life. SIGNS THAT YOUR BABY IS HUNGRY Early Signs of Hunger  Increased alertness or activity.  Stretching.  Movement of the head from side to side.  Movement of the head and opening of the mouth when the corner of the mouth or cheek is stroked (rooting).  Increased sucking sounds, smacking lips, cooing, sighing, or squeaking.  Hand-to-mouth movements.  Increased sucking of fingers or hands. Late Signs of Hunger  Fussing.  Intermittent crying. Extreme Signs of Hunger Signs of extreme hunger will require calming and consoling before your baby will be able to breastfeed successfully. Do not wait for the following signs of extreme hunger to occur before you initiate breastfeeding:  Restlessness.  A loud, strong cry.  Screaming. BREASTFEEDING BASICS Breastfeeding Initiation  Find a comfortable place to sit or lie down, with your neck and back well supported.  Place a pillow or rolled up blanket under your baby to bring him or her to the level of your breast (if you are seated). Nursing pillows are specially designed to help support your arms and your baby while you breastfeed.  Make sure that your baby's  abdomen is facing your abdomen.  Gently massage your breast. With your fingertips, massage from your chest wall toward your nipple in a circular motion. This encourages milk flow. You may need to continue this action during the feeding if your milk flows slowly.  Support your breast with 4 fingers underneath and your thumb above your nipple. Make sure your fingers are well away from your nipple and your baby's mouth.  Stroke your baby's lips gently with your finger or nipple.  When your baby's mouth is open wide enough, quickly bring your baby to your breast, placing your entire nipple and as much of the colored area around your nipple (areola) as possible into your baby's mouth.  More areola should be visible above your baby's upper lip than   below the lower lip.  Your baby's tongue should be between his or her lower gum and your breast.  Ensure that your baby's mouth is correctly positioned around your nipple (latched). Your baby's lips should create a seal on your breast and be turned out (everted).  It is common for your baby to suck about 2-3 minutes in order to start the flow of breast milk. Latching Teaching your baby how to latch on to your breast properly is very important. An improper latch can cause nipple pain and decreased milk supply for you and poor weight gain in your baby. Also, if your baby is not latched onto your nipple properly, he or she may swallow some air during feeding. This can make your baby fussy. Burping your baby when you switch breasts during the feeding can help to get rid of the air. However, teaching your baby to latch on properly is still the best way to prevent fussiness from swallowing air while breastfeeding. Signs that your baby has successfully latched on to your nipple:  Silent tugging or silent sucking, without causing you pain.  Swallowing heard between every 3-4 sucks.  Muscle movement above and in front of his or her ears while sucking. Signs  that your baby has not successfully latched on to nipple:  Sucking sounds or smacking sounds from your baby while breastfeeding.  Nipple pain. If you think your baby has not latched on correctly, slip your finger into the corner of your baby's mouth to break the suction and place it between your baby's gums. Attempt breastfeeding initiation again. Signs of Successful Breastfeeding Signs from your baby:  A gradual decrease in the number of sucks or complete cessation of sucking.  Falling asleep.  Relaxation of his or her body.  Retention of a small amount of milk in his or her mouth.  Letting go of your breast by himself or herself. Signs from you:  Breasts that have increased in firmness, weight, and size 1-3 hours after feeding.  Breasts that are softer immediately after breastfeeding.  Increased milk volume, as well as a change in milk consistency and color by the fifth day of breastfeeding.  Nipples that are not sore, cracked, or bleeding. Signs That Your Baby is Getting Enough Milk  Wetting at least 3 diapers in a 24-hour period. The urine should be clear and pale yellow by age 5 days.  At least 3 stools in a 24-hour period by age 5 days. The stool should be soft and yellow.  At least 3 stools in a 24-hour period by age 7 days. The stool should be seedy and yellow.  No loss of weight greater than 10% of birth weight during the first 3 days of age.  Average weight gain of 4-7 ounces (113-198 g) per week after age 4 days.  Consistent daily weight gain by age 5 days, without weight loss after the age of 2 weeks. After a feeding, your baby may spit up a small amount. This is common. BREASTFEEDING FREQUENCY AND DURATION Frequent feeding will help you make more milk and can prevent sore nipples and breast engorgement. Breastfeed when you feel the need to reduce the fullness of your breasts or when your baby shows signs of hunger. This is called "breastfeeding on demand." Avoid  introducing a pacifier to your baby while you are working to establish breastfeeding (the first 4-6 weeks after your baby is born). After this time you may choose to use a pacifier. Research has shown that   pacifier use during the first year of a baby's life decreases the risk of sudden infant death syndrome (SIDS). Allow your baby to feed on each breast as long as he or she wants. Breastfeed until your baby is finished feeding. When your baby unlatches or falls asleep while feeding from the first breast, offer the second breast. Because newborns are often sleepy in the first few weeks of life, you may need to awaken your baby to get him or her to feed. Breastfeeding times will vary from baby to baby. However, the following rules can serve as a guide to help you ensure that your baby is properly fed:  Newborns (babies 4 weeks of age or younger) may breastfeed every 1-3 hours.  Newborns should not go longer than 3 hours during the day or 5 hours during the night without breastfeeding.  You should breastfeed your baby a minimum of 8 times in a 24-hour period until you begin to introduce solid foods to your baby at around 6 months of age. BREAST MILK PUMPING Pumping and storing breast milk allows you to ensure that your baby is exclusively fed your breast milk, even at times when you are unable to breastfeed. This is especially important if you are going back to work while you are still breastfeeding or when you are not able to be present during feedings. Your lactation consultant can give you guidelines on how long it is safe to store breast milk. A breast pump is a machine that allows you to pump milk from your breast into a sterile bottle. The pumped breast milk can then be stored in a refrigerator or freezer. Some breast pumps are operated by hand, while others use electricity. Ask your lactation consultant which type will work best for you. Breast pumps can be purchased, but some hospitals and  breastfeeding support groups lease breast pumps on a monthly basis. A lactation consultant can teach you how to hand express breast milk, if you prefer not to use a pump. CARING FOR YOUR BREASTS WHILE YOU BREASTFEED Nipples can become dry, cracked, and sore while breastfeeding. The following recommendations can help keep your breasts moisturized and healthy:  Avoid using soap on your nipples.  Wear a supportive bra. Although not required, special nursing bras and tank tops are designed to allow access to your breasts for breastfeeding without taking off your entire bra or top. Avoid wearing underwire-style bras or extremely tight bras.  Air dry your nipples for 3-4minutes after each feeding.  Use only cotton bra pads to absorb leaked breast milk. Leaking of breast milk between feedings is normal.  Use lanolin on your nipples after breastfeeding. Lanolin helps to maintain your skin's normal moisture barrier. If you use pure lanolin, you do not need to wash it off before feeding your baby again. Pure lanolin is not toxic to your baby. You may also hand express a few drops of breast milk and gently massage that milk into your nipples and allow the milk to air dry. In the first few weeks after giving birth, some women experience extremely full breasts (engorgement). Engorgement can make your breasts feel heavy, warm, and tender to the touch. Engorgement peaks within 3-5 days after you give birth. The following recommendations can help ease engorgement:  Completely empty your breasts while breastfeeding or pumping. You may want to start by applying warm, moist heat (in the shower or with warm water-soaked hand towels) just before feeding or pumping. This increases circulation and helps the milk   flow. If your baby does not completely empty your breasts while breastfeeding, pump any extra milk after he or she is finished.  Wear a snug bra (nursing or regular) or tank top for 1-2 days to signal your body  to slightly decrease milk production.  Apply ice packs to your breasts, unless this is too uncomfortable for you.  Make sure that your baby is latched on and positioned properly while breastfeeding. If engorgement persists after 48 hours of following these recommendations, contact your health care provider or a lactation consultant. OVERALL HEALTH CARE RECOMMENDATIONS WHILE BREASTFEEDING  Eat healthy foods. Alternate between meals and snacks, eating 3 of each per day. Because what you eat affects your breast milk, some of the foods may make your baby more irritable than usual. Avoid eating these foods if you are sure that they are negatively affecting your baby.  Drink milk, fruit juice, and water to satisfy your thirst (about 10 glasses a day).  Rest often, relax, and continue to take your prenatal vitamins to prevent fatigue, stress, and anemia.  Continue breast self-awareness checks.  Avoid chewing and smoking tobacco. Chemicals from cigarettes that pass into breast milk and exposure to secondhand smoke may harm your baby.  Avoid alcohol and drug use, including marijuana. Some medicines that may be harmful to your baby can pass through breast milk. It is important to ask your health care provider before taking any medicine, including all over-the-counter and prescription medicine as well as vitamin and herbal supplements. It is possible to become pregnant while breastfeeding. If birth control is desired, ask your health care provider about options that will be safe for your baby. SEEK MEDICAL CARE IF:  You feel like you want to stop breastfeeding or have become frustrated with breastfeeding.  You have painful breasts or nipples.  Your nipples are cracked or bleeding.  Your breasts are red, tender, or warm.  You have a swollen area on either breast.  You have a fever or chills.  You have nausea or vomiting.  You have drainage other than breast milk from your nipples.  Your  breasts do not become full before feedings by the fifth day after you give birth.  You feel sad and depressed.  Your baby is too sleepy to eat well.  Your baby is having trouble sleeping.   Your baby is wetting less than 3 diapers in a 24-hour period.  Your baby has less than 3 stools in a 24-hour period.  Your baby's skin or the white part of his or her eyes becomes yellow.   Your baby is not gaining weight by 5 days of age. SEEK IMMEDIATE MEDICAL CARE IF:  Your baby is overly tired (lethargic) and does not want to wake up and feed.  Your baby develops an unexplained fever.   This information is not intended to replace advice given to you by your health care provider. Make sure you discuss any questions you have with your health care provider.   Document Released: 05/09/2005 Document Revised: 01/28/2015 Document Reviewed: 10/31/2012 Elsevier Interactive Patient Education 2016 [employer]  

## 2015-05-04 NOTE — Progress Notes (Signed)
Subjective:  Danielle Sawyer is a 34 y.o. G3P2001 at [redacted]w[redacted]d being seen today for ongoing prenatal care.  She is currently monitored for the following issues for this low-risk pregnancy and has Pregnancy with early neonatal death; Supervision of other normal pregnancy, antepartum; and Previous cesarean delivery, delivered on her problem list.  Patient reports no complaints.  Contractions: Not present. Vag. Bleeding: None.  Movement: Absent. Denies leaking of fluid.   The following portions of the patient's history were reviewed and updated as appropriate: allergies, current medications, past family history, past medical history, past social history, past surgical history and problem list. Problem list updated.  Objective:   Filed Vitals:   05/04/15 1600  BP: 111/70  Pulse: 66  Weight: 145 lb (65.772 kg)    Fetal Status: Fetal Heart Rate (bpm): + on [place]    Movement: Absent     General:  Alert, oriented and cooperative. Patient is in no acute distress.  Skin: Skin is warm and dry. No rash noted.   Cardiovascular: Normal heart rate noted  Respiratory: Normal respiratory effort, no problems with respiration noted  Abdomen: Soft, gravid, appropriate for gestational age. Pain/Pressure: Absent     Pelvic: Vag. Bleeding: None Vag D/C Character: Thin   Cervical exam deferred        Extremities: Normal range of motion.  Edema: None  Mental Status: Normal mood and affect. Normal behavior. Normal judgment and thought content.   Urinalysis: Urine Protein: Negative Urine Glucose: Negative  Assessment and Plan:  Pregnancy: G3P2001 at [redacted]w[redacted]d  1. Supervision of other normal pregnancy, antepartum, first trimester Continue routine prenatal care. Declines Baby Rx for now  2. Previous cesarean delivery, delivered For Elective Repeat.  Please refer to After Visit Summary for other counseling recommendations.  Return in 6 weeks (on 06/15/2015).   Donnamae Jude, MD

## 2015-06-05 ENCOUNTER — Ambulatory Visit (INDEPENDENT_AMBULATORY_CARE_PROVIDER_SITE_OTHER): Payer: BC Managed Care – PPO | Admitting: Family Medicine

## 2015-06-05 VITALS — BP 104/63 | HR 60 | Wt 148.0 lb

## 2015-06-05 DIAGNOSIS — Z3482 Encounter for supervision of other normal pregnancy, second trimester: Secondary | ICD-10-CM

## 2015-06-05 DIAGNOSIS — O34219 Maternal care for unspecified type scar from previous cesarean delivery: Secondary | ICD-10-CM

## 2015-06-05 NOTE — Progress Notes (Signed)
Subjective:  Danielle Sawyer is a 35 y.o. G3P2001 at [redacted]w[redacted]d being seen today for ongoing prenatal care.  She is currently monitored for the following issues for this low-risk pregnancy and has Pregnancy with early neonatal death; Supervision of other normal pregnancy, antepartum; and Previous cesarean delivery, delivered on her problem list.  Patient reports bleeding.  Contractions: Not present. Vag. Bleeding: None.  Movement: Absent. Denies leaking of fluid.   The following portions of the patient's history were reviewed and updated as appropriate: allergies, current medications, past family history, past medical history, past social history, past surgical history and problem list. Problem list updated.  Objective:   Filed Vitals:   06/05/15 0900  BP: 104/63  Pulse: 60  Weight: 148 lb (67.132 kg)    Fetal Status: Fetal Heart Rate (bpm): 135   Movement: Absent     General:  Alert, oriented and cooperative. Patient is in no acute distress.  Skin: Skin is warm and dry. No rash noted.   Cardiovascular: Normal heart rate noted  Respiratory: Normal respiratory effort, no problems with respiration noted  Abdomen: Soft, gravid, appropriate for gestational age. Pain/Pressure: Absent     Pelvic: Vag. Bleeding: None Vag D/C Character: Thin   Cervical exam performed      No blood in vault, Cervix is closed/long/high with excellent tone  Extremities: Normal range of motion.  Edema: None  Mental Status: Normal mood and affect. Normal behavior. Normal judgment and thought content.   Urinalysis: Urine Protein: Negative Urine Glucose: Negative  Assessment and Plan:  Pregnancy: G3P2001 at [redacted]w[redacted]d  1. Supervision of other normal pregnancy, antepartum, second trimester Continue prenatal care. ?Subchorionic hemorrhage--no evidence of cervical issues. Bleeding is not related to intercourse. Check u/s with anatomy  - [place] MFM OB COMP + 14 WK; Future  2. Previous cesarean delivery, delivered For  Elective repeat  General obstetric precautions including but not limited to vaginal bleeding, contractions, leaking of fluid and fetal movement were reviewed in detail with the patient. Please refer to After Visit Summary for other counseling recommendations.  Return in 4 weeks (on 07/03/2015).   Donnamae Jude, MD

## 2015-06-05 NOTE — Patient Instructions (Signed)
Second Trimester of Pregnancy The second trimester is from week 13 through week 28, months 4 through 6. The second trimester is often a time when you feel your best. Your body has also adjusted to being pregnant, and you begin to feel better physically. Usually, morning sickness has lessened or quit completely, you may have more energy, and you may have an increase in appetite. The second trimester is also a time when the fetus is growing rapidly. At the end of the sixth month, the fetus is about 9 inches long and weighs about 1 pounds. You will likely begin to feel the baby move (quickening) between 18 and 20 weeks of the pregnancy. BODY CHANGES Your body goes through many changes during pregnancy. The changes vary from woman to woman.   Your weight will continue to increase. You will notice your lower abdomen bulging out.  You may begin to get stretch marks on your hips, abdomen, and breasts.  You may develop headaches that can be relieved by medicines approved by your health care provider.  You may urinate more often because the fetus is pressing on your bladder.  You may develop or continue to have heartburn as a result of your pregnancy.  You may develop constipation because certain hormones are causing the muscles that push waste through your intestines to slow down.  You may develop hemorrhoids or swollen, bulging veins (varicose veins).  You may have back pain because of the weight gain and pregnancy hormones relaxing your joints between the bones in your pelvis and as a result of a shift in weight and the muscles that support your balance.  Your breasts will continue to grow and be tender.  Your gums may bleed and may be sensitive to brushing and flossing.  Dark spots or blotches (chloasma, mask of pregnancy) may develop on your face. This will likely fade after the baby is born.  A dark line from your belly button to the pubic area (linea nigra) may appear. This will likely  fade after the baby is born.  You may have changes in your hair. These can include thickening of your hair, rapid growth, and changes in texture. Some women also have hair loss during or after pregnancy, or hair that feels dry or thin. Your hair will most likely return to normal after your baby is born. WHAT TO EXPECT AT YOUR PRENATAL VISITS During a routine prenatal visit:  You will be weighed to make sure you and the fetus are growing normally.  Your blood pressure will be taken.  Your abdomen will be measured to track your baby's growth.  The fetal heartbeat will be listened to.  Any test results from the previous visit will be discussed. Your health care provider may ask you:  How you are feeling.  If you are feeling the baby move.  If you have had any abnormal symptoms, such as leaking fluid, bleeding, severe headaches, or abdominal cramping.  If you are using any tobacco products, including cigarettes, chewing tobacco, and electronic cigarettes.  If you have any questions. Other tests that may be performed during your second trimester include:  Blood tests that check for:  Low iron levels (anemia).  Gestational diabetes (between 24 and 28 weeks).  Rh antibodies.  Urine tests to check for infections, diabetes, or protein in the urine.  An ultrasound to confirm the proper growth and development of the baby.  An amniocentesis to check for possible genetic problems.  Fetal screens for spina bifida   and Down syndrome.  HIV (human immunodeficiency virus) testing. Routine prenatal testing includes screening for HIV, unless you choose not to have this test. HOME CARE INSTRUCTIONS   Avoid all smoking, herbs, alcohol, and unprescribed drugs. These chemicals affect the formation and growth of the baby.  Do not use any tobacco products, including cigarettes, chewing tobacco, and electronic cigarettes. If you need help quitting, ask your health care provider. You may receive  counseling support and other resources to help you quit.  Follow your health care provider's instructions regarding medicine use. There are medicines that are either safe or unsafe to take during pregnancy.  Exercise only as directed by your health care provider. Experiencing uterine cramps is a good sign to stop exercising.  Continue to eat regular, healthy meals.  Wear a good support bra for breast tenderness.  Do not use hot tubs, steam rooms, or saunas.  Wear your seat belt at all times when driving.  Avoid raw meat, uncooked cheese, cat litter boxes, and soil used by cats. These carry germs that can cause birth defects in the baby.  Take your prenatal vitamins.  Take 1500-2000 mg of calcium daily starting at the 20th week of pregnancy until you deliver your baby.  Try taking a stool softener (if your health care provider approves) if you develop constipation. Eat more high-fiber foods, such as fresh vegetables or fruit and whole grains. Drink plenty of fluids to keep your urine clear or pale yellow.  Take warm sitz baths to soothe any pain or discomfort caused by hemorrhoids. Use hemorrhoid cream if your health care provider approves.  If you develop varicose veins, wear support hose. Elevate your feet for 15 minutes, 3-4 times a day. Limit salt in your diet.  Avoid heavy lifting, wear low heel shoes, and practice good posture.  Rest with your legs elevated if you have leg cramps or low back pain.  Visit your dentist if you have not gone yet during your pregnancy. Use a soft toothbrush to brush your teeth and be gentle when you floss.  A sexual relationship may be continued unless your health care provider directs you otherwise.  Continue to go to all your prenatal visits as directed by your health care provider. SEEK MEDICAL CARE IF:   You have dizziness.  You have mild pelvic cramps, pelvic pressure, or nagging pain in the abdominal area.  You have persistent nausea,  vomiting, or diarrhea.  You have a bad smelling vaginal discharge.  You have pain with urination. SEEK IMMEDIATE MEDICAL CARE IF:   You have a fever.  You are leaking fluid from your vagina.  You have spotting or bleeding from your vagina.  You have severe abdominal cramping or pain.  You have rapid weight gain or loss.  You have shortness of breath with chest pain.  You notice sudden or extreme swelling of your face, hands, ankles, feet, or legs.  You have not felt your baby move in over an hour.  You have severe headaches that do not go away with medicine.  You have vision changes.   This information is not intended to replace advice given to you by your health care provider. Make sure you discuss any questions you have with your health care provider.   Document Released: 05/03/2001 Document Revised: 05/30/2014 Document Reviewed: 07/10/2012 Elsevier Interactive Patient Education 2016 [employer]   Breastfeeding Deciding to breastfeed is one of the best choices you can make for you and your baby. A change   in hormones during pregnancy causes your breast tissue to grow and increases the number and size of your milk ducts. These hormones also allow proteins, sugars, and fats from your blood supply to make breast milk in your milk-producing glands. Hormones prevent breast milk from being released before your baby is born as well as prompt milk flow after birth. Once breastfeeding has begun, thoughts of your baby, as well as his or her sucking or crying, can stimulate the release of milk from your milk-producing glands.  BENEFITS OF BREASTFEEDING For Your Baby  Your first milk (colostrum) helps your baby's digestive system function better.  There are antibodies in your milk that help your baby fight off infections.  Your baby has a lower incidence of asthma, allergies, and sudden infant death syndrome.  The nutrients in breast milk are better for your baby than infant  formulas and are designed uniquely for your baby's needs.  Breast milk improves your baby's brain development.  Your baby is less likely to develop other conditions, such as childhood obesity, asthma, or type 2 diabetes mellitus. For You  Breastfeeding helps to create a very special bond between you and your baby.  Breastfeeding is convenient. Breast milk is always available at the correct temperature and costs nothing.  Breastfeeding helps to burn calories and helps you lose the weight gained during pregnancy.  Breastfeeding makes your uterus contract to its prepregnancy size faster and slows bleeding (lochia) after you give birth.   Breastfeeding helps to lower your risk of developing type 2 diabetes mellitus, osteoporosis, and breast or ovarian cancer later in life. SIGNS THAT YOUR BABY IS HUNGRY Early Signs of Hunger  Increased alertness or activity.  Stretching.  Movement of the head from side to side.  Movement of the head and opening of the mouth when the corner of the mouth or cheek is stroked (rooting).  Increased sucking sounds, smacking lips, cooing, sighing, or squeaking.  Hand-to-mouth movements.  Increased sucking of fingers or hands. Late Signs of Hunger  Fussing.  Intermittent crying. Extreme Signs of Hunger Signs of extreme hunger will require calming and consoling before your baby will be able to breastfeed successfully. Do not wait for the following signs of extreme hunger to occur before you initiate breastfeeding:  Restlessness.  A loud, strong cry.  Screaming. BREASTFEEDING BASICS Breastfeeding Initiation  Find a comfortable place to sit or lie down, with your neck and back well supported.  Place a pillow or rolled up blanket under your baby to bring him or her to the level of your breast (if you are seated). Nursing pillows are specially designed to help support your arms and your baby while you breastfeed.  Make sure that your baby's  abdomen is facing your abdomen.  Gently massage your breast. With your fingertips, massage from your chest wall toward your nipple in a circular motion. This encourages milk flow. You may need to continue this action during the feeding if your milk flows slowly.  Support your breast with 4 fingers underneath and your thumb above your nipple. Make sure your fingers are well away from your nipple and your baby's mouth.  Stroke your baby's lips gently with your finger or nipple.  When your baby's mouth is open wide enough, quickly bring your baby to your breast, placing your entire nipple and as much of the colored area around your nipple (areola) as possible into your baby's mouth.  More areola should be visible above your baby's upper lip than   below the lower lip.  Your baby's tongue should be between his or her lower gum and your breast.  Ensure that your baby's mouth is correctly positioned around your nipple (latched). Your baby's lips should create a seal on your breast and be turned out (everted).  It is common for your baby to suck about 2-3 minutes in order to start the flow of breast milk. Latching Teaching your baby how to latch on to your breast properly is very important. An improper latch can cause nipple pain and decreased milk supply for you and poor weight gain in your baby. Also, if your baby is not latched onto your nipple properly, he or she may swallow some air during feeding. This can make your baby fussy. Burping your baby when you switch breasts during the feeding can help to get rid of the air. However, teaching your baby to latch on properly is still the best way to prevent fussiness from swallowing air while breastfeeding. Signs that your baby has successfully latched on to your nipple:  Silent tugging or silent sucking, without causing you pain.  Swallowing heard between every 3-4 sucks.  Muscle movement above and in front of his or her ears while sucking. Signs  that your baby has not successfully latched on to nipple:  Sucking sounds or smacking sounds from your baby while breastfeeding.  Nipple pain. If you think your baby has not latched on correctly, slip your finger into the corner of your baby's mouth to break the suction and place it between your baby's gums. Attempt breastfeeding initiation again. Signs of Successful Breastfeeding Signs from your baby:  A gradual decrease in the number of sucks or complete cessation of sucking.  Falling asleep.  Relaxation of his or her body.  Retention of a small amount of milk in his or her mouth.  Letting go of your breast by himself or herself. Signs from you:  Breasts that have increased in firmness, weight, and size 1-3 hours after feeding.  Breasts that are softer immediately after breastfeeding.  Increased milk volume, as well as a change in milk consistency and color by the fifth day of breastfeeding.  Nipples that are not sore, cracked, or bleeding. Signs That Your Baby is Getting Enough Milk  Wetting at least 3 diapers in a 24-hour period. The urine should be clear and pale yellow by age 5 days.  At least 3 stools in a 24-hour period by age 5 days. The stool should be soft and yellow.  At least 3 stools in a 24-hour period by age 7 days. The stool should be seedy and yellow.  No loss of weight greater than 10% of birth weight during the first 3 days of age.  Average weight gain of 4-7 ounces (113-198 g) per week after age 4 days.  Consistent daily weight gain by age 5 days, without weight loss after the age of 2 weeks. After a feeding, your baby may spit up a small amount. This is common. BREASTFEEDING FREQUENCY AND DURATION Frequent feeding will help you make more milk and can prevent sore nipples and breast engorgement. Breastfeed when you feel the need to reduce the fullness of your breasts or when your baby shows signs of hunger. This is called "breastfeeding on demand." Avoid  introducing a pacifier to your baby while you are working to establish breastfeeding (the first 4-6 weeks after your baby is born). After this time you may choose to use a pacifier. Research has shown that   pacifier use during the first year of a baby's life decreases the risk of sudden infant death syndrome (SIDS). Allow your baby to feed on each breast as long as he or she wants. Breastfeed until your baby is finished feeding. When your baby unlatches or falls asleep while feeding from the first breast, offer the second breast. Because newborns are often sleepy in the first few weeks of life, you may need to awaken your baby to get him or her to feed. Breastfeeding times will vary from baby to baby. However, the following rules can serve as a guide to help you ensure that your baby is properly fed:  Newborns (babies 4 weeks of age or younger) may breastfeed every 1-3 hours.  Newborns should not go longer than 3 hours during the day or 5 hours during the night without breastfeeding.  You should breastfeed your baby a minimum of 8 times in a 24-hour period until you begin to introduce solid foods to your baby at around 6 months of age. BREAST MILK PUMPING Pumping and storing breast milk allows you to ensure that your baby is exclusively fed your breast milk, even at times when you are unable to breastfeed. This is especially important if you are going back to work while you are still breastfeeding or when you are not able to be present during feedings. Your lactation consultant can give you guidelines on how long it is safe to store breast milk. A breast pump is a machine that allows you to pump milk from your breast into a sterile bottle. The pumped breast milk can then be stored in a refrigerator or freezer. Some breast pumps are operated by hand, while others use electricity. Ask your lactation consultant which type will work best for you. Breast pumps can be purchased, but some hospitals and  breastfeeding support groups lease breast pumps on a monthly basis. A lactation consultant can teach you how to hand express breast milk, if you prefer not to use a pump. CARING FOR YOUR BREASTS WHILE YOU BREASTFEED Nipples can become dry, cracked, and sore while breastfeeding. The following recommendations can help keep your breasts moisturized and healthy:  Avoid using soap on your nipples.  Wear a supportive bra. Although not required, special nursing bras and tank tops are designed to allow access to your breasts for breastfeeding without taking off your entire bra or top. Avoid wearing underwire-style bras or extremely tight bras.  Air dry your nipples for 3-4minutes after each feeding.  Use only cotton bra pads to absorb leaked breast milk. Leaking of breast milk between feedings is normal.  Use lanolin on your nipples after breastfeeding. Lanolin helps to maintain your skin's normal moisture barrier. If you use pure lanolin, you do not need to wash it off before feeding your baby again. Pure lanolin is not toxic to your baby. You may also hand express a few drops of breast milk and gently massage that milk into your nipples and allow the milk to air dry. In the first few weeks after giving birth, some women experience extremely full breasts (engorgement). Engorgement can make your breasts feel heavy, warm, and tender to the touch. Engorgement peaks within 3-5 days after you give birth. The following recommendations can help ease engorgement:  Completely empty your breasts while breastfeeding or pumping. You may want to start by applying warm, moist heat (in the shower or with warm water-soaked hand towels) just before feeding or pumping. This increases circulation and helps the milk   flow. If your baby does not completely empty your breasts while breastfeeding, pump any extra milk after he or she is finished.  Wear a snug bra (nursing or regular) or tank top for 1-2 days to signal your body  to slightly decrease milk production.  Apply ice packs to your breasts, unless this is too uncomfortable for you.  Make sure that your baby is latched on and positioned properly while breastfeeding. If engorgement persists after 48 hours of following these recommendations, contact your health care provider or a lactation consultant. OVERALL HEALTH CARE RECOMMENDATIONS WHILE BREASTFEEDING  Eat healthy foods. Alternate between meals and snacks, eating 3 of each per day. Because what you eat affects your breast milk, some of the foods may make your baby more irritable than usual. Avoid eating these foods if you are sure that they are negatively affecting your baby.  Drink milk, fruit juice, and water to satisfy your thirst (about 10 glasses a day).  Rest often, relax, and continue to take your prenatal vitamins to prevent fatigue, stress, and anemia.  Continue breast self-awareness checks.  Avoid chewing and smoking tobacco. Chemicals from cigarettes that pass into breast milk and exposure to secondhand smoke may harm your baby.  Avoid alcohol and drug use, including marijuana. Some medicines that may be harmful to your baby can pass through breast milk. It is important to ask your health care provider before taking any medicine, including all over-the-counter and prescription medicine as well as vitamin and herbal supplements. It is possible to become pregnant while breastfeeding. If birth control is desired, ask your health care provider about options that will be safe for your baby. SEEK MEDICAL CARE IF:  You feel like you want to stop breastfeeding or have become frustrated with breastfeeding.  You have painful breasts or nipples.  Your nipples are cracked or bleeding.  Your breasts are red, tender, or warm.  You have a swollen area on either breast.  You have a fever or chills.  You have nausea or vomiting.  You have drainage other than breast milk from your nipples.  Your  breasts do not become full before feedings by the fifth day after you give birth.  You feel sad and depressed.  Your baby is too sleepy to eat well.  Your baby is having trouble sleeping.   Your baby is wetting less than 3 diapers in a 24-hour period.  Your baby has less than 3 stools in a 24-hour period.  Your baby's skin or the white part of his or her eyes becomes yellow.   Your baby is not gaining weight by 5 days of age. SEEK IMMEDIATE MEDICAL CARE IF:  Your baby is overly tired (lethargic) and does not want to wake up and feed.  Your baby develops an unexplained fever.   This information is not intended to replace advice given to you by your health care provider. Make sure you discuss any questions you have with your health care provider.   Document Released: 05/09/2005 Document Revised: 01/28/2015 Document Reviewed: 10/31/2012 Elsevier Interactive Patient Education 2016 [employer]  

## 2015-06-15 ENCOUNTER — Encounter: Payer: BC Managed Care – PPO | Admitting: Family Medicine

## 2015-06-23 ENCOUNTER — Other Ambulatory Visit: Payer: Self-pay | Admitting: Family Medicine

## 2015-06-23 ENCOUNTER — Ambulatory Visit (HOSPITAL_COMMUNITY)
Admission: RE | Admit: 2015-06-23 | Discharge: 2015-06-23 | Disposition: A | Discharge status: Home / Self Care | Payer: BC Managed Care – PPO | Source: Ambulatory Visit | Attending: Family Medicine | Admitting: Family Medicine

## 2015-06-23 DIAGNOSIS — O34219 Maternal care for unspecified type scar from previous cesarean delivery: Secondary | ICD-10-CM

## 2015-06-23 DIAGNOSIS — Z36 Encounter for antenatal screening of mother: Secondary | ICD-10-CM | POA: Diagnosis not present

## 2015-06-23 DIAGNOSIS — Z3A19 19 weeks gestation of pregnancy: Secondary | ICD-10-CM | POA: Diagnosis not present

## 2015-06-23 DIAGNOSIS — O09522 Supervision of elderly multigravida, second trimester: Secondary | ICD-10-CM | POA: Diagnosis not present

## 2015-06-23 DIAGNOSIS — Z3482 Encounter for supervision of other normal pregnancy, second trimester: Secondary | ICD-10-CM

## 2015-06-30 ENCOUNTER — Ambulatory Visit (INDEPENDENT_AMBULATORY_CARE_PROVIDER_SITE_OTHER): Payer: BC Managed Care – PPO | Admitting: Obstetrics & Gynecology

## 2015-06-30 VITALS — BP 115/72 | HR 105 | Wt 150.0 lb

## 2015-06-30 DIAGNOSIS — Z3482 Encounter for supervision of other normal pregnancy, second trimester: Secondary | ICD-10-CM

## 2015-06-30 DIAGNOSIS — R509 Fever, unspecified: Secondary | ICD-10-CM | POA: Diagnosis not present

## 2015-06-30 LAB — POCT INFLUENZA A/B
INFLUENZA B, POC: NEGATIVE
Influenza A, POC: NEGATIVE

## 2015-06-30 MED ORDER — AZITHROMYCIN 250 MG PO TABS: ORAL_TABLET | ORAL | Status: DC

## 2015-06-30 NOTE — Progress Notes (Signed)
Pt c/o headache and upper respiratory and fever for the past 2 days.

## 2015-06-30 NOTE — Addendum Note (Signed)
Addended by: Ricka Burdock on: 06/30/2015 05:01 PM   Modules accepted: Orders

## 2015-06-30 NOTE — Addendum Note (Signed)
Addended by: Lin Landsman C on: 06/30/2015 04:26 PM   Modules accepted: Orders

## 2015-06-30 NOTE — Progress Notes (Signed)
Subjective:  Danielle Sawyer is a 35 y.o. G3P2001 at [redacted]w[redacted]d being seen today for ongoing prenatal care.  She is currently monitored for the following issues for this low-risk pregnancy and has Pregnancy with early neonatal death; Supervision of other normal pregnancy, antepartum; and Previous cesarean delivery, delivered on her problem list.  Patient reports Complains of a fever with sinus symptoms for 2 days. Her son was sick last week, needed an abx.  Contractions: Not present. Vag. Bleeding: None.  Movement: Present. Denies leaking of fluid.   The following portions of the patient's history were reviewed and updated as appropriate: allergies, current medications, past family history, past medical history, past social history, past surgical history and problem list. Problem list updated.  Objective:   Filed Vitals:   06/30/15 1603  BP: 115/72  Pulse: 105  Weight: 150 lb (68.04 kg)    Fetal Status: Fetal Heart Rate (bpm): 149   Movement: Present     General:  Alert, oriented and cooperative. Patient is in no acute distress.  Skin: Skin is warm and dry. No rash noted.   Cardiovascular: Normal heart rate noted  Respiratory: Normal respiratory effort, no problems with respiration noted  Abdomen: Soft, gravid, appropriate for gestational age. Pain/Pressure: Absent     Pelvic: Vag. Bleeding: None Vag D/C Character: Thin   Cervical exam deferred        Extremities: Normal range of motion.  Edema: None  Mental Status: Normal mood and affect. Normal behavior. Normal judgment and thought content.   Urinalysis:      Assessment and Plan:  Pregnancy: G3P2001 at [redacted]w[redacted]d  1. Supervision of other normal pregnancy, antepartum, second trimester  - [place] MFM OB FOLLOW UP; Future  2. Fever and chills  - Influenza A & B PCR - zpack  Preterm labor symptoms and general obstetric precautions including but not limited to vaginal bleeding, contractions, leaking of fluid and fetal movement were reviewed  in detail with the patient. Please refer to After Visit Summary for other counseling recommendations.  Return in about 4 weeks (around 07/28/2015).   Emily Filbert, MD

## 2015-07-17 ENCOUNTER — Telehealth: Payer: Self-pay

## 2015-07-17 NOTE — Telephone Encounter (Signed)
Need to move appt time to earlier that day or another day, provider needs to leave early that day for a meeting. Called patient, no answer, left message instructing her to return my call here at the office

## 2015-07-27 ENCOUNTER — Ambulatory Visit (HOSPITAL_COMMUNITY)
Admission: RE | Admit: 2015-07-27 | Discharge: 2015-07-27 | Disposition: A | Discharge status: Home / Self Care | Payer: BC Managed Care – PPO | Source: Ambulatory Visit | Attending: Obstetrics & Gynecology | Admitting: Obstetrics & Gynecology

## 2015-07-27 ENCOUNTER — Other Ambulatory Visit: Payer: Self-pay | Admitting: Obstetrics & Gynecology

## 2015-07-27 DIAGNOSIS — Z3482 Encounter for supervision of other normal pregnancy, second trimester: Secondary | ICD-10-CM

## 2015-07-27 DIAGNOSIS — Z36 Encounter for antenatal screening of mother: Secondary | ICD-10-CM | POA: Diagnosis not present

## 2015-07-27 DIAGNOSIS — Z3A22 22 weeks gestation of pregnancy: Secondary | ICD-10-CM | POA: Diagnosis not present

## 2015-07-27 DIAGNOSIS — O09522 Supervision of elderly multigravida, second trimester: Secondary | ICD-10-CM | POA: Insufficient documentation

## 2015-07-27 DIAGNOSIS — O34219 Maternal care for unspecified type scar from previous cesarean delivery: Secondary | ICD-10-CM | POA: Insufficient documentation

## 2015-07-27 DIAGNOSIS — Z1389 Encounter for screening for other disorder: Secondary | ICD-10-CM

## 2015-07-27 DIAGNOSIS — O4420 Partial placenta previa NOS or without hemorrhage, unspecified trimester: Secondary | ICD-10-CM

## 2015-07-27 DIAGNOSIS — Z98891 History of uterine scar from previous surgery: Secondary | ICD-10-CM

## 2015-07-28 ENCOUNTER — Encounter: Payer: BC Managed Care – PPO | Admitting: Obstetrics & Gynecology

## 2015-07-31 DIAGNOSIS — O4420 Partial placenta previa NOS or without hemorrhage, unspecified trimester: Secondary | ICD-10-CM | POA: Insufficient documentation

## 2015-08-04 ENCOUNTER — Ambulatory Visit (INDEPENDENT_AMBULATORY_CARE_PROVIDER_SITE_OTHER): Payer: BC Managed Care – PPO | Admitting: Family Medicine

## 2015-08-04 VITALS — BP 115/67 | HR 81 | Wt 159.0 lb

## 2015-08-04 DIAGNOSIS — Z3482 Encounter for supervision of other normal pregnancy, second trimester: Secondary | ICD-10-CM

## 2015-08-04 DIAGNOSIS — O4420 Partial placenta previa NOS or without hemorrhage, unspecified trimester: Secondary | ICD-10-CM

## 2015-08-04 DIAGNOSIS — O34219 Maternal care for unspecified type scar from previous cesarean delivery: Secondary | ICD-10-CM

## 2015-08-04 NOTE — Patient Instructions (Signed)
Third Trimester of Pregnancy The third trimester is from week 29 through week 42, months 7 through 9. The third trimester is a time when the fetus is growing rapidly. At the end of the ninth month, the fetus is about 20 inches in length and weighs 6-10 pounds.  BODY CHANGES Your body goes through many changes during pregnancy. The changes vary from woman to woman.   Your weight will continue to increase. You can expect to gain 25-35 pounds (11-16 kg) by the end of the pregnancy.  You may begin to get stretch marks on your hips, abdomen, and breasts.  You may urinate more often because the fetus is moving lower into your pelvis and pressing on your bladder.  You may develop or continue to have heartburn as a result of your pregnancy.  You may develop constipation because certain hormones are causing the muscles that push waste through your intestines to slow down.  You may develop hemorrhoids or swollen, bulging veins (varicose veins).  You may have pelvic pain because of the weight gain and pregnancy hormones relaxing your joints between the bones in your pelvis. Backaches may result from overexertion of the muscles supporting your posture.  You may have changes in your hair. These can include thickening of your hair, rapid growth, and changes in texture. Some women also have hair loss during or after pregnancy, or hair that feels dry or thin. Your hair will most likely return to normal after your baby is born.  Your breasts will continue to grow and be tender. A yellow discharge may leak from your breasts called colostrum.  Your belly button may stick out.  You may feel short of breath because of your expanding uterus.  You may notice the fetus "dropping," or moving lower in your abdomen.  You may have a bloody mucus discharge. This usually occurs a few days to a week before labor begins.  Your cervix becomes thin and soft (effaced) near your due date. WHAT TO EXPECT AT YOUR  PRENATAL EXAMS  You will have prenatal exams every 2 weeks until week 36. Then, you will have weekly prenatal exams. During a routine prenatal visit:  You will be weighed to make sure you and the fetus are growing normally.  Your blood pressure is taken.  Your abdomen will be measured to track your baby's growth.  The fetal heartbeat will be listened to.  Any test results from the previous visit will be discussed.  You may have a cervical check near your due date to see if you have effaced. At around 36 weeks, your caregiver will check your cervix. At the same time, your caregiver will also perform a test on the secretions of the vaginal tissue. This test is to determine if a type of bacteria, Group B streptococcus, is present. Your caregiver will explain this further. Your caregiver may ask you:  What your birth plan is.  How you are feeling.  If you are feeling the baby move.  If you have had any abnormal symptoms, such as leaking fluid, bleeding, severe headaches, or abdominal cramping.  If you are using any tobacco products, including cigarettes, chewing tobacco, and electronic cigarettes.  If you have any questions. Other tests or screenings that may be performed during your third trimester include:  Blood tests that check for low iron levels (anemia).  Fetal testing to check the health, activity level, and growth of the fetus. Testing is done if you have certain medical conditions or if   there are problems during the pregnancy.  HIV (human immunodeficiency virus) testing. If you are at high risk, you may be screened for HIV during your third trimester of pregnancy. FALSE LABOR You may feel small, irregular contractions that eventually go away. These are called Braxton Hicks contractions, or false labor. Contractions may last for hours, days, or even weeks before true labor sets in. If contractions come at regular intervals, intensify, or become painful, it is best to be seen  by your caregiver.  SIGNS OF LABOR   Menstrual-like cramps.  Contractions that are 5 minutes apart or less.  Contractions that start on the top of the uterus and spread down to the lower abdomen and back.  A sense of increased pelvic pressure or back pain.  A watery or bloody mucus discharge that comes from the vagina. If you have any of these signs before the 37th week of pregnancy, call your caregiver right away. You need to go to the hospital to get checked immediately. HOME CARE INSTRUCTIONS   Avoid all smoking, herbs, alcohol, and unprescribed drugs. These chemicals affect the formation and growth of the baby.  Do not use any tobacco products, including cigarettes, chewing tobacco, and electronic cigarettes. If you need help quitting, ask your health care provider. You may receive counseling support and other resources to help you quit.  Follow your caregiver's instructions regarding medicine use. There are medicines that are either safe or unsafe to take during pregnancy.  Exercise only as directed by your caregiver. Experiencing uterine cramps is a good sign to stop exercising.  Continue to eat regular, healthy meals.  Wear a good support bra for breast tenderness.  Do not use hot tubs, steam rooms, or saunas.  Wear your seat belt at all times when driving.  Avoid raw meat, uncooked cheese, cat litter boxes, and soil used by cats. These carry germs that can cause birth defects in the baby.  Take your prenatal vitamins.  Take 1500-2000 mg of calcium daily starting at the 20th week of pregnancy until you deliver your baby.  Try taking a stool softener (if your caregiver approves) if you develop constipation. Eat more high-fiber foods, such as fresh vegetables or fruit and whole grains. Drink plenty of fluids to keep your urine clear or pale yellow.  Take warm sitz baths to soothe any pain or discomfort caused by hemorrhoids. Use hemorrhoid cream if your caregiver  approves.  If you develop varicose veins, wear support hose. Elevate your feet for 15 minutes, 3-4 times a day. Limit salt in your diet.  Avoid heavy lifting, wear low heal shoes, and practice good posture.  Rest a lot with your legs elevated if you have leg cramps or low back pain.  Visit your dentist if you have not gone during your pregnancy. Use a soft toothbrush to brush your teeth and be gentle when you floss.  A sexual relationship may be continued unless your caregiver directs you otherwise.  Do not travel far distances unless it is absolutely necessary and only with the approval of your caregiver.  Take prenatal classes to understand, practice, and ask questions about the labor and delivery.  Make a trial run to the hospital.  Pack your hospital bag.  Prepare the baby's nursery.  Continue to go to all your prenatal visits as directed by your caregiver. SEEK MEDICAL CARE IF:  You are unsure if you are in labor or if your water has broken.  You have dizziness.  You have   mild pelvic cramps, pelvic pressure, or nagging pain in your abdominal area.  You have persistent nausea, vomiting, or diarrhea.  You have a bad smelling vaginal discharge.  You have pain with urination. SEEK IMMEDIATE MEDICAL CARE IF:   You have a fever.  You are leaking fluid from your vagina.  You have spotting or bleeding from your vagina.  You have severe abdominal cramping or pain.  You have rapid weight loss or gain.  You have shortness of breath with chest pain.  You notice sudden or extreme swelling of your face, hands, ankles, feet, or legs.  You have not felt your baby move in over an hour.  You have severe headaches that do not go away with medicine.  You have vision changes.   This information is not intended to replace advice given to you by your health care provider. Make sure you discuss any questions you have with your health care provider.   Document Released:  05/03/2001 Document Revised: 05/30/2014 Document Reviewed: 07/10/2012 Elsevier Interactive Patient Education 2016 [employer]  Breastfeeding Deciding to breastfeed is one of the best choices you can make for you and your baby. A change in hormones during pregnancy causes your breast tissue to grow and increases the number and size of your milk ducts. These hormones also allow proteins, sugars, and fats from your blood supply to make breast milk in your milk-producing glands. Hormones prevent breast milk from being released before your baby is born as well as prompt milk flow after birth. Once breastfeeding has begun, thoughts of your baby, as well as his or her sucking or crying, can stimulate the release of milk from your milk-producing glands.  BENEFITS OF BREASTFEEDING For Your Baby  Your first milk (colostrum) helps your baby's digestive system function better.  There are antibodies in your milk that help your baby fight off infections.  Your baby has a lower incidence of asthma, allergies, and sudden infant death syndrome.  The nutrients in breast milk are better for your baby than infant formulas and are designed uniquely for your baby's needs.  Breast milk improves your baby's brain development.  Your baby is less likely to develop other conditions, such as childhood obesity, asthma, or type 2 diabetes mellitus. For You  Breastfeeding helps to create a very special bond between you and your baby.  Breastfeeding is convenient. Breast milk is always available at the correct temperature and costs nothing.  Breastfeeding helps to burn calories and helps you lose the weight gained during pregnancy.  Breastfeeding makes your uterus contract to its prepregnancy size faster and slows bleeding (lochia) after you give birth.   Breastfeeding helps to lower your risk of developing type 2 diabetes mellitus, osteoporosis, and breast or ovarian cancer later in life. SIGNS THAT YOUR BABY IS  HUNGRY Early Signs of Hunger  Increased alertness or activity.  Stretching.  Movement of the head from side to side.  Movement of the head and opening of the mouth when the corner of the mouth or cheek is stroked (rooting).  Increased sucking sounds, smacking lips, cooing, sighing, or squeaking.  Hand-to-mouth movements.  Increased sucking of fingers or hands. Late Signs of Hunger  Fussing.  Intermittent crying. Extreme Signs of Hunger Signs of extreme hunger will require calming and consoling before your baby will be able to breastfeed successfully. Do not wait for the following signs of extreme hunger to occur before you initiate breastfeeding:  Restlessness.  A loud, strong cry.  Screaming.   BREASTFEEDING BASICS Breastfeeding Initiation  Find a comfortable place to sit or lie down, with your neck and back well supported.  Place a pillow or rolled up blanket under your baby to bring him or her to the level of your breast (if you are seated). Nursing pillows are specially designed to help support your arms and your baby while you breastfeed.  Make sure that your baby's abdomen is facing your abdomen.  Gently massage your breast. With your fingertips, massage from your chest wall toward your nipple in a circular motion. This encourages milk flow. You may need to continue this action during the feeding if your milk flows slowly.  Support your breast with 4 fingers underneath and your thumb above your nipple. Make sure your fingers are well away from your nipple and your baby's mouth.  Stroke your baby's lips gently with your finger or nipple.  When your baby's mouth is open wide enough, quickly bring your baby to your breast, placing your entire nipple and as much of the colored area around your nipple (areola) as possible into your baby's mouth.  More areola should be visible above your baby's upper lip than below the lower lip.  Your baby's tongue should be between his  or her lower gum and your breast.  Ensure that your baby's mouth is correctly positioned around your nipple (latched). Your baby's lips should create a seal on your breast and be turned out (everted).  It is common for your baby to suck about 2-3 minutes in order to start the flow of breast milk. Latching Teaching your baby how to latch on to your breast properly is very important. An improper latch can cause nipple pain and decreased milk supply for you and poor weight gain in your baby. Also, if your baby is not latched onto your nipple properly, he or she may swallow some air during feeding. This can make your baby fussy. Burping your baby when you switch breasts during the feeding can help to get rid of the air. However, teaching your baby to latch on properly is still the best way to prevent fussiness from swallowing air while breastfeeding. Signs that your baby has successfully latched on to your nipple:  Silent tugging or silent sucking, without causing you pain.  Swallowing heard between every 3-4 sucks.  Muscle movement above and in front of his or her ears while sucking. Signs that your baby has not successfully latched on to nipple:  Sucking sounds or smacking sounds from your baby while breastfeeding.  Nipple pain. If you think your baby has not latched on correctly, slip your finger into the corner of your baby's mouth to break the suction and place it between your baby's gums. Attempt breastfeeding initiation again. Signs of Successful Breastfeeding Signs from your baby:  A gradual decrease in the number of sucks or complete cessation of sucking.  Falling asleep.  Relaxation of his or her body.  Retention of a small amount of milk in his or her mouth.  Letting go of your breast by himself or herself. Signs from you:  Breasts that have increased in firmness, weight, and size 1-3 hours after feeding.  Breasts that are softer immediately after  breastfeeding.  Increased milk volume, as well as a change in milk consistency and color by the fifth day of breastfeeding.  Nipples that are not sore, cracked, or bleeding. Signs That Your Baby is Getting Enough Milk  Wetting at least 3 diapers in a 24-hour period.   The urine should be clear and pale yellow by age 5 days.  At least 3 stools in a 24-hour period by age 5 days. The stool should be soft and yellow.  At least 3 stools in a 24-hour period by age 7 days. The stool should be seedy and yellow.  No loss of weight greater than 10% of birth weight during the first 3 days of age.  Average weight gain of 4-7 ounces (113-198 g) per week after age 4 days.  Consistent daily weight gain by age 5 days, without weight loss after the age of 2 weeks. After a feeding, your baby may spit up a small amount. This is common. BREASTFEEDING FREQUENCY AND DURATION Frequent feeding will help you make more milk and can prevent sore nipples and breast engorgement. Breastfeed when you feel the need to reduce the fullness of your breasts or when your baby shows signs of hunger. This is called "breastfeeding on demand." Avoid introducing a pacifier to your baby while you are working to establish breastfeeding (the first 4-6 weeks after your baby is born). After this time you may choose to use a pacifier. Research has shown that pacifier use during the first year of a baby's life decreases the risk of sudden infant death syndrome (SIDS). Allow your baby to feed on each breast as long as he or she wants. Breastfeed until your baby is finished feeding. When your baby unlatches or falls asleep while feeding from the first breast, offer the second breast. Because newborns are often sleepy in the first few weeks of life, you may need to awaken your baby to get him or her to feed. Breastfeeding times will vary from baby to baby. However, the following rules can serve as a guide to help you ensure that your baby is  properly fed:  Newborns (babies 4 weeks of age or younger) may breastfeed every 1-3 hours.  Newborns should not go longer than 3 hours during the day or 5 hours during the night without breastfeeding.  You should breastfeed your baby a minimum of 8 times in a 24-hour period until you begin to introduce solid foods to your baby at around 6 months of age. BREAST MILK PUMPING Pumping and storing breast milk allows you to ensure that your baby is exclusively fed your breast milk, even at times when you are unable to breastfeed. This is especially important if you are going back to work while you are still breastfeeding or when you are not able to be present during feedings. Your lactation consultant can give you guidelines on how long it is safe to store breast milk. A breast pump is a machine that allows you to pump milk from your breast into a sterile bottle. The pumped breast milk can then be stored in a refrigerator or freezer. Some breast pumps are operated by hand, while others use electricity. Ask your lactation consultant which type will work best for you. Breast pumps can be purchased, but some hospitals and breastfeeding support groups lease breast pumps on a monthly basis. A lactation consultant can teach you how to hand express breast milk, if you prefer not to use a pump. CARING FOR YOUR BREASTS WHILE YOU BREASTFEED Nipples can become dry, cracked, and sore while breastfeeding. The following recommendations can help keep your breasts moisturized and healthy:  Avoid using soap on your nipples.  Wear a supportive bra. Although not required, special nursing bras and tank tops are designed to allow access to your   breasts for breastfeeding without taking off your entire bra or top. Avoid wearing underwire-style bras or extremely tight bras.  Air dry your nipples for 3-4minutes after each feeding.  Use only cotton bra pads to absorb leaked breast milk. Leaking of breast milk between feedings  is normal.  Use lanolin on your nipples after breastfeeding. Lanolin helps to maintain your skin's normal moisture barrier. If you use pure lanolin, you do not need to wash it off before feeding your baby again. Pure lanolin is not toxic to your baby. You may also hand express a few drops of breast milk and gently massage that milk into your nipples and allow the milk to air dry. In the first few weeks after giving birth, some women experience extremely full breasts (engorgement). Engorgement can make your breasts feel heavy, warm, and tender to the touch. Engorgement peaks within 3-5 days after you give birth. The following recommendations can help ease engorgement:  Completely empty your breasts while breastfeeding or pumping. You may want to start by applying warm, moist heat (in the shower or with warm water-soaked hand towels) just before feeding or pumping. This increases circulation and helps the milk flow. If your baby does not completely empty your breasts while breastfeeding, pump any extra milk after he or she is finished.  Wear a snug bra (nursing or regular) or tank top for 1-2 days to signal your body to slightly decrease milk production.  Apply ice packs to your breasts, unless this is too uncomfortable for you.  Make sure that your baby is latched on and positioned properly while breastfeeding. If engorgement persists after 48 hours of following these recommendations, contact your health care provider or a lactation consultant. OVERALL HEALTH CARE RECOMMENDATIONS WHILE BREASTFEEDING  Eat healthy foods. Alternate between meals and snacks, eating 3 of each per day. Because what you eat affects your breast milk, some of the foods may make your baby more irritable than usual. Avoid eating these foods if you are sure that they are negatively affecting your baby.  Drink milk, fruit juice, and water to satisfy your thirst (about 10 glasses a day).  Rest often, relax, and continue to take  your prenatal vitamins to prevent fatigue, stress, and anemia.  Continue breast self-awareness checks.  Avoid chewing and smoking tobacco. Chemicals from cigarettes that pass into breast milk and exposure to secondhand smoke may harm your baby.  Avoid alcohol and drug use, including marijuana. Some medicines that may be harmful to your baby can pass through breast milk. It is important to ask your health care provider before taking any medicine, including all over-the-counter and prescription medicine as well as vitamin and herbal supplements. It is possible to become pregnant while breastfeeding. If birth control is desired, ask your health care provider about options that will be safe for your baby. SEEK MEDICAL CARE IF:  You feel like you want to stop breastfeeding or have become frustrated with breastfeeding.  You have painful breasts or nipples.  Your nipples are cracked or bleeding.  Your breasts are red, tender, or warm.  You have a swollen area on either breast.  You have a fever or chills.  You have nausea or vomiting.  You have drainage other than breast milk from your nipples.  Your breasts do not become full before feedings by the fifth day after you give birth.  You feel sad and depressed.  Your baby is too sleepy to eat well.  Your baby is having trouble sleeping.     Your baby is wetting less than 3 diapers in a 24-hour period.  Your baby has less than 3 stools in a 24-hour period.  Your baby's skin or the white part of his or her eyes becomes yellow.   Your baby is not gaining weight by 5 days of age. SEEK IMMEDIATE MEDICAL CARE IF:  Your baby is overly tired (lethargic) and does not want to wake up and feed.  Your baby develops an unexplained fever.   This information is not intended to replace advice given to you by your health care provider. Make sure you discuss any questions you have with your health care provider.   Document Released: 05/09/2005  Document Revised: 01/28/2015 Document Reviewed: 10/31/2012 Elsevier Interactive Patient Education 2016 [employer]  

## 2015-08-05 NOTE — Progress Notes (Signed)
Subjective:  Danielle Sawyer is a 35 y.o. G3P2001 at [redacted]w[redacted]d being seen today for ongoing prenatal care.  She is currently monitored for the following issues for this high-risk pregnancy and has Pregnancy with early neonatal death; Supervision of other normal pregnancy, antepartum; Previous cesarean delivery, delivered; and Marginal placenta previa on her problem list.  Patient reports no complaints.  Contractions: Not present. Vag. Bleeding: None.  Movement: Present. Denies leaking of fluid.   The following portions of the patient's history were reviewed and updated as appropriate: allergies, current medications, past family history, past medical history, past social history, past surgical history and problem list. Problem list updated.  Objective:   Filed Vitals:   08/04/15 1558  BP: 115/67  Pulse: 81  Weight: 159 lb (72.122 kg)    Fetal Status: Fetal Heart Rate (bpm): 133   Movement: Present     General:  Alert, oriented and cooperative. Patient is in no acute distress.  Skin: Skin is warm and dry. No rash noted.   Cardiovascular: Normal heart rate noted  Respiratory: Normal respiratory effort, no problems with respiration noted  Abdomen: Soft, gravid, appropriate for gestational age. Pain/Pressure: Absent     Pelvic: Vag. Bleeding: None Vag D/C Character: Thin   Cervical exam deferred        Extremities: Normal range of motion.  Edema: None  Mental Status: Normal mood and affect. Normal behavior. Normal judgment and thought content.   Urinalysis: Urine Protein: Negative Urine Glucose: Negative  Assessment and Plan:  Pregnancy: G3P2001 at [redacted]w[redacted]d  1. Supervision of other normal pregnancy, antepartum, second trimester Continue routine prenatal care. 28 wk labs next visit with [facility]  2. Previous cesarean delivery, delivered For [facility]RL[facility]  3. Marginal placenta previa No bleeding - precautions reviewed -order u/s for f/u placental location at next visit.  Preterm labor symptoms  and general obstetric precautions including but not limited to vaginal bleeding, contractions, leaking of fluid and fetal movement were reviewed in detail with the patient. Please refer to After Visit Summary for other counseling recommendations.  Return in 2 weeks (on 08/18/2015) for 28 wk labs.   Donnamae Jude, MD

## 2015-08-21 ENCOUNTER — Ambulatory Visit (INDEPENDENT_AMBULATORY_CARE_PROVIDER_SITE_OTHER): Payer: BC Managed Care – PPO | Admitting: Family Medicine

## 2015-08-21 VITALS — BP 115/73 | HR 97 | Wt 161.0 lb

## 2015-08-21 DIAGNOSIS — Z36 Encounter for antenatal screening of mother: Secondary | ICD-10-CM | POA: Diagnosis not present

## 2015-08-21 DIAGNOSIS — Z3492 Encounter for supervision of normal pregnancy, unspecified, second trimester: Secondary | ICD-10-CM

## 2015-08-21 DIAGNOSIS — O4420 Partial placenta previa NOS or without hemorrhage, unspecified trimester: Secondary | ICD-10-CM

## 2015-08-21 DIAGNOSIS — Z23 Encounter for immunization: Secondary | ICD-10-CM | POA: Diagnosis not present

## 2015-08-21 DIAGNOSIS — Z3482 Encounter for supervision of other normal pregnancy, second trimester: Secondary | ICD-10-CM

## 2015-08-21 LAB — CBC
HEMATOCRIT: 34.1 % — AB (ref 36.0–46.0)
HEMOGLOBIN: 11.4 g/dL — AB (ref 12.0–15.0)
MCH: 31.8 pg (ref 26.0–34.0)
MCHC: 33.4 g/dL (ref 30.0–36.0)
MCV: 95.3 fL (ref 78.0–100.0)
MPV: 9.9 fL (ref 8.6–12.4)
Platelets: 228 10*3/uL (ref 150–400)
RBC: 3.58 MIL/uL — ABNORMAL LOW (ref 3.87–5.11)
RDW: 13.4 % (ref 11.5–15.5)
WBC: 11.5 10*3/uL — AB (ref 4.0–10.5)

## 2015-08-21 NOTE — Patient Instructions (Signed)
Third Trimester of Pregnancy The third trimester is from week 29 through week 42, months 7 through 9. The third trimester is a time when the fetus is growing rapidly. At the end of the ninth month, the fetus is about 20 inches in length and weighs 6-10 pounds.  BODY CHANGES Your body goes through many changes during pregnancy. The changes vary from woman to woman.   Your weight will continue to increase. You can expect to gain 25-35 pounds (11-16 kg) by the end of the pregnancy.  You may begin to get stretch marks on your hips, abdomen, and breasts.  You may urinate more often because the fetus is moving lower into your pelvis and pressing on your bladder.  You may develop or continue to have heartburn as a result of your pregnancy.  You may develop constipation because certain hormones are causing the muscles that push waste through your intestines to slow down.  You may develop hemorrhoids or swollen, bulging veins (varicose veins).  You may have pelvic pain because of the weight gain and pregnancy hormones relaxing your joints between the bones in your pelvis. Backaches may result from overexertion of the muscles supporting your posture.  You may have changes in your hair. These can include thickening of your hair, rapid growth, and changes in texture. Some women also have hair loss during or after pregnancy, or hair that feels dry or thin. Your hair will most likely return to normal after your baby is born.  Your breasts will continue to grow and be tender. A yellow discharge may leak from your breasts called colostrum.  Your belly button may stick out.  You may feel short of breath because of your expanding uterus.  You may notice the fetus "dropping," or moving lower in your abdomen.  You may have a bloody mucus discharge. This usually occurs a few days to a week before labor begins.  Your cervix becomes thin and soft (effaced) near your due date. WHAT TO EXPECT AT YOUR  PRENATAL EXAMS  You will have prenatal exams every 2 weeks until week 36. Then, you will have weekly prenatal exams. During a routine prenatal visit:  You will be weighed to make sure you and the fetus are growing normally.  Your blood pressure is taken.  Your abdomen will be measured to track your baby's growth.  The fetal heartbeat will be listened to.  Any test results from the previous visit will be discussed.  You may have a cervical check near your due date to see if you have effaced. At around 36 weeks, your caregiver will check your cervix. At the same time, your caregiver will also perform a test on the secretions of the vaginal tissue. This test is to determine if a type of bacteria, Group B streptococcus, is present. Your caregiver will explain this further. Your caregiver may ask you:  What your birth plan is.  How you are feeling.  If you are feeling the baby move.  If you have had any abnormal symptoms, such as leaking fluid, bleeding, severe headaches, or abdominal cramping.  If you are using any tobacco products, including cigarettes, chewing tobacco, and electronic cigarettes.  If you have any questions. Other tests or screenings that may be performed during your third trimester include:  Blood tests that check for low iron levels (anemia).  Fetal testing to check the health, activity level, and growth of the fetus. Testing is done if you have certain medical conditions or if   there are problems during the pregnancy.  HIV (human immunodeficiency virus) testing. If you are at high risk, you may be screened for HIV during your third trimester of pregnancy. FALSE LABOR You may feel small, irregular contractions that eventually go away. These are called Braxton Hicks contractions, or false labor. Contractions may last for hours, days, or even weeks before true labor sets in. If contractions come at regular intervals, intensify, or become painful, it is best to be seen  by your caregiver.  SIGNS OF LABOR   Menstrual-like cramps.  Contractions that are 5 minutes apart or less.  Contractions that start on the top of the uterus and spread down to the lower abdomen and back.  A sense of increased pelvic pressure or back pain.  A watery or bloody mucus discharge that comes from the vagina. If you have any of these signs before the 37th week of pregnancy, call your caregiver right away. You need to go to the hospital to get checked immediately. HOME CARE INSTRUCTIONS   Avoid all smoking, herbs, alcohol, and unprescribed drugs. These chemicals affect the formation and growth of the baby.  Do not use any tobacco products, including cigarettes, chewing tobacco, and electronic cigarettes. If you need help quitting, ask your health care provider. You may receive counseling support and other resources to help you quit.  Follow your caregiver's instructions regarding medicine use. There are medicines that are either safe or unsafe to take during pregnancy.  Exercise only as directed by your caregiver. Experiencing uterine cramps is a good sign to stop exercising.  Continue to eat regular, healthy meals.  Wear a good support bra for breast tenderness.  Do not use hot tubs, steam rooms, or saunas.  Wear your seat belt at all times when driving.  Avoid raw meat, uncooked cheese, cat litter boxes, and soil used by cats. These carry germs that can cause birth defects in the baby.  Take your prenatal vitamins.  Take 1500-2000 mg of calcium daily starting at the 20th week of pregnancy until you deliver your baby.  Try taking a stool softener (if your caregiver approves) if you develop constipation. Eat more high-fiber foods, such as fresh vegetables or fruit and whole grains. Drink plenty of fluids to keep your urine clear or pale yellow.  Take warm sitz baths to soothe any pain or discomfort caused by hemorrhoids. Use hemorrhoid cream if your caregiver  approves.  If you develop varicose veins, wear support hose. Elevate your feet for 15 minutes, 3-4 times a day. Limit salt in your diet.  Avoid heavy lifting, wear low heal shoes, and practice good posture.  Rest a lot with your legs elevated if you have leg cramps or low back pain.  Visit your dentist if you have not gone during your pregnancy. Use a soft toothbrush to brush your teeth and be gentle when you floss.  A sexual relationship may be continued unless your caregiver directs you otherwise.  Do not travel far distances unless it is absolutely necessary and only with the approval of your caregiver.  Take prenatal classes to understand, practice, and ask questions about the labor and delivery.  Make a trial run to the hospital.  Pack your hospital bag.  Prepare the baby's nursery.  Continue to go to all your prenatal visits as directed by your caregiver. SEEK MEDICAL CARE IF:  You are unsure if you are in labor or if your water has broken.  You have dizziness.  You have   mild pelvic cramps, pelvic pressure, or nagging pain in your abdominal area.  You have persistent nausea, vomiting, or diarrhea.  You have a bad smelling vaginal discharge.  You have pain with urination. SEEK IMMEDIATE MEDICAL CARE IF:   You have a fever.  You are leaking fluid from your vagina.  You have spotting or bleeding from your vagina.  You have severe abdominal cramping or pain.  You have rapid weight loss or gain.  You have shortness of breath with chest pain.  You notice sudden or extreme swelling of your face, hands, ankles, feet, or legs.  You have not felt your baby move in over an hour.  You have severe headaches that do not go away with medicine.  You have vision changes.   This information is not intended to replace advice given to you by your health care provider. Make sure you discuss any questions you have with your health care provider.   Document Released:  05/03/2001 Document Revised: 05/30/2014 Document Reviewed: 07/10/2012 Elsevier Interactive Patient Education 2016 [employer]  Breastfeeding Deciding to breastfeed is one of the best choices you can make for you and your baby. A change in hormones during pregnancy causes your breast tissue to grow and increases the number and size of your milk ducts. These hormones also allow proteins, sugars, and fats from your blood supply to make breast milk in your milk-producing glands. Hormones prevent breast milk from being released before your baby is born as well as prompt milk flow after birth. Once breastfeeding has begun, thoughts of your baby, as well as his or her sucking or crying, can stimulate the release of milk from your milk-producing glands.  BENEFITS OF BREASTFEEDING For Your Baby  Your first milk (colostrum) helps your baby's digestive system function better.  There are antibodies in your milk that help your baby fight off infections.  Your baby has a lower incidence of asthma, allergies, and sudden infant death syndrome.  The nutrients in breast milk are better for your baby than infant formulas and are designed uniquely for your baby's needs.  Breast milk improves your baby's brain development.  Your baby is less likely to develop other conditions, such as childhood obesity, asthma, or type 2 diabetes mellitus. For You  Breastfeeding helps to create a very special bond between you and your baby.  Breastfeeding is convenient. Breast milk is always available at the correct temperature and costs nothing.  Breastfeeding helps to burn calories and helps you lose the weight gained during pregnancy.  Breastfeeding makes your uterus contract to its prepregnancy size faster and slows bleeding (lochia) after you give birth.   Breastfeeding helps to lower your risk of developing type 2 diabetes mellitus, osteoporosis, and breast or ovarian cancer later in life. SIGNS THAT YOUR BABY IS  HUNGRY Early Signs of Hunger  Increased alertness or activity.  Stretching.  Movement of the head from side to side.  Movement of the head and opening of the mouth when the corner of the mouth or cheek is stroked (rooting).  Increased sucking sounds, smacking lips, cooing, sighing, or squeaking.  Hand-to-mouth movements.  Increased sucking of fingers or hands. Late Signs of Hunger  Fussing.  Intermittent crying. Extreme Signs of Hunger Signs of extreme hunger will require calming and consoling before your baby will be able to breastfeed successfully. Do not wait for the following signs of extreme hunger to occur before you initiate breastfeeding:  Restlessness.  A loud, strong cry.  Screaming.   BREASTFEEDING BASICS Breastfeeding Initiation  Find a comfortable place to sit or lie down, with your neck and back well supported.  Place a pillow or rolled up blanket under your baby to bring him or her to the level of your breast (if you are seated). Nursing pillows are specially designed to help support your arms and your baby while you breastfeed.  Make sure that your baby's abdomen is facing your abdomen.  Gently massage your breast. With your fingertips, massage from your chest wall toward your nipple in a circular motion. This encourages milk flow. You may need to continue this action during the feeding if your milk flows slowly.  Support your breast with 4 fingers underneath and your thumb above your nipple. Make sure your fingers are well away from your nipple and your baby's mouth.  Stroke your baby's lips gently with your finger or nipple.  When your baby's mouth is open wide enough, quickly bring your baby to your breast, placing your entire nipple and as much of the colored area around your nipple (areola) as possible into your baby's mouth.  More areola should be visible above your baby's upper lip than below the lower lip.  Your baby's tongue should be between his  or her lower gum and your breast.  Ensure that your baby's mouth is correctly positioned around your nipple (latched). Your baby's lips should create a seal on your breast and be turned out (everted).  It is common for your baby to suck about 2-3 minutes in order to start the flow of breast milk. Latching Teaching your baby how to latch on to your breast properly is very important. An improper latch can cause nipple pain and decreased milk supply for you and poor weight gain in your baby. Also, if your baby is not latched onto your nipple properly, he or she may swallow some air during feeding. This can make your baby fussy. Burping your baby when you switch breasts during the feeding can help to get rid of the air. However, teaching your baby to latch on properly is still the best way to prevent fussiness from swallowing air while breastfeeding. Signs that your baby has successfully latched on to your nipple:  Silent tugging or silent sucking, without causing you pain.  Swallowing heard between every 3-4 sucks.  Muscle movement above and in front of his or her ears while sucking. Signs that your baby has not successfully latched on to nipple:  Sucking sounds or smacking sounds from your baby while breastfeeding.  Nipple pain. If you think your baby has not latched on correctly, slip your finger into the corner of your baby's mouth to break the suction and place it between your baby's gums. Attempt breastfeeding initiation again. Signs of Successful Breastfeeding Signs from your baby:  A gradual decrease in the number of sucks or complete cessation of sucking.  Falling asleep.  Relaxation of his or her body.  Retention of a small amount of milk in his or her mouth.  Letting go of your breast by himself or herself. Signs from you:  Breasts that have increased in firmness, weight, and size 1-3 hours after feeding.  Breasts that are softer immediately after  breastfeeding.  Increased milk volume, as well as a change in milk consistency and color by the fifth day of breastfeeding.  Nipples that are not sore, cracked, or bleeding. Signs That Your Baby is Getting Enough Milk  Wetting at least 3 diapers in a 24-hour period.   The urine should be clear and pale yellow by age 5 days.  At least 3 stools in a 24-hour period by age 5 days. The stool should be soft and yellow.  At least 3 stools in a 24-hour period by age 7 days. The stool should be seedy and yellow.  No loss of weight greater than 10% of birth weight during the first 3 days of age.  Average weight gain of 4-7 ounces (113-198 g) per week after age 4 days.  Consistent daily weight gain by age 5 days, without weight loss after the age of 2 weeks. After a feeding, your baby may spit up a small amount. This is common. BREASTFEEDING FREQUENCY AND DURATION Frequent feeding will help you make more milk and can prevent sore nipples and breast engorgement. Breastfeed when you feel the need to reduce the fullness of your breasts or when your baby shows signs of hunger. This is called "breastfeeding on demand." Avoid introducing a pacifier to your baby while you are working to establish breastfeeding (the first 4-6 weeks after your baby is born). After this time you may choose to use a pacifier. Research has shown that pacifier use during the first year of a baby's life decreases the risk of sudden infant death syndrome (SIDS). Allow your baby to feed on each breast as long as he or she wants. Breastfeed until your baby is finished feeding. When your baby unlatches or falls asleep while feeding from the first breast, offer the second breast. Because newborns are often sleepy in the first few weeks of life, you may need to awaken your baby to get him or her to feed. Breastfeeding times will vary from baby to baby. However, the following rules can serve as a guide to help you ensure that your baby is  properly fed:  Newborns (babies 4 weeks of age or younger) may breastfeed every 1-3 hours.  Newborns should not go longer than 3 hours during the day or 5 hours during the night without breastfeeding.  You should breastfeed your baby a minimum of 8 times in a 24-hour period until you begin to introduce solid foods to your baby at around 6 months of age. BREAST MILK PUMPING Pumping and storing breast milk allows you to ensure that your baby is exclusively fed your breast milk, even at times when you are unable to breastfeed. This is especially important if you are going back to work while you are still breastfeeding or when you are not able to be present during feedings. Your lactation consultant can give you guidelines on how long it is safe to store breast milk. A breast pump is a machine that allows you to pump milk from your breast into a sterile bottle. The pumped breast milk can then be stored in a refrigerator or freezer. Some breast pumps are operated by hand, while others use electricity. Ask your lactation consultant which type will work best for you. Breast pumps can be purchased, but some hospitals and breastfeeding support groups lease breast pumps on a monthly basis. A lactation consultant can teach you how to hand express breast milk, if you prefer not to use a pump. CARING FOR YOUR BREASTS WHILE YOU BREASTFEED Nipples can become dry, cracked, and sore while breastfeeding. The following recommendations can help keep your breasts moisturized and healthy:  Avoid using soap on your nipples.  Wear a supportive bra. Although not required, special nursing bras and tank tops are designed to allow access to your   breasts for breastfeeding without taking off your entire bra or top. Avoid wearing underwire-style bras or extremely tight bras.  Air dry your nipples for 3-4minutes after each feeding.  Use only cotton bra pads to absorb leaked breast milk. Leaking of breast milk between feedings  is normal.  Use lanolin on your nipples after breastfeeding. Lanolin helps to maintain your skin's normal moisture barrier. If you use pure lanolin, you do not need to wash it off before feeding your baby again. Pure lanolin is not toxic to your baby. You may also hand express a few drops of breast milk and gently massage that milk into your nipples and allow the milk to air dry. In the first few weeks after giving birth, some women experience extremely full breasts (engorgement). Engorgement can make your breasts feel heavy, warm, and tender to the touch. Engorgement peaks within 3-5 days after you give birth. The following recommendations can help ease engorgement:  Completely empty your breasts while breastfeeding or pumping. You may want to start by applying warm, moist heat (in the shower or with warm water-soaked hand towels) just before feeding or pumping. This increases circulation and helps the milk flow. If your baby does not completely empty your breasts while breastfeeding, pump any extra milk after he or she is finished.  Wear a snug bra (nursing or regular) or tank top for 1-2 days to signal your body to slightly decrease milk production.  Apply ice packs to your breasts, unless this is too uncomfortable for you.  Make sure that your baby is latched on and positioned properly while breastfeeding. If engorgement persists after 48 hours of following these recommendations, contact your health care provider or a lactation consultant. OVERALL HEALTH CARE RECOMMENDATIONS WHILE BREASTFEEDING  Eat healthy foods. Alternate between meals and snacks, eating 3 of each per day. Because what you eat affects your breast milk, some of the foods may make your baby more irritable than usual. Avoid eating these foods if you are sure that they are negatively affecting your baby.  Drink milk, fruit juice, and water to satisfy your thirst (about 10 glasses a day).  Rest often, relax, and continue to take  your prenatal vitamins to prevent fatigue, stress, and anemia.  Continue breast self-awareness checks.  Avoid chewing and smoking tobacco. Chemicals from cigarettes that pass into breast milk and exposure to secondhand smoke may harm your baby.  Avoid alcohol and drug use, including marijuana. Some medicines that may be harmful to your baby can pass through breast milk. It is important to ask your health care provider before taking any medicine, including all over-the-counter and prescription medicine as well as vitamin and herbal supplements. It is possible to become pregnant while breastfeeding. If birth control is desired, ask your health care provider about options that will be safe for your baby. SEEK MEDICAL CARE IF:  You feel like you want to stop breastfeeding or have become frustrated with breastfeeding.  You have painful breasts or nipples.  Your nipples are cracked or bleeding.  Your breasts are red, tender, or warm.  You have a swollen area on either breast.  You have a fever or chills.  You have nausea or vomiting.  You have drainage other than breast milk from your nipples.  Your breasts do not become full before feedings by the fifth day after you give birth.  You feel sad and depressed.  Your baby is too sleepy to eat well.  Your baby is having trouble sleeping.     Your baby is wetting less than 3 diapers in a 24-hour period.  Your baby has less than 3 stools in a 24-hour period.  Your baby's skin or the white part of his or her eyes becomes yellow.   Your baby is not gaining weight by 5 days of age. SEEK IMMEDIATE MEDICAL CARE IF:  Your baby is overly tired (lethargic) and does not want to wake up and feed.  Your baby develops an unexplained fever.   This information is not intended to replace advice given to you by your health care provider. Make sure you discuss any questions you have with your health care provider.   Document Released: 05/09/2005  Document Revised: 01/28/2015 Document Reviewed: 10/31/2012 Elsevier Interactive Patient Education 2016 [employer]  

## 2015-08-21 NOTE — Progress Notes (Signed)
Subjective:  Danielle Sawyer is a 35 y.o. G3P2001 at [redacted]w[redacted]d being seen today for ongoing prenatal care.  She is currently monitored for the following issues for this high-risk pregnancy and has Pregnancy with early neonatal death; Supervision of other normal pregnancy, antepartum; Previous cesarean delivery, delivered; and Marginal placenta previa on her problem list.  Patient reports no complaints.  Contractions: Not present. Vag. Bleeding: None.  Movement: Present. Denies leaking of fluid.   The following portions of the patient's history were reviewed and updated as appropriate: allergies, current medications, past family history, past medical history, past social history, past surgical history and problem list. Problem list updated.  Objective:   Filed Vitals:   08/21/15 0920  BP: 115/73  Pulse: 97  Weight: 161 lb (73.029 kg)    Fetal Status: Fetal Heart Rate (bpm): 134 Fundal Height: 27 cm Movement: Present     General:  Alert, oriented and cooperative. Patient is in no acute distress.  Skin: Skin is warm and dry. No rash noted.   Cardiovascular: Normal heart rate noted  Respiratory: Normal respiratory effort, no problems with respiration noted  Abdomen: Soft, gravid, appropriate for gestational age. Pain/Pressure: Absent     Pelvic: Vag. Bleeding: None     Cervical exam deferred        Extremities: Normal range of motion.  Edema: None  Mental Status: Normal mood and affect. Normal behavior. Normal judgment and thought content.   Urinalysis: Urine Protein: Negative Urine Glucose: Negative  Assessment and Plan:  Pregnancy: G3P2001 at [redacted]w[redacted]d  1. Supervision of normal pregnancy, second trimester  - Glucose Tolerance, 1 HR (50g) - CBC - HIV antibody - RPR - Tdap vaccine greater than or equal to 7yo IM Continue routine prenatal care.   2. Marginal placenta previa F/u u/s in 3 wks - [place] MFM OB FOLLOW UP; Future  Preterm labor symptoms and previa precautions and general  obstetric precautions including but not limited to vaginal bleeding, contractions, leaking of fluid and fetal movement were reviewed in detail with the patient. Please refer to After Visit Summary for other counseling recommendations.  Return in 2 weeks (on 09/04/2015).   Donnamae Jude, MD

## 2015-08-22 LAB — HIV ANTIBODY (ROUTINE TESTING W REFLEX): HIV: NONREACTIVE

## 2015-08-22 LAB — RPR

## 2015-08-22 LAB — GLUCOSE TOLERANCE, 1 HOUR (50G) W/O FASTING: GLUCOSE, 1 HR, GESTATIONAL: 73 mg/dL (ref <140)

## 2015-09-08 ENCOUNTER — Other Ambulatory Visit: Payer: Self-pay | Admitting: Obstetrics and Gynecology

## 2015-09-08 ENCOUNTER — Ambulatory Visit (INDEPENDENT_AMBULATORY_CARE_PROVIDER_SITE_OTHER): Payer: BC Managed Care – PPO | Admitting: Obstetrics and Gynecology

## 2015-09-08 ENCOUNTER — Ambulatory Visit (HOSPITAL_COMMUNITY)
Admission: RE | Admit: 2015-09-08 | Discharge: 2015-09-08 | Disposition: A | Discharge status: Home / Self Care | Payer: BC Managed Care – PPO | Source: Ambulatory Visit | Attending: Family Medicine | Admitting: Family Medicine

## 2015-09-08 ENCOUNTER — Encounter: Payer: Self-pay | Admitting: Obstetrics and Gynecology

## 2015-09-08 ENCOUNTER — Encounter (HOSPITAL_COMMUNITY): Payer: Self-pay

## 2015-09-08 ENCOUNTER — Other Ambulatory Visit: Payer: Self-pay | Admitting: Family Medicine

## 2015-09-08 VITALS — BP 110/68 | HR 78 | Wt 162.0 lb

## 2015-09-08 DIAGNOSIS — Z3A28 28 weeks gestation of pregnancy: Secondary | ICD-10-CM | POA: Insufficient documentation

## 2015-09-08 DIAGNOSIS — O4443 Low lying placenta NOS or without hemorrhage, third trimester: Secondary | ICD-10-CM | POA: Insufficient documentation

## 2015-09-08 DIAGNOSIS — O4440 Low lying placenta NOS or without hemorrhage, unspecified trimester: Secondary | ICD-10-CM

## 2015-09-08 DIAGNOSIS — O34219 Maternal care for unspecified type scar from previous cesarean delivery: Secondary | ICD-10-CM | POA: Insufficient documentation

## 2015-09-08 DIAGNOSIS — O4420 Partial placenta previa NOS or without hemorrhage, unspecified trimester: Secondary | ICD-10-CM

## 2015-09-08 DIAGNOSIS — O09523 Supervision of elderly multigravida, third trimester: Secondary | ICD-10-CM | POA: Insufficient documentation

## 2015-09-08 DIAGNOSIS — Z3483 Encounter for supervision of other normal pregnancy, third trimester: Secondary | ICD-10-CM

## 2015-09-08 NOTE — Progress Notes (Signed)
Subjective:  Danielle Sawyer is a 35 y.o. G3P2001 at [redacted]w[redacted]d being seen today for ongoing prenatal care.  She is currently monitored for the following issues for this high-risk pregnancy and has Pregnancy with early neonatal death; Supervision of other normal pregnancy, antepartum; Previous cesarean delivery, delivered; and Marginal placenta previa on her problem list.  Patient reports no complaints.  Contractions: Not present. Vag. Bleeding: None.  Movement: Present. Denies leaking of fluid.   The following portions of the patient's history were reviewed and updated as appropriate: allergies, current medications, past family history, past medical history, past social history, past surgical history and problem list. Problem list updated.  Objective:   Filed Vitals:   09/08/15 1615  BP: 110/68  Pulse: 78  Weight: 162 lb (73.483 kg)    Fetal Status: Fetal Heart Rate (bpm): 130   Movement: Present     General:  Alert, oriented and cooperative. Patient is in no acute distress.  Skin: Skin is warm and dry. No rash noted.   Cardiovascular: Normal heart rate noted  Respiratory: Normal respiratory effort, no problems with respiration noted  Abdomen: Soft, gravid, appropriate for gestational age. Pain/Pressure: Absent     Pelvic: Vag. Bleeding: None Vag D/C Character: Thin   Cervical exam deferred        Extremities: Normal range of motion.  Edema: None  Mental Status: Normal mood and affect. Normal behavior. Normal judgment and thought content.   Urinalysis: Urine Protein: Negative Urine Glucose: Negative  Assessment and Plan:  Pregnancy: G3P2001 at [redacted]w[redacted]d  1. Supervision of other normal pregnancy, antepartum, third trimester Patient is doing well without complaints  2. Previous cesarean delivery, delivered Will schedule for repeat cesarean section at 39 weeks. Information forwarded to [place]. Orders placed Patient undecided on contraception but does not think she wants a  tubal  Preterm labor symptoms and general obstetric precautions including but not limited to vaginal bleeding, contractions, leaking of fluid and fetal movement were reviewed in detail with the patient. Please refer to After Visit Summary for other counseling recommendations.  Return in about 2 weeks (around 09/22/2015).   Mora Bellman, MD

## 2015-09-09 ENCOUNTER — Encounter (HOSPITAL_COMMUNITY): Payer: Self-pay | Admitting: *Deleted

## 2015-09-21 ENCOUNTER — Encounter: Payer: BC Managed Care – PPO | Admitting: Obstetrics & Gynecology

## 2015-09-22 ENCOUNTER — Ambulatory Visit (INDEPENDENT_AMBULATORY_CARE_PROVIDER_SITE_OTHER): Payer: BC Managed Care – PPO | Admitting: Obstetrics & Gynecology

## 2015-09-22 ENCOUNTER — Encounter: Payer: Self-pay | Admitting: Obstetrics & Gynecology

## 2015-09-22 VITALS — BP 107/68 | HR 94 | Wt 163.0 lb

## 2015-09-22 DIAGNOSIS — O34219 Maternal care for unspecified type scar from previous cesarean delivery: Secondary | ICD-10-CM

## 2015-09-22 DIAGNOSIS — Z3483 Encounter for supervision of other normal pregnancy, third trimester: Secondary | ICD-10-CM

## 2015-09-22 DIAGNOSIS — O4420 Partial placenta previa NOS or without hemorrhage, unspecified trimester: Secondary | ICD-10-CM

## 2015-09-22 NOTE — Progress Notes (Signed)
Subjective:  Danielle Sawyer is a 35 y.o. MW G3P2001 at [redacted]w[redacted]d being seen today for ongoing prenatal care.  She is currently monitored for the following issues for this high-risk pregnancy and has Pregnancy with early neonatal death; Supervision of other normal pregnancy, antepartum; Previous cesarean delivery, delivered; and Marginal placenta previa on her problem list.  Patient reports She is absolutely certain of her LMP and date of conception. She points out that the date of her RLTCS will be determined by her LMP and she wants to stick with 11-15-15. I am in complete agreement. She always measured small with her previous pregnancies..  Contractions: Not present. Vag. Bleeding: None.  Movement: Present. Denies leaking of fluid.   The following portions of the patient's history were reviewed and updated as appropriate: allergies, current medications, past family history, past medical history, past social history, past surgical history and problem list. Problem list updated.  Objective:   Filed Vitals:   09/22/15 1619  BP: 107/68  Pulse: 94  Weight: 163 lb (73.936 kg)    Fetal Status: Fetal Heart Rate (bpm): 149 Fundal Height: 29 cm Movement: Present     General:  Alert, oriented and cooperative. Patient is in no acute distress.  Skin: Skin is warm and dry. No rash noted.   Cardiovascular: Normal heart rate noted  Respiratory: Normal respiratory effort, no problems with respiration noted  Abdomen: Soft, gravid, appropriate for gestational age. Pain/Pressure: Absent     Pelvic: Vag. Bleeding: None Vag D/C Character: Thin   Cervical exam deferred        Extremities: Normal range of motion.  Edema: None  Mental Status: Normal mood and affect. Normal behavior. Normal judgment and thought content.   Urinalysis: Urine Protein: Negative Urine Glucose: Negative  Assessment and Plan:  Pregnancy: G3P2001 at [redacted]w[redacted]d  1. Marginal placenta previa - Resolved  2. Supervision of other normal  pregnancy, antepartum, third trimester   3. Previous cesarean delivery, delivered - Will have Ucon at 39 weeks  Preterm labor symptoms and general obstetric precautions including but not limited to vaginal bleeding, contractions, leaking of fluid and fetal movement were reviewed in detail with the patient. Please refer to After Visit Summary for other counseling recommendations.  Return in about 2 weeks (around 10/06/2015).   Emily Filbert, MD

## 2015-09-24 ENCOUNTER — Other Ambulatory Visit (HOSPITAL_COMMUNITY): Payer: Self-pay | Admitting: *Deleted

## 2015-09-24 ENCOUNTER — Encounter (HOSPITAL_COMMUNITY): Payer: Self-pay | Admitting: *Deleted

## 2015-10-05 ENCOUNTER — Ambulatory Visit (INDEPENDENT_AMBULATORY_CARE_PROVIDER_SITE_OTHER): Payer: BC Managed Care – PPO | Admitting: Obstetrics & Gynecology

## 2015-10-05 VITALS — BP 128/73 | HR 93 | Wt 166.0 lb

## 2015-10-05 DIAGNOSIS — Z3483 Encounter for supervision of other normal pregnancy, third trimester: Secondary | ICD-10-CM

## 2015-10-05 NOTE — Progress Notes (Signed)
Subjective:  Danielle Sawyer is a 35 y.o. G3P2001 at [redacted]w[redacted]d being seen today for ongoing prenatal care.  She is currently monitored for the following issues for this high-risk pregnancy and has Pregnancy with early neonatal death; Supervision of other normal pregnancy, antepartum; Previous cesarean delivery, delivered; and Marginal placenta previa on her problem list.  Patient reports no complaints.  Contractions: Not present. Vag. Bleeding: None.  Movement: Present. Denies leaking of fluid.   The following portions of the patient's history were reviewed and updated as appropriate: allergies, current medications, past family history, past medical history, past social history, past surgical history and problem list. Problem list updated.  Objective:   Filed Vitals:   10/05/15 1600  BP: 128/73  Pulse: 93  Weight: 166 lb (75.297 kg)    Fetal Status: Fetal Heart Rate (bpm): 131 Fundal Height: 32 cm Movement: Present     General:  Alert, oriented and cooperative. Patient is in no acute distress.  Skin: Skin is warm and dry. No rash noted.   Cardiovascular: Normal heart rate noted  Respiratory: Normal respiratory effort, no problems with respiration noted  Abdomen: Soft, gravid, appropriate for gestational age. Pain/Pressure: Absent     Pelvic: Vag. Bleeding: None Vag D/C Character: Thin  Cervical exam deferred        Extremities: Normal range of motion.  Edema: None  Mental Status: Normal mood and affect. Normal behavior. Normal judgment and thought content.   Urinalysis: Urine Protein: Negative Urine Glucose: Negative  Assessment and Plan:  Pregnancy: G3P2001 at [redacted]w[redacted]d  Supervision of other normal pregnancy, antepartum, third trimester Scheduled for RCS at 39 weeks.  Next growth scan scheduled 10/21/15. Preterm labor symptoms and general obstetric precautions including but not limited to vaginal bleeding, contractions, leaking of fluid and fetal movement were reviewed in detail with the  patient. Please refer to After Visit Summary for other counseling recommendations.  Return in about 2 weeks (around 10/19/2015) for OB Visit, Pelvic cultures.   Osborne Oman, MD

## 2015-10-05 NOTE — Patient Instructions (Signed)
Return to clinic for any scheduled appointments or obstetric concerns, or go to [facility] for evaluation  

## 2015-10-20 ENCOUNTER — Ambulatory Visit (INDEPENDENT_AMBULATORY_CARE_PROVIDER_SITE_OTHER): Payer: BC Managed Care – PPO | Admitting: Family Medicine

## 2015-10-20 VITALS — BP 111/73 | HR 73 | Wt 169.0 lb

## 2015-10-20 DIAGNOSIS — Z36 Encounter for antenatal screening of mother: Secondary | ICD-10-CM | POA: Diagnosis not present

## 2015-10-20 DIAGNOSIS — Z3483 Encounter for supervision of other normal pregnancy, third trimester: Secondary | ICD-10-CM

## 2015-10-20 DIAGNOSIS — Z113 Encounter for screening for infections with a predominantly sexual mode of transmission: Secondary | ICD-10-CM

## 2015-10-20 DIAGNOSIS — O4420 Partial placenta previa NOS or without hemorrhage, unspecified trimester: Secondary | ICD-10-CM

## 2015-10-20 NOTE — Progress Notes (Signed)
Subjective:  Danielle Sawyer is a 35 y.o. G3P2001 at [redacted]w[redacted]d being seen today for ongoing prenatal care.  She is currently monitored for the following issues for this high-risk pregnancy and has Pregnancy with early neonatal death; Supervision of other normal pregnancy, antepartum; Previous cesarean delivery, delivered; and Marginal placenta previa on her problem list.  Patient reports no complaints.  Contractions: Not present. Vag. Bleeding: None.  Movement: Present. Denies leaking of fluid.   The following portions of the patient's history were reviewed and updated as appropriate: allergies, current medications, past family history, past medical history, past social history, past surgical history and problem list. Problem list updated.  Objective:   Filed Vitals:   10/20/15 1549  BP: 111/73  Pulse: 73  Weight: 169 lb (76.658 kg)    Fetal Status: Fetal Heart Rate (bpm): 130 Fundal Height: 34 cm Movement: Present     General:  Alert, oriented and cooperative. Patient is in no acute distress.  Skin: Skin is warm and dry. No rash noted.   Cardiovascular: Normal heart rate noted  Respiratory: Normal respiratory effort, no problems with respiration noted  Abdomen: Soft, gravid, appropriate for gestational age. Pain/Pressure: Absent     Pelvic: Vag. Bleeding: None Vag D/C Character: Thin   Cervical exam performed Dilation: 1 Effacement (%): Thick Station: -2  Extremities: Normal range of motion.  Edema: None  Mental Status: Normal mood and affect. Normal behavior. Normal judgment and thought content.     Assessment and Plan:  Pregnancy: G3P2001 at [redacted]w[redacted]d  1. Supervision of other normal pregnancy, antepartum, third trimester Continue prenatal care.  - GC/Chlamydia probe amp ([place])not at [facility] - Culture, beta strep (group b only)  2. Marginal placenta previa resolved  Term labor symptoms and general obstetric precautions including but not limited to vaginal bleeding,  contractions, leaking of fluid and fetal movement were reviewed in detail with the patient. Please refer to After Visit Summary for other counseling recommendations.  Return in 1 week (on 10/27/2015).   Donnamae Jude, MD

## 2015-10-20 NOTE — Patient Instructions (Signed)
Third Trimester of Pregnancy The third trimester is from week 29 through week 42, months 7 through 9. The third trimester is a time when the fetus is growing rapidly. At the end of the ninth month, the fetus is about 20 inches in length and weighs 6-10 pounds.  BODY CHANGES Your body goes through many changes during pregnancy. The changes vary from woman to woman.   Your weight will continue to increase. You can expect to gain 25-35 pounds (11-16 kg) by the end of the pregnancy.  You may begin to get stretch marks on your hips, abdomen, and breasts.  You may urinate more often because the fetus is moving lower into your pelvis and pressing on your bladder.  You may develop or continue to have heartburn as a result of your pregnancy.  You may develop constipation because certain hormones are causing the muscles that push waste through your intestines to slow down.  You may develop hemorrhoids or swollen, bulging veins (varicose veins).  You may have pelvic pain because of the weight gain and pregnancy hormones relaxing your joints between the bones in your pelvis. Backaches may result from overexertion of the muscles supporting your posture.  You may have changes in your hair. These can include thickening of your hair, rapid growth, and changes in texture. Some women also have hair loss during or after pregnancy, or hair that feels dry or thin. Your hair will most likely return to normal after your baby is born.  Your breasts will continue to grow and be tender. A yellow discharge may leak from your breasts called colostrum.  Your belly button may stick out.  You may feel short of breath because of your expanding uterus.  You may notice the fetus "dropping," or moving lower in your abdomen.  You may have a bloody mucus discharge. This usually occurs a few days to a week before labor begins.  Your cervix becomes thin and soft (effaced) near your due date. WHAT TO EXPECT AT YOUR  PRENATAL EXAMS  You will have prenatal exams every 2 weeks until week 36. Then, you will have weekly prenatal exams. During a routine prenatal visit:  You will be weighed to make sure you and the fetus are growing normally.  Your blood pressure is taken.  Your abdomen will be measured to track your baby's growth.  The fetal heartbeat will be listened to.  Any test results from the previous visit will be discussed.  You may have a cervical check near your due date to see if you have effaced. At around 36 weeks, your caregiver will check your cervix. At the same time, your caregiver will also perform a test on the secretions of the vaginal tissue. This test is to determine if a type of bacteria, Group B streptococcus, is present. Your caregiver will explain this further. Your caregiver may ask you:  What your birth plan is.  How you are feeling.  If you are feeling the baby move.  If you have had any abnormal symptoms, such as leaking fluid, bleeding, severe headaches, or abdominal cramping.  If you are using any tobacco products, including cigarettes, chewing tobacco, and electronic cigarettes.  If you have any questions. Other tests or screenings that may be performed during your third trimester include:  Blood tests that check for low iron levels (anemia).  Fetal testing to check the health, activity level, and growth of the fetus. Testing is done if you have certain medical conditions or if   there are problems during the pregnancy.  HIV (human immunodeficiency virus) testing. If you are at high risk, you may be screened for HIV during your third trimester of pregnancy. FALSE LABOR You may feel small, irregular contractions that eventually go away. These are called Braxton Hicks contractions, or false labor. Contractions may last for hours, days, or even weeks before true labor sets in. If contractions come at regular intervals, intensify, or become painful, it is best to be seen  by your caregiver.  SIGNS OF LABOR   Menstrual-like cramps.  Contractions that are 5 minutes apart or less.  Contractions that start on the top of the uterus and spread down to the lower abdomen and back.  A sense of increased pelvic pressure or back pain.  A watery or bloody mucus discharge that comes from the vagina. If you have any of these signs before the 37th week of pregnancy, call your caregiver right away. You need to go to the hospital to get checked immediately. HOME CARE INSTRUCTIONS   Avoid all smoking, herbs, alcohol, and unprescribed drugs. These chemicals affect the formation and growth of the baby.  Do not use any tobacco products, including cigarettes, chewing tobacco, and electronic cigarettes. If you need help quitting, ask your health care provider. You may receive counseling support and other resources to help you quit.  Follow your caregiver's instructions regarding medicine use. There are medicines that are either safe or unsafe to take during pregnancy.  Exercise only as directed by your caregiver. Experiencing uterine cramps is a good sign to stop exercising.  Continue to eat regular, healthy meals.  Wear a good support bra for breast tenderness.  Do not use hot tubs, steam rooms, or saunas.  Wear your seat belt at all times when driving.  Avoid raw meat, uncooked cheese, cat litter boxes, and soil used by cats. These carry germs that can cause birth defects in the baby.  Take your prenatal vitamins.  Take 1500-2000 mg of calcium daily starting at the 20th week of pregnancy until you deliver your baby.  Try taking a stool softener (if your caregiver approves) if you develop constipation. Eat more high-fiber foods, such as fresh vegetables or fruit and whole grains. Drink plenty of fluids to keep your urine clear or pale yellow.  Take warm sitz baths to soothe any pain or discomfort caused by hemorrhoids. Use hemorrhoid cream if your caregiver  approves.  If you develop varicose veins, wear support hose. Elevate your feet for 15 minutes, 3-4 times a day. Limit salt in your diet.  Avoid heavy lifting, wear low heal shoes, and practice good posture.  Rest a lot with your legs elevated if you have leg cramps or low back pain.  Visit your dentist if you have not gone during your pregnancy. Use a soft toothbrush to brush your teeth and be gentle when you floss.  A sexual relationship may be continued unless your caregiver directs you otherwise.  Do not travel far distances unless it is absolutely necessary and only with the approval of your caregiver.  Take prenatal classes to understand, practice, and ask questions about the labor and delivery.  Make a trial run to the hospital.  Pack your hospital bag.  Prepare the baby's nursery.  Continue to go to all your prenatal visits as directed by your caregiver. SEEK MEDICAL CARE IF:  You are unsure if you are in labor or if your water has broken.  You have dizziness.  You have   mild pelvic cramps, pelvic pressure, or nagging pain in your abdominal area.  You have persistent nausea, vomiting, or diarrhea.  You have a bad smelling vaginal discharge.  You have pain with urination. SEEK IMMEDIATE MEDICAL CARE IF:   You have a fever.  You are leaking fluid from your vagina.  You have spotting or bleeding from your vagina.  You have severe abdominal cramping or pain.  You have rapid weight loss or gain.  You have shortness of breath with chest pain.  You notice sudden or extreme swelling of your face, hands, ankles, feet, or legs.  You have not felt your baby move in over an hour.  You have severe headaches that do not go away with medicine.  You have vision changes.   This information is not intended to replace advice given to you by your health care provider. Make sure you discuss any questions you have with your health care provider.   Document Released:  05/03/2001 Document Revised: 05/30/2014 Document Reviewed: 07/10/2012 Elsevier Interactive Patient Education 2016 [employer]  Breastfeeding Deciding to breastfeed is one of the best choices you can make for you and your baby. A change in hormones during pregnancy causes your breast tissue to grow and increases the number and size of your milk ducts. These hormones also allow proteins, sugars, and fats from your blood supply to make breast milk in your milk-producing glands. Hormones prevent breast milk from being released before your baby is born as well as prompt milk flow after birth. Once breastfeeding has begun, thoughts of your baby, as well as his or her sucking or crying, can stimulate the release of milk from your milk-producing glands.  BENEFITS OF BREASTFEEDING For Your Baby  Your first milk (colostrum) helps your baby's digestive system function better.  There are antibodies in your milk that help your baby fight off infections.  Your baby has a lower incidence of asthma, allergies, and sudden infant death syndrome.  The nutrients in breast milk are better for your baby than infant formulas and are designed uniquely for your baby's needs.  Breast milk improves your baby's brain development.  Your baby is less likely to develop other conditions, such as childhood obesity, asthma, or type 2 diabetes mellitus. For You  Breastfeeding helps to create a very special bond between you and your baby.  Breastfeeding is convenient. Breast milk is always available at the correct temperature and costs nothing.  Breastfeeding helps to burn calories and helps you lose the weight gained during pregnancy.  Breastfeeding makes your uterus contract to its prepregnancy size faster and slows bleeding (lochia) after you give birth.   Breastfeeding helps to lower your risk of developing type 2 diabetes mellitus, osteoporosis, and breast or ovarian cancer later in life. SIGNS THAT YOUR BABY IS  HUNGRY Early Signs of Hunger  Increased alertness or activity.  Stretching.  Movement of the head from side to side.  Movement of the head and opening of the mouth when the corner of the mouth or cheek is stroked (rooting).  Increased sucking sounds, smacking lips, cooing, sighing, or squeaking.  Hand-to-mouth movements.  Increased sucking of fingers or hands. Late Signs of Hunger  Fussing.  Intermittent crying. Extreme Signs of Hunger Signs of extreme hunger will require calming and consoling before your baby will be able to breastfeed successfully. Do not wait for the following signs of extreme hunger to occur before you initiate breastfeeding:  Restlessness.  A loud, strong cry.  Screaming.   BREASTFEEDING BASICS Breastfeeding Initiation  Find a comfortable place to sit or lie down, with your neck and back well supported.  Place a pillow or rolled up blanket under your baby to bring him or her to the level of your breast (if you are seated). Nursing pillows are specially designed to help support your arms and your baby while you breastfeed.  Make sure that your baby's abdomen is facing your abdomen.  Gently massage your breast. With your fingertips, massage from your chest wall toward your nipple in a circular motion. This encourages milk flow. You may need to continue this action during the feeding if your milk flows slowly.  Support your breast with 4 fingers underneath and your thumb above your nipple. Make sure your fingers are well away from your nipple and your baby's mouth.  Stroke your baby's lips gently with your finger or nipple.  When your baby's mouth is open wide enough, quickly bring your baby to your breast, placing your entire nipple and as much of the colored area around your nipple (areola) as possible into your baby's mouth.  More areola should be visible above your baby's upper lip than below the lower lip.  Your baby's tongue should be between his  or her lower gum and your breast.  Ensure that your baby's mouth is correctly positioned around your nipple (latched). Your baby's lips should create a seal on your breast and be turned out (everted).  It is common for your baby to suck about 2-3 minutes in order to start the flow of breast milk. Latching Teaching your baby how to latch on to your breast properly is very important. An improper latch can cause nipple pain and decreased milk supply for you and poor weight gain in your baby. Also, if your baby is not latched onto your nipple properly, he or she may swallow some air during feeding. This can make your baby fussy. Burping your baby when you switch breasts during the feeding can help to get rid of the air. However, teaching your baby to latch on properly is still the best way to prevent fussiness from swallowing air while breastfeeding. Signs that your baby has successfully latched on to your nipple:  Silent tugging or silent sucking, without causing you pain.  Swallowing heard between every 3-4 sucks.  Muscle movement above and in front of his or her ears while sucking. Signs that your baby has not successfully latched on to nipple:  Sucking sounds or smacking sounds from your baby while breastfeeding.  Nipple pain. If you think your baby has not latched on correctly, slip your finger into the corner of your baby's mouth to break the suction and place it between your baby's gums. Attempt breastfeeding initiation again. Signs of Successful Breastfeeding Signs from your baby:  A gradual decrease in the number of sucks or complete cessation of sucking.  Falling asleep.  Relaxation of his or her body.  Retention of a small amount of milk in his or her mouth.  Letting go of your breast by himself or herself. Signs from you:  Breasts that have increased in firmness, weight, and size 1-3 hours after feeding.  Breasts that are softer immediately after  breastfeeding.  Increased milk volume, as well as a change in milk consistency and color by the fifth day of breastfeeding.  Nipples that are not sore, cracked, or bleeding. Signs That Your Baby is Getting Enough Milk  Wetting at least 3 diapers in a 24-hour period.   The urine should be clear and pale yellow by age 5 days.  At least 3 stools in a 24-hour period by age 5 days. The stool should be soft and yellow.  At least 3 stools in a 24-hour period by age 7 days. The stool should be seedy and yellow.  No loss of weight greater than 10% of birth weight during the first 3 days of age.  Average weight gain of 4-7 ounces (113-198 g) per week after age 4 days.  Consistent daily weight gain by age 5 days, without weight loss after the age of 2 weeks. After a feeding, your baby may spit up a small amount. This is common. BREASTFEEDING FREQUENCY AND DURATION Frequent feeding will help you make more milk and can prevent sore nipples and breast engorgement. Breastfeed when you feel the need to reduce the fullness of your breasts or when your baby shows signs of hunger. This is called "breastfeeding on demand." Avoid introducing a pacifier to your baby while you are working to establish breastfeeding (the first 4-6 weeks after your baby is born). After this time you may choose to use a pacifier. Research has shown that pacifier use during the first year of a baby's life decreases the risk of sudden infant death syndrome (SIDS). Allow your baby to feed on each breast as long as he or she wants. Breastfeed until your baby is finished feeding. When your baby unlatches or falls asleep while feeding from the first breast, offer the second breast. Because newborns are often sleepy in the first few weeks of life, you may need to awaken your baby to get him or her to feed. Breastfeeding times will vary from baby to baby. However, the following rules can serve as a guide to help you ensure that your baby is  properly fed:  Newborns (babies 4 weeks of age or younger) may breastfeed every 1-3 hours.  Newborns should not go longer than 3 hours during the day or 5 hours during the night without breastfeeding.  You should breastfeed your baby a minimum of 8 times in a 24-hour period until you begin to introduce solid foods to your baby at around 6 months of age. BREAST MILK PUMPING Pumping and storing breast milk allows you to ensure that your baby is exclusively fed your breast milk, even at times when you are unable to breastfeed. This is especially important if you are going back to work while you are still breastfeeding or when you are not able to be present during feedings. Your lactation consultant can give you guidelines on how long it is safe to store breast milk. A breast pump is a machine that allows you to pump milk from your breast into a sterile bottle. The pumped breast milk can then be stored in a refrigerator or freezer. Some breast pumps are operated by hand, while others use electricity. Ask your lactation consultant which type will work best for you. Breast pumps can be purchased, but some hospitals and breastfeeding support groups lease breast pumps on a monthly basis. A lactation consultant can teach you how to hand express breast milk, if you prefer not to use a pump. CARING FOR YOUR BREASTS WHILE YOU BREASTFEED Nipples can become dry, cracked, and sore while breastfeeding. The following recommendations can help keep your breasts moisturized and healthy:  Avoid using soap on your nipples.  Wear a supportive bra. Although not required, special nursing bras and tank tops are designed to allow access to your   breasts for breastfeeding without taking off your entire bra or top. Avoid wearing underwire-style bras or extremely tight bras.  Air dry your nipples for 3-4minutes after each feeding.  Use only cotton bra pads to absorb leaked breast milk. Leaking of breast milk between feedings  is normal.  Use lanolin on your nipples after breastfeeding. Lanolin helps to maintain your skin's normal moisture barrier. If you use pure lanolin, you do not need to wash it off before feeding your baby again. Pure lanolin is not toxic to your baby. You may also hand express a few drops of breast milk and gently massage that milk into your nipples and allow the milk to air dry. In the first few weeks after giving birth, some women experience extremely full breasts (engorgement). Engorgement can make your breasts feel heavy, warm, and tender to the touch. Engorgement peaks within 3-5 days after you give birth. The following recommendations can help ease engorgement:  Completely empty your breasts while breastfeeding or pumping. You may want to start by applying warm, moist heat (in the shower or with warm water-soaked hand towels) just before feeding or pumping. This increases circulation and helps the milk flow. If your baby does not completely empty your breasts while breastfeeding, pump any extra milk after he or she is finished.  Wear a snug bra (nursing or regular) or tank top for 1-2 days to signal your body to slightly decrease milk production.  Apply ice packs to your breasts, unless this is too uncomfortable for you.  Make sure that your baby is latched on and positioned properly while breastfeeding. If engorgement persists after 48 hours of following these recommendations, contact your health care provider or a lactation consultant. OVERALL HEALTH CARE RECOMMENDATIONS WHILE BREASTFEEDING  Eat healthy foods. Alternate between meals and snacks, eating 3 of each per day. Because what you eat affects your breast milk, some of the foods may make your baby more irritable than usual. Avoid eating these foods if you are sure that they are negatively affecting your baby.  Drink milk, fruit juice, and water to satisfy your thirst (about 10 glasses a day).  Rest often, relax, and continue to take  your prenatal vitamins to prevent fatigue, stress, and anemia.  Continue breast self-awareness checks.  Avoid chewing and smoking tobacco. Chemicals from cigarettes that pass into breast milk and exposure to secondhand smoke may harm your baby.  Avoid alcohol and drug use, including marijuana. Some medicines that may be harmful to your baby can pass through breast milk. It is important to ask your health care provider before taking any medicine, including all over-the-counter and prescription medicine as well as vitamin and herbal supplements. It is possible to become pregnant while breastfeeding. If birth control is desired, ask your health care provider about options that will be safe for your baby. SEEK MEDICAL CARE IF:  You feel like you want to stop breastfeeding or have become frustrated with breastfeeding.  You have painful breasts or nipples.  Your nipples are cracked or bleeding.  Your breasts are red, tender, or warm.  You have a swollen area on either breast.  You have a fever or chills.  You have nausea or vomiting.  You have drainage other than breast milk from your nipples.  Your breasts do not become full before feedings by the fifth day after you give birth.  You feel sad and depressed.  Your baby is too sleepy to eat well.  Your baby is having trouble sleeping.     Your baby is wetting less than 3 diapers in a 24-hour period.  Your baby has less than 3 stools in a 24-hour period.  Your baby's skin or the white part of his or her eyes becomes yellow.   Your baby is not gaining weight by 5 days of age. SEEK IMMEDIATE MEDICAL CARE IF:  Your baby is overly tired (lethargic) and does not want to wake up and feed.  Your baby develops an unexplained fever.   This information is not intended to replace advice given to you by your health care provider. Make sure you discuss any questions you have with your health care provider.   Document Released: 05/09/2005  Document Revised: 01/28/2015 Document Reviewed: 10/31/2012 Elsevier Interactive Patient Education 2016 [employer]  

## 2015-10-21 ENCOUNTER — Ambulatory Visit (HOSPITAL_COMMUNITY)
Admission: RE | Admit: 2015-10-21 | Discharge: 2015-10-21 | Disposition: A | Discharge status: Home / Self Care | Payer: BC Managed Care – PPO | Source: Ambulatory Visit | Attending: Family Medicine | Admitting: Family Medicine

## 2015-10-21 ENCOUNTER — Encounter (HOSPITAL_COMMUNITY): Payer: Self-pay

## 2015-10-21 DIAGNOSIS — Z3A36 36 weeks gestation of pregnancy: Secondary | ICD-10-CM | POA: Diagnosis not present

## 2015-10-21 DIAGNOSIS — O09523 Supervision of elderly multigravida, third trimester: Secondary | ICD-10-CM | POA: Insufficient documentation

## 2015-10-21 LAB — CULTURE, BETA STREP (GROUP B ONLY)

## 2015-10-22 ENCOUNTER — Telehealth: Payer: Self-pay | Admitting: *Deleted

## 2015-10-22 ENCOUNTER — Encounter: Payer: Self-pay | Admitting: Family Medicine

## 2015-10-22 DIAGNOSIS — O99820 Streptococcus B carrier state complicating pregnancy: Secondary | ICD-10-CM | POA: Insufficient documentation

## 2015-10-22 LAB — GC/CHLAMYDIA PROBE AMP (~~LOC~~) NOT AT ARMC
Chlamydia: NEGATIVE
NEISSERIA GONORRHEA: NEGATIVE

## 2015-10-22 NOTE — Telephone Encounter (Signed)
Dr Burnett Harry called in regards to pt last [place], baby was measuring less than dates and wanted to discuss pt EDD.  Pt EDD has been changed several times throughout the pregnancy.  Informed Dr Burnett Harry that bedside US was performed at initial OB visit and baby measured [redacted]w[redacted]d at that time which correlated with pt LMP.  Dr Burnett Harry recommended [place] weekly for the next two weeks for growth until scheduled C/S on 11-08-15. MFM will set up next [place] appt.

## 2015-10-23 ENCOUNTER — Telehealth (HOSPITAL_COMMUNITY): Payer: Self-pay | Admitting: *Deleted

## 2015-10-23 ENCOUNTER — Other Ambulatory Visit (HOSPITAL_COMMUNITY): Payer: Self-pay

## 2015-10-23 NOTE — Telephone Encounter (Signed)
Preadmission screen  

## 2015-10-26 ENCOUNTER — Ambulatory Visit (INDEPENDENT_AMBULATORY_CARE_PROVIDER_SITE_OTHER): Payer: BC Managed Care – PPO | Admitting: Obstetrics & Gynecology

## 2015-10-26 ENCOUNTER — Encounter (HOSPITAL_COMMUNITY): Payer: Self-pay

## 2015-10-26 ENCOUNTER — Encounter: Payer: Self-pay | Admitting: Obstetrics & Gynecology

## 2015-10-26 VITALS — BP 138/81 | HR 90 | Wt 172.0 lb

## 2015-10-26 DIAGNOSIS — O365990 Maternal care for other known or suspected poor fetal growth, unspecified trimester, not applicable or unspecified: Secondary | ICD-10-CM | POA: Insufficient documentation

## 2015-10-26 DIAGNOSIS — O365931 Maternal care for other known or suspected poor fetal growth, third trimester, fetus 1: Secondary | ICD-10-CM

## 2015-10-26 DIAGNOSIS — Z3483 Encounter for supervision of other normal pregnancy, third trimester: Secondary | ICD-10-CM

## 2015-10-26 NOTE — Progress Notes (Signed)
Subjective:  Danielle Sawyer is a 35 y.o. G3P2001 at [redacted]w[redacted]d being seen today for ongoing prenatal care.  She is currently monitored for the following issues for this high-risk pregnancy and has Pregnancy with early neonatal death; Supervision of other normal pregnancy, antepartum; Previous cesarean delivery, delivered; Marginal placenta previa; Group B Streptococcus carrier, +RV culture, currently pregnant; and Symmetric IUGR complicating pregnancy, antepartum on her problem list.  Patient reports no complaints.  Contractions: Not present. Vag. Bleeding: None.  Movement: Present. Denies leaking of fluid.   The following portions of the patient's history were reviewed and updated as appropriate: allergies, current medications, past family history, past medical history, past social history, past surgical history and problem list. Problem list updated.  Objective:   Filed Vitals:   10/26/15 1310  BP: 138/81  Pulse: 90  Weight: 172 lb (78.019 kg)    Fetal Status: Fetal Heart Rate (bpm): 125 Fundal Height: 34 cm Movement: Present     General:  Alert, oriented and cooperative. Patient is in no acute distress.  Skin: Skin is warm and dry. No rash noted.   Cardiovascular: Normal heart rate noted  Respiratory: Normal respiratory effort, no problems with respiration noted  Abdomen: Soft, gravid, appropriate for gestational age. Pain/Pressure: Absent     Pelvic: Vag. Bleeding: None Vag D/C Character: Thin  Cervical exam deferred        Extremities: Normal range of motion.  Edema: None  Mental Status: Normal mood and affect. Normal behavior. Normal judgment and thought content.   Urinalysis: Urine Protein: Trace Urine Glucose: Negative 10/22/15 [redacted]w[redacted]d EFW 2131g (4 lb 11 oz <10%), AC 9%, nml fluid and UA dopplers >  Assessment and Plan:  Pregnancy: G3P2001 at [redacted]w[redacted]d  1. Symmetric IUGR complicating pregnancy, antepartum, third trimester, fetus 1 MFM to do weekly dopplers and BPP, delivery at 39 weeks  as scheduled.  2. Supervision of other normal pregnancy, antepartum, third trimester Monitor BP closely. Term labor symptoms and general obstetric precautions including but not limited to vaginal bleeding, contractions, leaking of fluid and fetal movement were reviewed in detail with the patient. Please refer to After Visit Summary for other counseling recommendations.  Return in about 1 week (around 11/02/2015) for OB Visit.   Osborne Oman, MD

## 2015-10-26 NOTE — Patient Instructions (Signed)
Return to clinic for any scheduled appointments or obstetric concerns, or go to [facility] for evaluation  

## 2015-10-28 ENCOUNTER — Encounter (HOSPITAL_COMMUNITY): Payer: Self-pay

## 2015-10-28 ENCOUNTER — Other Ambulatory Visit (HOSPITAL_COMMUNITY): Payer: Self-pay | Admitting: Maternal and Fetal Medicine

## 2015-10-28 ENCOUNTER — Ambulatory Visit (HOSPITAL_COMMUNITY)
Admission: RE | Admit: 2015-10-28 | Discharge: 2015-10-28 | Disposition: A | Discharge status: Home / Self Care | Payer: BC Managed Care – PPO | Source: Ambulatory Visit | Attending: Family Medicine | Admitting: Family Medicine

## 2015-10-28 DIAGNOSIS — O09293 Supervision of pregnancy with other poor reproductive or obstetric history, third trimester: Secondary | ICD-10-CM

## 2015-10-28 DIAGNOSIS — O09523 Supervision of elderly multigravida, third trimester: Secondary | ICD-10-CM | POA: Insufficient documentation

## 2015-10-28 DIAGNOSIS — O34219 Maternal care for unspecified type scar from previous cesarean delivery: Secondary | ICD-10-CM | POA: Insufficient documentation

## 2015-10-28 DIAGNOSIS — O365993 Maternal care for other known or suspected poor fetal growth, unspecified trimester, fetus 3: Secondary | ICD-10-CM | POA: Diagnosis not present

## 2015-10-28 DIAGNOSIS — Z3A37 37 weeks gestation of pregnancy: Secondary | ICD-10-CM | POA: Diagnosis not present

## 2015-11-02 ENCOUNTER — Ambulatory Visit (INDEPENDENT_AMBULATORY_CARE_PROVIDER_SITE_OTHER): Payer: BC Managed Care – PPO | Admitting: Obstetrics & Gynecology

## 2015-11-02 VITALS — BP 123/76 | HR 84 | Wt 173.0 lb

## 2015-11-02 DIAGNOSIS — Z3483 Encounter for supervision of other normal pregnancy, third trimester: Secondary | ICD-10-CM

## 2015-11-02 NOTE — Progress Notes (Signed)
Subjective:  Danielle Sawyer is a 35 y.o. M AA  G3P2001 at [redacted]w[redacted]d being seen today for ongoing prenatal care.  She is currently monitored for the following issues for this low-risk pregnancy and has Pregnancy with early neonatal death; Supervision of other normal pregnancy, antepartum; Previous cesarean delivery, delivered; Marginal placenta previa; Group B Streptococcus carrier, +RV culture, currently pregnant; and Symmetric IUGR complicating pregnancy, antepartum on her problem list.  Patient reports no complaints.  Contractions: Not present. Vag. Bleeding: None.  Movement: Present. Denies leaking of fluid.   The following portions of the patient's history were reviewed and updated as appropriate: allergies, current medications, past family history, past medical history, past social history, past surgical history and problem list. Problem list updated.  Objective:   Filed Vitals:   11/02/15 1401  BP: 123/76  Pulse: 84  Weight: 173 lb (78.472 kg)    Fetal Status: Fetal Heart Rate (bpm): 144   Movement: Present     General:  Alert, oriented and cooperative. Patient is in no acute distress.  Skin: Skin is warm and dry. No rash noted.   Cardiovascular: Normal heart rate noted  Respiratory: Normal respiratory effort, no problems with respiration noted  Abdomen: Soft, gravid, appropriate for gestational age. Pain/Pressure: Absent     Pelvic: Cervical exam deferred        Extremities: Normal range of motion.  Edema: None  Mental Status: Normal mood and affect. Normal behavior. Normal judgment and thought content.   Urinalysis: Urine Protein: Trace Urine Glucose: Negative  Assessment and Plan:  Pregnancy: G3P2001 at [redacted]w[redacted]d  There are no diagnoses linked to this encounter. Term labor symptoms and general obstetric precautions including but not limited to vaginal bleeding, contractions, leaking of fluid and fetal movement were reviewed in detail with the patient. Please refer to After Visit  Summary for other counseling recommendations.  No Follow-up on file.   Emily Filbert, MD

## 2015-11-04 ENCOUNTER — Ambulatory Visit (HOSPITAL_COMMUNITY)
Admission: RE | Admit: 2015-11-04 | Discharge: 2015-11-04 | Disposition: A | Discharge status: Home / Self Care | Payer: BC Managed Care – PPO | Source: Ambulatory Visit | Attending: Family Medicine | Admitting: Family Medicine

## 2015-11-04 ENCOUNTER — Ambulatory Visit (HOSPITAL_COMMUNITY): Payer: BC Managed Care – PPO

## 2015-11-04 ENCOUNTER — Encounter (HOSPITAL_COMMUNITY): Payer: Self-pay

## 2015-11-04 DIAGNOSIS — Z3A38 38 weeks gestation of pregnancy: Secondary | ICD-10-CM | POA: Insufficient documentation

## 2015-11-04 DIAGNOSIS — O365990 Maternal care for other known or suspected poor fetal growth, unspecified trimester, not applicable or unspecified: Secondary | ICD-10-CM | POA: Insufficient documentation

## 2015-11-04 DIAGNOSIS — O09523 Supervision of elderly multigravida, third trimester: Secondary | ICD-10-CM | POA: Diagnosis not present

## 2015-11-04 DIAGNOSIS — O09293 Supervision of pregnancy with other poor reproductive or obstetric history, third trimester: Secondary | ICD-10-CM | POA: Diagnosis not present

## 2015-11-05 NOTE — Anesthesia Preprocedure Evaluation (Addendum)
Anesthesia Evaluation  Patient identified by MRN, date of birth, ID band Patient awake    Reviewed: Allergy & Precautions, NPO status , Patient's Chart, lab work & pertinent test results  Airway Mallampati: II  TM Distance: >3 FB Neck ROM: Full    Dental  (+) Teeth Intact, Dental Advisory Given   Pulmonary neg pulmonary ROS,    breath sounds clear to auscultation       Cardiovascular negative cardio ROS   Rhythm:Regular Rate:Normal     Neuro/Psych negative neurological ROS  negative psych ROS   GI/Hepatic negative GI ROS, Neg liver ROS,   Endo/Other  negative endocrine ROS  Renal/GU negative Renal ROS  negative genitourinary   Musculoskeletal negative musculoskeletal ROS (+)   Abdominal   Peds negative pediatric ROS (+)  Hematology negative hematology ROS (+)   Anesthesia Other Findings   Reproductive/Obstetrics (+) Pregnancy                            Lab Results  Component Value Date   WBC 11.5* 08/21/2015   HGB 11.4* 08/21/2015   HCT 34.1* 08/21/2015   MCV 95.3 08/21/2015   PLT 228 08/21/2015   Lab Results  Component Value Date   INR 1.09 10/09/2014     Anesthesia Physical Anesthesia Plan  ASA: II  Anesthesia Plan: Spinal   Post-op Pain Management:    Induction:   Airway Management Planned:   Additional Equipment:   Intra-op Plan:   Post-operative Plan:   Informed Consent: I have reviewed the patients History and Physical, chart, labs and discussed the procedure including the risks, benefits and alternatives for the proposed anesthesia with the patient or authorized representative who has indicated his/her understanding and acceptance.     Plan Discussed with: CRNA  Anesthesia Plan Comments:         Anesthesia Quick Evaluation

## 2015-11-06 ENCOUNTER — Encounter (HOSPITAL_COMMUNITY): Payer: Self-pay

## 2015-11-06 ENCOUNTER — Encounter (HOSPITAL_COMMUNITY)
Admission: RE | Admit: 2015-11-06 | Discharge: 2015-11-06 | Disposition: A | Discharge status: Home / Self Care | Payer: BC Managed Care – PPO | Source: Ambulatory Visit | Attending: Family Medicine | Admitting: Family Medicine

## 2015-11-06 LAB — CBC
HCT: 35.2 % — ABNORMAL LOW (ref 36.0–46.0)
Hemoglobin: 11.9 g/dL — ABNORMAL LOW (ref 12.0–15.0)
MCH: 31.7 pg (ref 26.0–34.0)
MCHC: 33.8 g/dL (ref 30.0–36.0)
MCV: 93.9 fL (ref 78.0–100.0)
PLATELETS: 214 10*3/uL (ref 150–400)
RBC: 3.75 MIL/uL — ABNORMAL LOW (ref 3.87–5.11)
RDW: 14 % (ref 11.5–15.5)
WBC: 12.2 10*3/uL — ABNORMAL HIGH (ref 4.0–10.5)

## 2015-11-06 LAB — TYPE AND SCREEN
ABO/RH(D): A POS
Antibody Screen: NEGATIVE

## 2015-11-06 NOTE — Patient Instructions (Signed)
[place]  [id number]   Your procedure is scheduled on:  11/08/2015  Enter through the Maternity Admissions at 1000 AM.  Please tell them you are here for a Cesarean Section.  Call this number if you have problems the morning of surgery: [phone]   Remember:   Do not eat food:After Midnight.  Do not drink clear liquids: 4 Hours before arrival.  Take these medicines the morning of surgery with A SIP OF WATER: none   Do not wear jewelry, make-up or nail polish.  Do not wear lotions, powders, or perfumes. You may wear deodorant.  Do not shave 48 hours prior to surgery.  Do not bring valuables to the hospital.  [facility] is not   responsible for any belongings or valuables brought to the hospital.  Contacts, dentures or bridgework may not be worn into surgery.  Leave suitcase in the car. After surgery it may be brought to your room.  For patients admitted to the hospital, checkout time is 11:00 AM the day of              discharge.   Patients discharged the day of surgery will not be allowed to drive             home.  Name and phone number of your driver: na  Special Instructions:   Shower using CHG 2 nights before surgery and the night before surgery.  If you shower the day of surgery use CHG.  Use special wash - you have one bottle of CHG for all showers.  You should use approximately 1/3 of the bottle for each shower.   Please read over the following fact sheets that you were given:   Surgical Site Infection Prevention

## 2015-11-07 LAB — RPR: RPR Ser Ql: NONREACTIVE

## 2015-11-08 ENCOUNTER — Telehealth: Payer: Self-pay | Admitting: Obstetrics and Gynecology

## 2015-11-08 ENCOUNTER — Encounter (HOSPITAL_COMMUNITY): Payer: Self-pay | Admitting: *Deleted

## 2015-11-08 ENCOUNTER — Inpatient Hospital Stay (HOSPITAL_COMMUNITY): Payer: BC Managed Care – PPO | Admitting: Anesthesiology

## 2015-11-08 ENCOUNTER — Encounter (HOSPITAL_COMMUNITY)
Admission: RE | Disposition: A | Discharge status: Home / Self Care | Payer: Self-pay | Source: Ambulatory Visit | Attending: Obstetrics and Gynecology

## 2015-11-08 ENCOUNTER — Inpatient Hospital Stay (HOSPITAL_COMMUNITY)
Admission: RE | Admit: 2015-11-08 | Discharge: 2015-11-11 | DRG: 766 | Disposition: A | Discharge status: Home / Self Care | Payer: BC Managed Care – PPO | Source: Ambulatory Visit | Attending: Obstetrics and Gynecology | Admitting: Obstetrics and Gynecology

## 2015-11-08 DIAGNOSIS — O34219 Maternal care for unspecified type scar from previous cesarean delivery: Secondary | ICD-10-CM

## 2015-11-08 DIAGNOSIS — O4420 Partial placenta previa NOS or without hemorrhage, unspecified trimester: Secondary | ICD-10-CM

## 2015-11-08 DIAGNOSIS — O99824 Streptococcus B carrier state complicating childbirth: Secondary | ICD-10-CM | POA: Diagnosis present

## 2015-11-08 DIAGNOSIS — N858 Other specified noninflammatory disorders of uterus: Secondary | ICD-10-CM

## 2015-11-08 DIAGNOSIS — Z3A39 39 weeks gestation of pregnancy: Secondary | ICD-10-CM

## 2015-11-08 DIAGNOSIS — Z98891 History of uterine scar from previous surgery: Secondary | ICD-10-CM

## 2015-11-08 DIAGNOSIS — O99820 Streptococcus B carrier state complicating pregnancy: Secondary | ICD-10-CM

## 2015-11-08 DIAGNOSIS — O34211 Maternal care for low transverse scar from previous cesarean delivery: Secondary | ICD-10-CM | POA: Diagnosis not present

## 2015-11-08 DIAGNOSIS — Z8249 Family history of ischemic heart disease and other diseases of the circulatory system: Secondary | ICD-10-CM | POA: Diagnosis not present

## 2015-11-08 SURGERY — Surgical Case
Anesthesia: Spinal | Site: Abdomen

## 2015-11-08 MED ORDER — DIPHENHYDRAMINE HCL 50 MG/ML IJ SOLN: 12.5000 mg | INTRAMUSCULAR | Status: DC | PRN

## 2015-11-08 MED ORDER — PHENYLEPHRINE 8 MG IN D5W 100 ML (0.08MG/ML) PREMIX OPTIME
INJECTION | INTRAVENOUS | Status: DC | PRN
  Administered 2015-11-08: 60 ug/min via INTRAVENOUS

## 2015-11-08 MED ORDER — FENTANYL CITRATE (PF) 250 MCG/5ML IJ SOLN
INTRAMUSCULAR | Status: AC
  Filled 2015-11-08: qty 5

## 2015-11-08 MED ORDER — ZOLPIDEM TARTRATE 5 MG PO TABS: 5.0000 mg | ORAL_TABLET | Freq: Every evening | ORAL | Status: DC | PRN

## 2015-11-08 MED ORDER — SIMETHICONE 80 MG PO CHEW
80.0000 mg | CHEWABLE_TABLET | ORAL | Status: DC
Start: 2015-11-09 — End: 2015-11-11
  Administered 2015-11-10 – 2015-11-11 (×2): 80 mg via ORAL
  Filled 2015-11-08 (×2): qty 1

## 2015-11-08 MED ORDER — DIPHENHYDRAMINE HCL 25 MG PO CAPS: 25.0000 mg | ORAL_CAPSULE | ORAL | Status: DC | PRN

## 2015-11-08 MED ORDER — MORPHINE SULFATE (PF) 0.5 MG/ML IJ SOLN
INTRAMUSCULAR | Status: AC
  Filled 2015-11-08: qty 10

## 2015-11-08 MED ORDER — MEPERIDINE HCL 25 MG/ML IJ SOLN
6.2500 mg | INTRAMUSCULAR | Status: DC | PRN
Start: 2015-11-08 — End: 2015-11-08

## 2015-11-08 MED ORDER — ONDANSETRON HCL 4 MG/2ML IJ SOLN: 4.0000 mg | Freq: Three times a day (TID) | INTRAMUSCULAR | Status: DC | PRN

## 2015-11-08 MED ORDER — LACTATED RINGERS IV SOLN: INTRAVENOUS | Status: DC

## 2015-11-08 MED ORDER — NALBUPHINE HCL 10 MG/ML IJ SOLN: 5.0000 mg | Freq: Once | INTRAMUSCULAR | Status: DC | PRN

## 2015-11-08 MED ORDER — CEFAZOLIN SODIUM-DEXTROSE 2-4 GM/100ML-% IV SOLN
2.0000 g | INTRAVENOUS | Status: AC
  Administered 2015-11-08: 2 g via INTRAVENOUS

## 2015-11-08 MED ORDER — MORPHINE SULFATE (PF) 0.5 MG/ML IJ SOLN
INTRAMUSCULAR | Status: DC | PRN
  Administered 2015-11-08: .2 mg via INTRATHECAL

## 2015-11-08 MED ORDER — NALBUPHINE HCL 10 MG/ML IJ SOLN: 5.0000 mg | INTRAMUSCULAR | Status: DC | PRN

## 2015-11-08 MED ORDER — SODIUM CHLORIDE 0.9% FLUSH: 3.0000 mL | INTRAVENOUS | Status: DC | PRN

## 2015-11-08 MED ORDER — OXYTOCIN 10 UNIT/ML IJ SOLN
INTRAMUSCULAR | Status: AC
  Filled 2015-11-08: qty 4

## 2015-11-08 MED ORDER — SIMETHICONE 80 MG PO CHEW
80.0000 mg | CHEWABLE_TABLET | Freq: Three times a day (TID) | ORAL | Status: DC
  Administered 2015-11-09 – 2015-11-11 (×6): 80 mg via ORAL
  Filled 2015-11-08 (×6): qty 1

## 2015-11-08 MED ORDER — SODIUM CHLORIDE 0.9 % IR SOLN
Status: DC | PRN
  Administered 2015-11-08: 1

## 2015-11-08 MED ORDER — ONDANSETRON HCL 4 MG/2ML IJ SOLN
INTRAMUSCULAR | Status: DC | PRN
  Administered 2015-11-08: 4 mg via INTRAVENOUS

## 2015-11-08 MED ORDER — DIPHENHYDRAMINE HCL 25 MG PO CAPS: 25.0000 mg | ORAL_CAPSULE | Freq: Four times a day (QID) | ORAL | Status: DC | PRN

## 2015-11-08 MED ORDER — FENTANYL CITRATE (PF) 100 MCG/2ML IJ SOLN
INTRAMUSCULAR | Status: AC
  Filled 2015-11-08: qty 2

## 2015-11-08 MED ORDER — MEPERIDINE HCL 25 MG/ML IJ SOLN: 6.2500 mg | INTRAMUSCULAR | Status: DC | PRN

## 2015-11-08 MED ORDER — LACTATED RINGERS IV SOLN
INTRAVENOUS | Status: DC | PRN
  Administered 2015-11-08 (×4): via INTRAVENOUS

## 2015-11-08 MED ORDER — PHENYLEPHRINE 8 MG IN D5W 100 ML (0.08MG/ML) PREMIX OPTIME
INJECTION | INTRAVENOUS | Status: AC
  Filled 2015-11-08: qty 100

## 2015-11-08 MED ORDER — IBUPROFEN 600 MG PO TABS
600.0000 mg | ORAL_TABLET | Freq: Four times a day (QID) | ORAL | Status: DC
  Administered 2015-11-09 – 2015-11-11 (×10): 600 mg via ORAL
  Filled 2015-11-08 (×10): qty 1

## 2015-11-08 MED ORDER — BUPIVACAINE IN DEXTROSE 0.75-8.25 % IT SOLN
INTRATHECAL | Status: DC | PRN
  Administered 2015-11-08: 1.6 mL via INTRATHECAL

## 2015-11-08 MED ORDER — NALOXONE HCL 0.4 MG/ML IJ SOLN: 0.4000 mg | INTRAMUSCULAR | Status: DC | PRN

## 2015-11-08 MED ORDER — SCOPOLAMINE 1 MG/3DAYS TD PT72: 1.0000 | MEDICATED_PATCH | Freq: Once | TRANSDERMAL | Status: DC

## 2015-11-08 MED ORDER — FENTANYL CITRATE (PF) 100 MCG/2ML IJ SOLN
INTRAMUSCULAR | Status: DC | PRN
  Administered 2015-11-08: 10 ug via INTRATHECAL

## 2015-11-08 MED ORDER — LACTATED RINGERS IV SOLN
INTRAVENOUS | Status: DC | PRN
  Administered 2015-11-08: 13:00:00 via INTRAVENOUS

## 2015-11-08 MED ORDER — PRENATAL MULTIVITAMIN CH
1.0000 | ORAL_TABLET | Freq: Every day | ORAL | Status: DC
  Administered 2015-11-09 – 2015-11-11 (×3): 1 via ORAL
  Filled 2015-11-08 (×3): qty 1

## 2015-11-08 MED ORDER — KETOROLAC TROMETHAMINE 30 MG/ML IJ SOLN
INTRAMUSCULAR | Status: AC
  Filled 2015-11-08: qty 1

## 2015-11-08 MED ORDER — MENTHOL 3 MG MT LOZG: 1.0000 | LOZENGE | OROMUCOSAL | Status: DC | PRN

## 2015-11-08 MED ORDER — SENNOSIDES-DOCUSATE SODIUM 8.6-50 MG PO TABS
2.0000 | ORAL_TABLET | ORAL | Status: DC
  Administered 2015-11-10 – 2015-11-11 (×2): 2 via ORAL
  Filled 2015-11-08 (×2): qty 2

## 2015-11-08 MED ORDER — ACETAMINOPHEN 325 MG PO TABS: 650.0000 mg | ORAL_TABLET | ORAL | Status: DC | PRN

## 2015-11-08 MED ORDER — PROMETHAZINE HCL 25 MG/ML IJ SOLN: 6.2500 mg | INTRAMUSCULAR | Status: DC | PRN

## 2015-11-08 MED ORDER — COCONUT OIL OIL: 1.0000 "application " | TOPICAL_OIL | Status: DC | PRN

## 2015-11-08 MED ORDER — FENTANYL CITRATE (PF) 100 MCG/2ML IJ SOLN: 25.0000 ug | INTRAMUSCULAR | Status: DC | PRN

## 2015-11-08 MED ORDER — TETANUS-DIPHTH-ACELL PERTUSSIS 5-2.5-18.5 LF-MCG/0.5 IM SUSP
0.5000 mL | Freq: Once | INTRAMUSCULAR | Status: DC
Start: 2015-11-09 — End: 2015-11-11

## 2015-11-08 MED ORDER — ONDANSETRON HCL 4 MG/2ML IJ SOLN
INTRAMUSCULAR | Status: AC
  Filled 2015-11-08: qty 2

## 2015-11-08 MED ORDER — SIMETHICONE 80 MG PO CHEW: 80.0000 mg | CHEWABLE_TABLET | ORAL | Status: DC | PRN

## 2015-11-08 MED ORDER — NALOXONE HCL 2 MG/2ML IJ SOSY
1.0000 ug/kg/h | PREFILLED_SYRINGE | INTRAVENOUS | Status: DC | PRN
  Filled 2015-11-08: qty 2

## 2015-11-08 MED ORDER — WITCH HAZEL-GLYCERIN EX PADS: 1.0000 "application " | MEDICATED_PAD | CUTANEOUS | Status: DC | PRN

## 2015-11-08 MED ORDER — OXYTOCIN 40 UNITS IN LACTATED RINGERS INFUSION - SIMPLE MED: 2.5000 [IU]/h | INTRAVENOUS | Status: AC

## 2015-11-08 MED ORDER — KETOROLAC TROMETHAMINE 30 MG/ML IJ SOLN
30.0000 mg | Freq: Four times a day (QID) | INTRAMUSCULAR | Status: AC | PRN
  Administered 2015-11-08: 30 mg via INTRAVENOUS
  Filled 2015-11-08: qty 1

## 2015-11-08 MED ORDER — KETOROLAC TROMETHAMINE 30 MG/ML IJ SOLN
30.0000 mg | Freq: Four times a day (QID) | INTRAMUSCULAR | Status: AC | PRN
  Administered 2015-11-08: 30 mg via INTRAMUSCULAR

## 2015-11-08 MED ORDER — LACTATED RINGERS IV SOLN
40.0000 [IU] | INTRAVENOUS | Status: DC | PRN
  Administered 2015-11-08: 40 [IU] via INTRAVENOUS

## 2015-11-08 MED ORDER — DIBUCAINE 1 % RE OINT: 1.0000 "application " | TOPICAL_OINTMENT | RECTAL | Status: DC | PRN

## 2015-11-08 SURGICAL SUPPLY — 40 items
BENZOIN TINCTURE PRP APPL 2/3 (GAUZE/BANDAGES/DRESSINGS) ×3 IMPLANT
CLAMP CORD UMBIL (MISCELLANEOUS) IMPLANT
CLOSURE STERI STRIP 1/2 X4 (GAUZE/BANDAGES/DRESSINGS) ×3 IMPLANT
CLOSURE WOUND 1/2 X4 (GAUZE/BANDAGES/DRESSINGS)
CLOTH BEACON ORANGE TIMEOUT ST (SAFETY) ×3 IMPLANT
DRSG OPSITE POSTOP 4X10 (GAUZE/BANDAGES/DRESSINGS) ×3 IMPLANT
DURAPREP 26ML APPLICATOR (WOUND CARE) ×3 IMPLANT
ELECT REM PT RETURN 9FT ADLT (ELECTROSURGICAL) ×3
ELECTRODE REM PT RTRN 9FT ADLT (ELECTROSURGICAL) ×1 IMPLANT
EXTRACTOR VACUUM KIWI (MISCELLANEOUS) IMPLANT
GLOVE BIO SURGEON ST LM GN SZ9 (GLOVE) ×3 IMPLANT
GLOVE BIOGEL PI IND STRL 7.0 (GLOVE) ×1 IMPLANT
GLOVE BIOGEL PI IND STRL 9 (GLOVE) ×1 IMPLANT
GLOVE BIOGEL PI INDICATOR 7.0 (GLOVE) ×2
GLOVE BIOGEL PI INDICATOR 9 (GLOVE) ×2
GOWN STRL REUS W/TWL 2XL LVL3 (GOWN DISPOSABLE) ×3 IMPLANT
GOWN STRL REUS W/TWL LRG LVL3 (GOWN DISPOSABLE) ×3 IMPLANT
NEEDLE HYPO 25X5/8 SAFETYGLIDE (NEEDLE) IMPLANT
NS IRRIG 1000ML POUR BTL (IV SOLUTION) ×3 IMPLANT
PACK C SECTION WH (CUSTOM PROCEDURE TRAY) ×3 IMPLANT
PAD ABD 7.5X8 STRL (GAUZE/BANDAGES/DRESSINGS) ×3 IMPLANT
PAD OB MATERNITY 4.3X12.25 (PERSONAL CARE ITEMS) ×3 IMPLANT
PENCIL SMOKE EVAC W/HOLSTER (ELECTROSURGICAL) ×3 IMPLANT
RTRCTR C-SECT PINK 25CM LRG (MISCELLANEOUS) IMPLANT
RTRCTR C-SECT PINK 34CM XLRG (MISCELLANEOUS) IMPLANT
SPONGE DRAIN TRACH 4X4 STRL 2S (GAUZE/BANDAGES/DRESSINGS) ×6 IMPLANT
STRIP CLOSURE SKIN 1/2X4 (GAUZE/BANDAGES/DRESSINGS) IMPLANT
SUT MNCRL 0 VIOLET CTX 36 (SUTURE) ×2 IMPLANT
SUT MONOCRYL 0 CTX 36 (SUTURE) ×4
SUT PLAIN 2 0 (SUTURE) ×2
SUT PLAIN ABS 2-0 CT1 27XMFL (SUTURE) ×1 IMPLANT
SUT VIC AB 0 CT1 27 (SUTURE) ×2
SUT VIC AB 0 CT1 27XBRD ANBCTR (SUTURE) ×1 IMPLANT
SUT VIC AB 2-0 CT1 27 (SUTURE) ×2
SUT VIC AB 2-0 CT1 TAPERPNT 27 (SUTURE) ×1 IMPLANT
SUT VIC AB 4-0 KS 27 (SUTURE) ×3 IMPLANT
SYR BULB IRRIGATION 50ML (SYRINGE) IMPLANT
TOWEL OR 17X24 6PK STRL BLUE (TOWEL DISPOSABLE) ×3 IMPLANT
TRAY FOLEY CATH SILVER 14FR (SET/KITS/TRAYS/PACK) ×3 IMPLANT
YANKAUER SUCT BULB TIP NO VENT (SUCTIONS) ×3 IMPLANT

## 2015-11-08 NOTE — Transfer of Care (Signed)
Immediate Anesthesia Transfer of Care Note  Patient: Danielle Sawyer  Procedure(s) Performed: Procedure(s) with comments: REPEAT CESAREAN SECTION (N/A) - [place] Ditto to RNFA  Patient Location: PACU  Anesthesia Type:Spinal  Level of Consciousness: awake, alert  and oriented  Airway & Oxygen Therapy: Patient Spontanous Breathing  Post-op Assessment: Report given to RN and Post -op Vital signs reviewed and stable  Post vital signs: Reviewed  Last Vitals:  Filed Vitals:   11/08/15 1509 11/08/15 1620  BP: 103/50 111/52  Pulse: 63 76  Temp: 36.9 C 36.9 C  Resp: 16 16    Last Pain:  Filed Vitals:   11/08/15 1649  PainSc: 2       Patients Stated Pain Goal: 4 ([id number])  Complications: No apparent anesthesia complications

## 2015-11-08 NOTE — Anesthesia Procedure Notes (Signed)
Spinal Patient location during procedure: OR Start time: 11/08/2015 12:33 PM End time: 11/08/2015 12:34 PM Staffing Anesthesiologist: Suella Broad D Performed by: anesthesiologist  Preanesthetic Checklist Completed: patient identified, site marked, surgical consent, pre-op evaluation, timeout performed, IV checked, risks and benefits discussed and monitors and equipment checked Spinal Block Patient position: sitting Prep: Betadine Patient monitoring: heart rate, continuous pulse ox, blood pressure and cardiac monitor Approach: midline Location: L4-5 Injection technique: single-shot Needle Needle type: [place]acre and Introducer  Needle gauge: 24 G Needle length: 9 cm Additional Notes Negative paresthesia. Negative blood return. Positive free-flowing CSF. Expiration date of kit checked and confirmed. Patient tolerated procedure well, without complications.

## 2015-11-08 NOTE — H&P (Addendum)
Danielle Sawyer is a 35 y.o. female presenting for Repeat cesarean section,at 39.0wk.**. History OB History    Gravida Para Term Preterm AB TAB SAB Ectopic Multiple Living   3 2 2       0 1      Obstetric Comments   Neonatal demise on Day 3 of life     Past Medical History  Diagnosis Date  . Medical history non-contributory    Past Surgical History  Procedure Laterality Date  . Wisdom tooth extraction      x3  . Cesarean section N/A 12/19/2012    Procedure: CESAREAN SECTION;  Surgeon: Woodroe Mode, MD;  Location: [facility];  Service: Obstetrics;  Laterality: N/A;  primary  . Dilation and evacuation N/A 11/06/2014    Procedure: DILATATION AND EVACUATION UNDER ULTRASOUND GUIDANCE;  Surgeon: Osborne Oman, MD;  Location: [facility];  Service: Gynecology;  Laterality: N/A;   Family History: family history includes Cancer in her maternal aunt, maternal grandfather, maternal grandmother, paternal aunt, paternal aunt, paternal grandfather, and paternal grandmother; Heart disease in her father. Social History:  reports that she has never smoked. She has never used smokeless tobacco. She reports that she does not drink alcohol or use illicit drugs. CBC    Component Value Date/Time   WBC 12.2* 11/06/2015 0950   RBC 3.75* 11/06/2015 0950   HGB 11.9* 11/06/2015 0950   HCT 35.2* 11/06/2015 0950   PLT 214 11/06/2015 0950   MCV 93.9 11/06/2015 0950   MCH 31.7 11/06/2015 0950   MCHC 33.8 11/06/2015 0950   RDW 14.0 11/06/2015 0950   LYMPHSABS 2.2 03/31/2015 1549   MONOABS 0.6 03/31/2015 1549   EOSABS 0.2 03/31/2015 1549   BASOSABS 0.0 03/31/2015 1549      Prenatal Transfer Tool  Maternal Diabetes: No Genetic Screening: Normal Maternal Ultrasounds/Referrals: Normal Fetal Ultrasounds or other Referrals:  None Maternal Substance Abuse:  No Significant Maternal Medications:  None Significant Maternal Lab Results:  GBS pos Other Comments:  not intereste in sterilization  ROS    Blood  pressure 111/75, pulse 82, temperature 98.5 F (36.9 C), temperature source Axillary, resp. rate 18, last menstrual period 02/08/2015. Exam Physical Exam  Constitutional: She is oriented to person, place, and time. She appears well-developed and well-nourished.  HENT:  Head: Normocephalic and atraumatic.  Eyes: Pupils are equal, round, and reactive to light.  Neck: Normal range of motion.  Cardiovascular: Normal rate.   Respiratory: Effort normal.  GI: Soft.  Gravid uterus c/w dates  Genitourinary: Vagina normal.  Musculoskeletal: Normal range of motion.  Neurological: She is alert and oriented to person, place, and time. She has normal reflexes.  Skin: Skin is warm and dry.  Psychiatric: She has a normal mood and affect. Her behavior is normal. Judgment and thought content normal.    Prenatal labs: ABO, Rh: --/--/A POS (06/16 0950) Antibody: NEG (06/16 0950) Rubella: 1.35 (11/08 1549) RPR: Non Reactive (06/16 0950)  HBsAg: NEGATIVE (11/08 1549)  HIV: NONREACTIVE (03/31 0925)  GBS:   gbs positive.   Assessment/Plan: Pregnancy 39 wk, prior cesarean not desiring tolac,  For repeat cesarean.   Rally Ouch V gbs

## 2015-11-08 NOTE — Anesthesia Postprocedure Evaluation (Signed)
Anesthesia Post Note  Patient: Danielle Sawyer  Procedure(s) Performed: Procedure(s) (LRB): REPEAT CESAREAN SECTION (N/A)  Patient location during evaluation: PACU Anesthesia Type: Spinal Level of consciousness: oriented and awake and alert Pain management: pain level controlled Vital Signs Assessment: post-procedure vital signs reviewed and stable Respiratory status: spontaneous breathing, respiratory function stable and patient connected to nasal cannula oxygen Cardiovascular status: blood pressure returned to baseline and stable Postop Assessment: no headache, no backache and spinal receding Anesthetic complications: no     Last Vitals:  Filed Vitals:   11/08/15 1440 11/08/15 1441  BP:    Pulse: 62 63  Temp:    Resp: 16 21    Last Pain:  Filed Vitals:   11/08/15 1442  PainSc: 2    Pain Goal: Patients Stated Pain Goal: 4 (11/08/15 1430)               Effie Berkshire

## 2015-11-08 NOTE — Op Note (Signed)
Please see the brief operative note for surgical details and dictation

## 2015-11-08 NOTE — Telephone Encounter (Signed)
Pt contacted by phone to come earlier for surgery. She will come in at 9.30.

## 2015-11-08 NOTE — Brief Op Note (Signed)
11/08/2015  [id number] PM  PATIENT:  Danielle Sawyer  35 y.o. female  PRE-OPERATIVE DIAGNOSIS:  repeat cesarean section pregnancy 39 Weeks not for trial of labor  POST-OPERATIVE DIAGNOSIS:  repeat cesarean section pregnancy 39 weeks  PROCEDURE:  Procedure(s) with comments: REPEAT CESAREAN SECTION (N/A) - Cheree Ditto to RNFA  SURGEON:  Surgeon(s) and Role:    * Jonnie Kind, MD - Primary  PHYSICIAN ASSISTANT:   ASSISTANTS: none   ANESTHESIA:   spinal  EBL:  Total I/O In: 3300 [I.V.:3300] Out: 700 [Urine:100; Blood:600]  BLOOD ADMINISTERED:none  DRAINS: Urinary Catheter (Foley)   LOCAL MEDICATIONS USED:  NONE  SPECIMEN:  Source of Specimen:  Placenta to labor and delivery  DISPOSITION OF SPECIMEN:  Labor and delivery  COUNTS:  YES  TOURNIQUET:  * No tourniquets in log *  DICTATION: .Dragon Dictation  PLAN OF CARE: Admit to inpatient   PATIENT DISPOSITION:  PACU - hemodynamically stable.   Delay start of Pharmacological VTE agent (>24hrs) due to surgical blood loss or risk of bleeding: not applicable Indications gravida 3 para. 300 2 at 39 0 weeks prior cesarean section prior VBAC with postpartum complications a retained placenta, who requests repeat cesarean section at 39 weeks Findings: Healthy infant vertex presentation thick well-developed lower uterine segment no intra-abdominal adhesions. Details of procedure:. Patient was taken operating room prepped and draped for lower abdominal surgery with timeout conducted prepping and draping performed,, and antibiotics administered. Procedure confirmed by surgical team. 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 Up was developed, Alexis wound retractor in position, and transverse uterine incision performed over the fetal ear, clear  fluid obtained. Fetal vertex was rotated into the incision and delivered by fundal pressure. The was a healthy baby and I fluid was clear. Senna delivered slowly and response to Pitocin and Cred uterine massage, with some retained tiny fragments of the assessment on the posterior uterine wall which was treated with vigorous swabbing with a dry gauze and debris was removed. Uterine fundus was inspected and was without any retained tissue and all membranes were removed. Ring forceps were kept on a large vein on the left side bled vigorously. Figure-of-eight suture was placed at the left corner of the uterine incision and tagged. Remainder the incision was closed in a 2 layer closure, running locking first layer of 0 Monocryl followed by continuous running second layer of 0 Monocryl as well with good surgical reapproximation and hemostasis. One small bladder adhesions to the lower uterine segment on the left side required sharp transection stasis was good. Abdomen was irrigated, anterior peritoneum edges identified grasped retracted and reapproximated using continuous running 2-0 Vicryl she was closed with 0 Vicryl, subcutaneous tissues irrigated and closed with interrupted horizontal mattress sutures 3 of 2-0 plain and subcuticular 4-0 Vicryl close the skin incision dressing will be applied due to the oozing from the superficial skin on the left side. Sponge and needle counts were correct.

## 2015-11-09 LAB — CBC
HEMATOCRIT: 32.2 % — AB (ref 36.0–46.0)
HEMOGLOBIN: 10.7 g/dL — AB (ref 12.0–15.0)
MCH: 31.3 pg (ref 26.0–34.0)
MCHC: 33.2 g/dL (ref 30.0–36.0)
MCV: 94.2 fL (ref 78.0–100.0)
Platelets: 198 10*3/uL (ref 150–400)
RBC: 3.42 MIL/uL — AB (ref 3.87–5.11)
RDW: 13.6 % (ref 11.5–15.5)
WBC: 13.6 10*3/uL — ABNORMAL HIGH (ref 4.0–10.5)

## 2015-11-09 LAB — BIRTH TISSUE RECOVERY COLLECTION (PLACENTA DONATION)

## 2015-11-09 NOTE — Anesthesia Postprocedure Evaluation (Signed)
Anesthesia Post Note  Patient: Danielle Sawyer  Procedure(s) Performed: Procedure(s) (LRB): REPEAT CESAREAN SECTION (N/A)  Patient location during evaluation: Mother Baby Anesthesia Type: Spinal Level of consciousness: awake and alert and oriented Pain management: satisfactory to patient Vital Signs Assessment: post-procedure vital signs reviewed and stable Respiratory status: spontaneous breathing and nonlabored ventilation Cardiovascular status: stable Postop Assessment: no headache, no backache, patient able to bend at knees, no signs of nausea or vomiting and adequate PO intake Anesthetic complications: no     Last Vitals:  Filed Vitals:   11/08/15 1705 11/08/15 1801  BP: 104/66 101/61  Pulse:  63  Temp:  36.9 C  Resp: 18 18    Last Pain:  Filed Vitals:   11/09/15 0647  PainSc: 2    Pain Goal: Patients Stated Pain Goal: 4 (11/08/15 1430)               Willa Rough

## 2015-11-09 NOTE — Lactation Note (Addendum)
This note was copied from a baby's chart. Lactation Consultation Note  Baby is hungry and mother is working with her on BF. SHe reports that baby is not latching easily.  SHe used a NS with her 35 yo for 6 weeks and continued to BF for a year.  Assisted her with positioning and after a few attempts baby latched and suckled. Some swallows were noted. Encouraged mother to use breast compression to aid baby in transfer.  Explained that hand expressing to collect colostrum would be beneficial so that colostrum would be available if the baby needed it later. She stated that she knew how to hand express but could not explain it to me. Mom is very guarded and not making much eye contact. Explained to her that she could work on hand expressing and discuss it with her RN later.  Follow-up tomorrow.  Patient Name: Danielle Sawyer [id number] Date: 11/09/2015 Reason for consult: Initial assessment   Maternal Data Has patient been taught Hand Expression?: Yes  Feeding Feeding Type: Breast Fed Length of feed: 15 min  LATCH Score/Interventions Latch: Repeated attempts needed to sustain latch, nipple held in mouth throughout feeding, stimulation needed to elicit sucking reflex.  Audible Swallowing: A few with stimulation  Type of Nipple: Everted at rest and after stimulation  Comfort (Breast/Nipple): Soft / non-tender     Hold (Positioning): Assistance needed to correctly position infant at breast and maintain latch.  LATCH Score: 7  Lactation Tools Discussed/Used Tools:  (has NS but not using)   Consult Status Consult Status: Follow-up Date: 11/10/15 Follow-up type: In-patient    Van Clines 11/09/2015, 12:22 PM

## 2015-11-09 NOTE — Progress Notes (Signed)
LCSW received consult for: Last pregnancy w/neonatal demise at 3 days, concerns w/father and infant bonding per RN, emotional support needs  LCSW reviewed chart, discussed case with RN taking care of MOB and met with patient at the bedside. Although MOB was appreciative for consult, she declined services from SW at this time. She was breastfeeding baby in room and maternal grandmother in room as well offering support and help to MOB. MOB has all supplies and needs for baby at home per report. Her mood is uplifted and she smiles, but does not engage in conversation with LCSW or report any current problems at this time. She denies any emotional support at this time. NO concerns from RN regarding FOB or issues with bonding.  LCSW discussed emotional support with MOB and if needs arise, have RN call. MOB was appreciative and agreeable to plan.  LCSW will sign off at this time as MOB declines services/interventions/needs at this time.  Danielle Kluth LCSW, MSW Clinical Social Work: [employer]  

## 2015-11-09 NOTE — Progress Notes (Signed)
Patient ambulation held off until nausea subsided at 2130. Bed bath given with patients assistance, clean gown, pad and underwear given. Patient ambulated into the chair with minimal to no assistance. Foley drained for 325 of clear amber colored urine.

## 2015-11-10 NOTE — Lactation Note (Signed)
This note was copied from a baby's chart. Lactation Consultation Note Baby had 6% weight loss in past 24hrs. Total of 8% since birth. Output 8 voids, 5 stools, and a few emesis. Mom was BF when LC enter rm. Had baby in cross cradle position. Mom needed positioners for comfort. Baby was fussy. Suggested football position. Mom stated OK. After unlatched nipple normal shape. Mom has small everted nipples. Discussed supplementing d/t weight loss and low weight. Mom in agreement. Reviewed supplementation information sheet from [facility]. Gave Alimentum, and slow flow nipples. Mom shown how to use DEBP & how to disassemble, clean, & reassemble parts. Mom knows to pump q3h for 15-20 min. Plan to BF, give supplementation (colostrum first then subtract total from formula). Pump and hand express colostrum for next feeding, or after feeding. Stressed the importance of the supplementation d/t weight 5.2lbs. Mom in agreement.  Patient Name: Danielle Sawyer [id number] Date: 11/10/2015 Reason for consult: Follow-up assessment;Infant weight loss   Maternal Data    Feeding Feeding Type: Breast Fed Length of feed: 30 min  LATCH Score/Interventions Latch: Repeated attempts needed to sustain latch, nipple held in mouth throughout feeding, stimulation needed to elicit sucking reflex. Intervention(s): Adjust position;Breast massage;Breast compression  Audible Swallowing: A few with stimulation  Type of Nipple: Everted at rest and after stimulation  Comfort (Breast/Nipple): Filling, red/small blisters or bruises, mild/mod discomfort  Problem noted: Mild/Moderate discomfort Interventions (Mild/moderate discomfort): Hand massage;Hand expression  Hold (Positioning): Assistance needed to correctly position infant at breast and maintain latch. Intervention(s): Support Pillows;Position options;Breastfeeding basics reviewed;Skin to skin  LATCH Score: 6  Lactation Tools Discussed/Used Tools: Pump Breast pump type:  Double-Electric Breast Pump Pump Review: Setup, frequency, and cleaning;Milk Storage Initiated by:: Allayne Stack RN Date initiated:: 11/10/15   Consult Status Consult Status: Follow-up Date: 11/10/15 (in pm) Follow-up type: In-patient    Theodoro Kalata 11/10/2015, 5:32 AM

## 2015-11-10 NOTE — Progress Notes (Signed)
POSTPARTUM PROGRESS NOTE  Post Partum Day 2 Subjective:  Danielle Sawyer is a 35 y.o. [id number] [redacted]w[redacted]d s/p rLTCS.  No acute events overnight.  Pt denies problems with ambulating, voiding or po intake.  She denies nausea or vomiting.  Pain is well controlled.  She has had flatus. She has had bowel movement.  Lochia Minimal.   Objective: Blood pressure 112/59, pulse 57, temperature 98.3 F (36.8 C), temperature source Oral, resp. rate 18, last menstrual period 02/08/2015, SpO2 99 %, unknown if currently breastfeeding.  Physical Exam:  General: alert, cooperative and no distress Lochia:normal flow Chest: CTAB Heart: RRR no m/r/g Abdomen: +BS, soft, nontender,  Uterine Fundus: firm, 2 below umbilicus DVT Evaluation: No calf swelling or tenderness Extremities: No edema   Recent Labs  11/09/15 0538  HGB 10.7*  HCT 32.2*    Assessment/Plan:  ASSESSMENT: Danielle Sawyer is a 35 y.o. [id number] [redacted]w[redacted]d s/p rLTCS  Plan for discharge tomorrow, Breastfeeding and Contraception Vasectomy Pt refused social work consult  LOS: 2 days   [name] 11/10/2015, 9:01 AM

## 2015-11-10 NOTE — Lactation Note (Signed)
This note was copied from a baby's chart. Lactation Consultation Note; Parents report baby has been very sleepy and lethargic today. Mom reports baby breast fed well yesterday and though the night but has been very sleepy today. Has bottle fed EBM and formula today and parents report baby spit up "alot" Assisted mom with waking baby and she latched well with lots of deep sucks and audible swallows. Did need some stimulation and breast compression to continue nursing. Mom reports her breasts are feeling a little fuller today and now feels softer after nursing. Pleased she has done so well.  Baby off to sleep. Did not offer EBM at bedside because she was so content and had been spitting up this morning. To call for assist prn No questions at present. Encouragement given.   Patient Name: Danielle Sawyer Fabila [id number] Date: 11/10/2015 Reason for consult: Follow-up assessment;Infant < 6lbs   Maternal Data Formula Feeding for Exclusion: No Has patient been taught Hand Expression?: Yes Does the patient have breastfeeding experience prior to this delivery?: Yes  Feeding Feeding Type: Breast Fed Nipple Type: Slow - flow Length of feed: 15 min  LATCH Score/Interventions Latch: Grasps breast easily, tongue down, lips flanged, rhythmical sucking.  Audible Swallowing: Spontaneous and intermittent  Type of Nipple: Everted at rest and after stimulation  Comfort (Breast/Nipple): Filling, red/small blisters or bruises, mild/mod discomfort (positional stripe)  Interventions (Mild/moderate discomfort): Hand expression  Hold (Positioning): Assistance needed to correctly position infant at breast and maintain latch. Intervention(s): Breastfeeding basics reviewed;Position options  LATCH Score: 8  Lactation Tools Discussed/Used     Consult Status Consult Status: Follow-up Date: 11/11/15 Follow-up type: In-patient    Truddie Crumble 11/10/2015, 2:40 PM

## 2015-11-11 MED ORDER — SIMETHICONE 80 MG PO CHEW: 80.0000 mg | CHEWABLE_TABLET | Freq: Three times a day (TID) | ORAL | Status: DC

## 2015-11-11 MED ORDER — OXYCODONE-ACETAMINOPHEN 5-325 MG PO TABS: 1.0000 | ORAL_TABLET | Freq: Four times a day (QID) | ORAL | Status: DC | PRN

## 2015-11-11 MED ORDER — SENNOSIDES-DOCUSATE SODIUM 8.6-50 MG PO TABS: 2.0000 | ORAL_TABLET | ORAL | Status: DC

## 2015-11-11 NOTE — Discharge Summary (Signed)
    OB Discharge Summary     Patient Name: Danielle Sawyer DOB: 1980-12-17 MRN: [id number]  Date of admission: 11/08/2015 Delivering MD: Jonnie Kind   Date of discharge: 11/11/2015  Admitting diagnosis: cpt [phone] - REPEAT c-section Intrauterine pregnancy: [redacted]w[redacted]d     Secondary diagnosis:  Active Problems:   Status post repeat low transverse cesarean section  Additional problems: None     Discharge diagnosis: Term Pregnancy Delivered                                                                                                Post partum procedures:None  Augmentation: AROM  Complications: None  Hospital course:  Sceduled C/S   35 y.o. yo [id number] at [redacted]w[redacted]d was admitted to the hospital 11/08/2015 for scheduled cesarean section with the following indication:Elective Repeat.  Membrane Rupture Time/Date: 12:56 PM ,11/08/2015   Patient delivered a Viable infant.11/08/2015  Details of operation can be found in separate operative note.  Pateint had an uncomplicated postpartum course.  She is ambulating, tolerating a regular diet, passing flatus, and urinating well. Patient is discharged home in stable condition on  11/11/2015          Physical exam  Filed Vitals:   11/09/15 1818 11/10/15 0651 11/10/15 1844 11/11/15 0637  BP: 108/63 112/59 113/65 118/70  Pulse: 61 57 54 60  Temp: 97.8 F (36.6 C) 98.3 F (36.8 C) 98.3 F (36.8 C) 98.2 F (36.8 C)  TempSrc: Oral Oral  Oral  Resp: 16 18 16 16   SpO2:       General: alert, cooperative and no distress Lochia: appropriate Uterine Fundus: firm Incision: Healing well with no significant drainage, No significant erythema, Dressing is clean, dry, and intact DVT Evaluation: No evidence of DVT seen on physical exam. Labs: Lab Results  Component Value Date   WBC 13.6* 11/09/2015   HGB 10.7* 11/09/2015   HCT 32.2* 11/09/2015   MCV 94.2 11/09/2015   PLT 198 11/09/2015   No flowsheet data found.  Discharge instruction: per After  Visit Summary and "Baby and Me Booklet".  After visit meds:    Medication List    ASK your doctor about these medications        multivitamin-prenatal 27-0.8 MG Tabs tablet  Take 1 tablet by mouth daily at 12 noon.        Diet: routine diet  Activity: Advance as tolerated. Pelvic rest for 6 weeks.   Outpatient follow up:6 weeks Follow up Appt:No future appointments. Follow up Visit:No Follow-up on file.  Postpartum contraception: Vasectomy  Newborn Data: Live born female  Birth Weight: 5 lb 9.2 oz (2530 g) APGAR: 9, 9  Baby Feeding: Breast Disposition:home with mother   11/11/2015 Danielle Guernsey, MD   OB FELLOW DISCHARGE ATTESTATION  I have seen and examined this patient and agree with above documentation in the resident's note.   Desma Maxim, MD 9:08 AM

## 2015-11-11 NOTE — Lactation Note (Signed)
This note was copied from a baby's chart. Lactation Consultation Note; Mom reports that baby has been doing better with breast feeding. Reports some times it takes a while to get her latched but once latched she does well. Mom's breasts are filling today. Reviewed engorgement prevention and treatment. Has Medela pump for home. Attempted to latch baby but she is sleepy and only took a few sucks- had 30 ml of EBM 2 hours ago. Left skin to skin with mom and encouraged to try again. No questions at present. Reviewed OP appointments and our phone number to call with questions/concerns.  Patient Name: Danielle Sawyer [id number] Date: 11/11/2015 Reason for consult: Follow-up assessment;Infant < 6lbs   Maternal Data Formula Feeding for Exclusion: No Has patient been taught Hand Expression?: Yes Does the patient have breastfeeding experience prior to this delivery?: Yes  Feeding Feeding Type: Breast Fed  LATCH Score/Interventions Latch: Too sleepy or reluctant, no latch achieved, no sucking elicited. (few sucks) Intervention(s): Adjust position;Assist with latch;Breast massage;Breast compression  Audible Swallowing: None Intervention(s): Hand expression;Skin to skin  Type of Nipple: Everted at rest and after stimulation  Comfort (Breast/Nipple): Filling, red/small blisters or bruises, mild/mod discomfort  Problem noted: Filling;Mild/Moderate discomfort Interventions (Filling): Frequent nursing Interventions (Mild/moderate discomfort): Hand expression  Hold (Positioning): Assistance needed to correctly position infant at breast and maintain latch. Intervention(s): Breastfeeding basics reviewed  LATCH Score: 4  Lactation Tools Discussed/Used WIC Program: No   Consult Status Consult Status: Complete    Truddie Crumble 11/11/2015, 9:28 AM

## 2015-11-12 ENCOUNTER — Telehealth (HOSPITAL_COMMUNITY): Payer: Self-pay | Admitting: Lactation Services

## 2015-11-12 NOTE — Telephone Encounter (Signed)
Baby was 5lb9oz at birth, 5lb7oz day 1, 5lb2oz day 2,  5lb2oz day 3. Day 3 is when mom started supplementing. Baby was 5lb1oz today at the [facility] office and mom is worried. Mom stated baby was born at 1wk and is 79 days old today. Baby is is very sleepy. She is trying to bf every 2 hr but only bf well x2 today, all other feedings baby slept. Mom is offering her pumped milk and formula. After every bf or every 3 hr she is pumping and gets 20-7ml each time. The goal for supplementing is 49ml at each feeding. Mom offers her milk first then formula as needed. Peds wants baby to eat 8-10x/24hr. Baby should only stay on the breast for 30 minutes total. They are taking her clothes off and "messing" with baby while she is eating. Baby no longer latches without the NS. Mom is using a Medela DEBP and feels like it is working well. Mom has an OP apt with lactation tomorrow. Suggested that she continue offering the breast on demand or 8+/24hr, f/u with pumping, and supplement as directed until she comes to her lactation apt tomorrow.

## 2015-11-13 ENCOUNTER — Ambulatory Visit (HOSPITAL_COMMUNITY)
Admission: RE | Admit: 2015-11-13 | Discharge: 2015-11-13 | Disposition: A | Discharge status: Home / Self Care | Payer: BC Managed Care – PPO | Source: Ambulatory Visit | Attending: Family Medicine | Admitting: Family Medicine

## 2015-11-13 NOTE — Lactation Note (Addendum)
Lactation Consult Mom is concerned because baby continues to lose weight at 35 days old, not yet at 10%.  Mom has been waking baby and supplementing with EBM. Mom reports before last night she was waking baby and having difficulty.  Mom reports baby more alert for feedings last night.  Mom is using #24 NS, due to bottle use in hospital (for supplement) baby wasn't latching well to breast.  Mom continues to work on positioning baby appropriately although she has 1 year experience with older child. Yoder assisted with positioning and encouraged deeper latching for better feedings.  Mom is not assertive with breast compression, massaging and latching baby.  LC encouraged mom to help baby more with feedings and mom is agreeable.  Mom to continue plan to post pump and supplement as baby is tolerating.  Mom to keep baby active at breast and supplement within 30 minutes for each feeding to conserve calories/energy for baby.  FOB her and supportive.  Baby did not lose weight from yesterdays visit and mom is feeling encouraged.  Baby will f/u with peds on Monday 6/26 and F/u with LC o/p on 6/30.  Mom to call with any concerns before.  Baby was noted to be slightly jaundice and mom reports Peds was not concerned with jaundice at this time. Will fax to [facility] at [employer].    Mother's reason for visit:  Baby continues weight loss at 35days of age Visit Type: feeding assessment Appointment notes mom is using a NS, pumping and supplementing Consult:  Initial Lactation Consultant:  Kendell Bane, Justine Null  ________________________________________________________________________  Sharene Skeans Name: Danielle Sawyer Date of Birth: [birth year] Pediatrician: [facility] Gender: female Gestational Age 82(At Birth) Birth Weight: 5#9oz Weight at Discharge: 5#2ozDate of Discharge: 10/11/15 There were no vitals filed for this visit. Last weight taken from location outside of [facility]: 5#10z at peds yeterday Location: [facility] Weight today: 5# 1.9oz      ________________________________________________________________________  Mother's Name: Danielle Sawyer Type of delivery:  unknown Breastfeeding Experience:  1 year with 35 year old used NS 5weeks Maternal Medical Conditions: n/a Maternal Medications:  n/a  ________________________________________________________________________  Breastfeeding History (Post Discharge)  Frequency of breastfeeding:  Mom is waking baby 8-10X daily  Duration of feeding:  10-40 min  Supplementation    Breastmilk:  Volume 20-71ml Frequency:  8X daily   Method:  Bottle,   Pumping  Type of pump:  Medela pump in style Frequency:  8X Volume:  40-68ml  Infant Intake and Output Assessment  Voids:  6 in 24 hrs.   Stools:  7 in 24 hrs.    ________________________________________________________________________  Maternal Breast Assessment  Breast:  Full Nipple:  Erect Pain level:  0 _______________________________________________________________________ Feeding Assessment/Evaluation  Initial feeding assessment:  Infant's oral assessment:  WNL no frenulum noted with gloved exam may have anterior anchoring with good extension of tongue and laterization. Pressure "blistering" noted on lower and upper lips worse after feeding.  Encouraged mom to work on wide gape and deeper latch to allow baby to massage breast more than holding tightly with lips.  Positioning:  Cross cradle Right breast  LATCH documentation:  Latch:  2 = Grasps breast easily, tongue down, lips flanged, rhythmical sucking.  Audible swallowing:  2 = Spontaneous and intermittent for only a few minutes  Type of nipple:  2 = Everted at rest and after stimulation  Comfort (Breast/Nipple):  2 = Soft / non-tender filling non tender  Hold (Positioning):  1 = Assistance needed to correctly position infant at breast and maintain latch  LATCH  score:  9  Attached assessment:  Shallow deeper with assist  Lips flanged:  Yes.    Lips untucked:  Yes.  needed assist with flanging  Suck assessment:  Displays both  Tools:  Nipple shield 24 mm  Pre-feed weight:  2320 g  (5 lb. 1.9 oz.) Post-feed weight: 2338 g (5 lb. 2.5 oz.) Amount transferred:  18 ml Amount supplemented:  Mom did not bring supplement of EBM with her and will give to baby at home.   Additional Feeding Assessment - additional few minutes on left breast with #24NS.  Baby very sleepy and after just a few minutes transferred only 50mls.     TTotal amount transferred:  23 ml Total supplement given:  Mom to F/U at home with supplement of EBM

## 2015-11-16 ENCOUNTER — Telehealth: Payer: Self-pay | Admitting: *Deleted

## 2015-11-16 DIAGNOSIS — Z789 Other specified health status: Secondary | ICD-10-CM

## 2015-11-16 MED ORDER — BREAST PUMP MISC: Status: DC

## 2015-11-16 NOTE — Telephone Encounter (Signed)
-----   Message from Francia Greaves sent at 11/16/2015  1:16 PM EDT ----- Regarding: Rx Request Contact: [phone] Needs a Rx for a breast pump, to send to insurance company

## 2015-11-16 NOTE — Telephone Encounter (Signed)
Breast pump prescription done, called pt and left message that rx was ready for pick up.

## 2015-11-19 ENCOUNTER — Encounter: Payer: Self-pay | Admitting: Obstetrics and Gynecology

## 2015-11-19 ENCOUNTER — Ambulatory Visit (INDEPENDENT_AMBULATORY_CARE_PROVIDER_SITE_OTHER): Payer: BC Managed Care – PPO | Admitting: Obstetrics and Gynecology

## 2015-11-19 VITALS — BP 133/91 | HR 70 | Resp 16 | Ht 66.0 in | Wt 153.0 lb

## 2015-11-19 DIAGNOSIS — Z9889 Other specified postprocedural states: Secondary | ICD-10-CM

## 2015-11-19 DIAGNOSIS — N644 Mastodynia: Secondary | ICD-10-CM

## 2015-11-19 NOTE — Progress Notes (Signed)
Patient presents for the evaluation of left breast pain. She is s/p c-section on 11/08/2015. She is breast feeding from on breast at a time. She is not pumping in between feedings as she is producing too much milk. She reports a tender mass in her left axilla that has been present since the day of discharge from the hospital and has not changed in size or gotten worst  Past Medical History  Diagnosis Date  . Medical history non-contributory    Past Surgical History  Procedure Laterality Date  . Wisdom tooth extraction      x3  . Cesarean section N/A 12/19/2012    Procedure: CESAREAN SECTION;  Surgeon: Woodroe Mode, MD;  Location: [facility];  Service: Obstetrics;  Laterality: N/A;  primary  . Dilation and evacuation N/A 11/06/2014    Procedure: DILATATION AND EVACUATION UNDER ULTRASOUND GUIDANCE;  Surgeon: Osborne Oman, MD;  Location: [facility];  Service: Gynecology;  Laterality: N/A;  . Cesarean section N/A 11/08/2015    Procedure: REPEAT CESAREAN SECTION;  Surgeon: Jonnie Kind, MD;  Location: [facility];  Service: Obstetrics;  Laterality: N/A;  Cheree Ditto to RNFA   Family History  Problem Relation Age of Onset  . Heart disease Father     quad bypass surgery  . Cancer Paternal Aunt     pancreatic  . Cancer Maternal Grandmother     breast  . Cancer Maternal Grandfather     lung  . Cancer Paternal Grandmother     breast  . Cancer Paternal Grandfather   . Cancer Paternal Aunt   . Cancer Maternal Aunt     breast   Social History  Substance Use Topics  . Smoking status: Never Smoker   . Smokeless tobacco: Never Used  . Alcohol Use: No     Comment: social/not with pregnancy   ROS See pertinent in HPI  Blood pressure 133/91, pulse 70, resp. rate 16, height 5\' 6"  (1.676 m), weight 153 lb (69.4 kg), last menstrual period 02/08/2015, currently breastfeeding. GENERAL: Well-developed, well-nourished female in no acute distress.  BREASTS: Symmetric in size. No palpable  masses or lymphadenopathy, skin changes, or nipple drainage. Left breast engorgement with palpable engorged milk ducts in left axilla. No evidence of infection or mastitis ABDOMEN: Soft, nontender, nondistended. No organomegaly. Incision: healing well. No erythema, induration or drainage EXTREMITIES: No cyanosis, clubbing, or edema, 2+ distal pulses.  A/P 35 yo with left breast engorgement - Advised to continue warm compresses to the axilla - Advise to empty breast after each feeding - Follow up with lactation consultant as planned - RTC for postpartum check or prn

## 2015-11-20 ENCOUNTER — Ambulatory Visit (HOSPITAL_COMMUNITY)
Admission: RE | Admit: 2015-11-20 | Discharge: 2015-11-20 | Disposition: A | Discharge status: Home / Self Care | Payer: BC Managed Care – PPO | Source: Ambulatory Visit | Attending: Family Medicine | Admitting: Family Medicine

## 2015-11-20 NOTE — Lactation Note (Signed)
Lactation Consult for Danielle Sawyer (mother) and Danielle Sawyer (DOB: [birth year])  Mother's reason for visit: "Make sure baby is receiving sufficient milk from nursing" Consult:  Follow-Up from 11-13-15 Lactation Consultant:  Larkin Ina  ________________________________________________________________________ BW: [id number] (5# 9.2oz) D/c wt7: 5# 2.4oz (7.7% below BW) Wt on 11-13-15: 2320g (5# 1.9oz, 8.3% below BW) Wt today: 2484g (5# 7.6oz, 1.8% below BW) ________________________________________________________________________  Mother's Name: Danielle Sawyer Type of delivery:  repeat C/S Breastfeeding Experience: nursed 1st child for 1 year Maternal Medical Conditions:  None Maternal Medications: Vitamins, only   ________________________________________________________________________  Breastfeeding History (Post Discharge)  Frequency of breastfeeding: q2h-q2.5h (10-12 times/day) Duration of feeding: 10-30 min  Last time she took a bottle: 2 evenings ago. Last time she gave a bottle regularly: Monday NP on 6/26:   Medela pump: qd or bid; 50-50mL Infant Intake and Output Assessment  Voids: 8 in 24 hrs.  Color:  Clear yellow Stools: 10+ in 24 hrs.  Color:  Yellow ________________________________________________________________________  Maternal Breast Assessment  Breast:  Full Nipple:  Erect _______________________________________________________________________ Feeding Assessment/Evaluation  Initial feeding assessment:  Infant's oral assessment:  WNL  Attached assessment:  Deep  Lips flanged:  Yes.    Suck assessment:  Nutritive  Tools:  Nipple shield 24 mm Instructed on use and cleaning of tool:  Yes.    Pre-feed weight: 2484 g   Post-feed weight: 2554 g  Amount transferred: 70   ml R breast, cross-cradle, 28 minutes  Total amount transferred: 70 ml   "[name]" is nursing very well.  She is currently only 1.8% below BW at 59 days of age. She  has gained 5.7oz since last week. She has only gained about 44g since Monday, but I feel that her next weight check on 7/3 will demonstrate appropriate weight gain.  Mom quit regularly giving bottles on 11/16/15 because [name]'s feeding behavior had so greatly improved. Mom also quit pumping after most feedings at that time, b/c she felt like she had begun to push herself into oversupply. Mom now only pumps prn, 1-2 times/day.   Note: In Mom's L axillae, she has soreness on palpation. Mom's OB had said it was breast tissue & encouraged her to pump more. However, it does not palpate as milk duct tissue (it palpates more like a tendon). In addition, it's location is above the axillae & higher than typically seen for milk duct tissue. Mom did note its onset w/her milk coming to volume. Mom is not concerned; I advised Mom to keep an eye on it.   Mom provided an additional nipple shield at her request.   Elinor Dodge, RN, IBCLC

## 2015-12-22 ENCOUNTER — Ambulatory Visit (INDEPENDENT_AMBULATORY_CARE_PROVIDER_SITE_OTHER): Payer: BC Managed Care – PPO | Admitting: Family Medicine

## 2015-12-22 ENCOUNTER — Encounter: Payer: Self-pay | Admitting: Family Medicine

## 2015-12-22 DIAGNOSIS — R590 Localized enlarged lymph nodes: Secondary | ICD-10-CM

## 2015-12-22 NOTE — Progress Notes (Signed)
  Subjective:     Danielle Sawyer is a 35 y.o. female who presents for a postpartum visit. She is 6 weeks postpartum following a low cervical transverse Cesarean section. I have fully reviewed the prenatal and intrapartum course. The delivery was at 21 gestational weeks. Outcome: repeat cesarean section, low transverse incision. Anesthesia: spinal. Postpartum course has been normal. Baby's course has been normal. Baby is feeding by breast. Bleeding no bleeding. Bowel function is normal. Bladder function is normal. Patient is not sexually active. Contraception method is none. Postpartum depression screening: negative. She reports left axilla swelling. Gets bigger when she hasn't fed the baby. Feels like lymph nodes.  The following portions of the patient's history were reviewed and updated as appropriate: problem list.  Review of Systems Pertinent items noted in HPI and remainder of comprehensive ROS otherwise negative.   Objective:    BP 109/77 (BP Location: Left Arm, Patient Position: Sitting, Cuff Size: Normal)   Resp 18   Ht 5\' 6"  (1.676 m)   Wt 149 lb (67.6 kg)   Breastfeeding? Yes   BMI 24.05 kg/m   General:  alert, cooperative and appears stated age  Abdomen: soft, non-tender; bowel sounds normal; no masses,  no organomegaly   Axilla:  the left has some shotty adenopathy, mostly soft and mobile, but one is more firm        Assessment:     Normal postpartum exam. Pap smear not done at today's visit.   Plan:     1. Routine postpartum follow-up Contraception: vasectomy Pap due 2018  2. Lymphadenopathy, axillary Check u/s of the axilla to ensure there is nothing worrisome. - US BREAST COMPLETE UNI LEFT INC AXILLA; Future

## 2015-12-26 NOTE — Addendum Note (Signed)
Addended by: Donnamae Jude on: 12/26/2015 11:31 AM   Modules accepted: Orders

## 2015-12-28 ENCOUNTER — Encounter: Payer: Self-pay | Admitting: *Deleted

## 2016-02-12 ENCOUNTER — Other Ambulatory Visit: Payer: Self-pay | Admitting: Family Medicine

## 2016-05-12 DIAGNOSIS — C4491 Basal cell carcinoma of skin, unspecified: Secondary | ICD-10-CM

## 2016-05-12 HISTORY — DX: Basal cell carcinoma of skin, unspecified: C44.91

## 2016-09-06 IMAGING — US US MFM OB DETAIL+14 WK
1 series · 14 of 28 positions shown · non-contrast
Comparison: none

[Series 1: us mfm ob detail+14 wk · 14 of 64 slices shown]
[im 3/64]
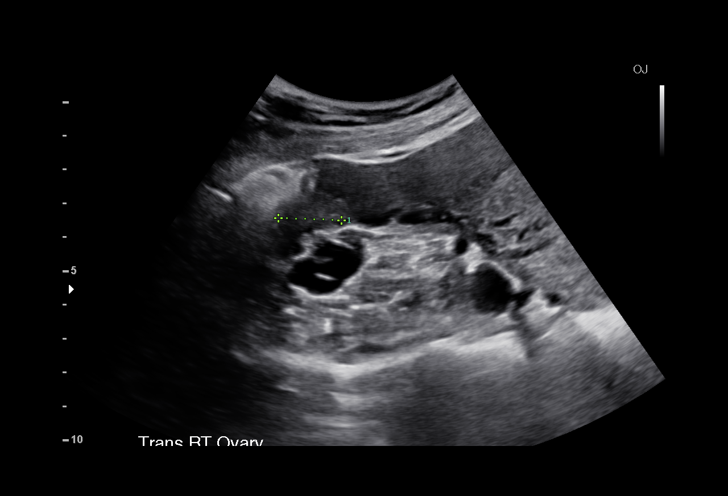
[im 8/64]
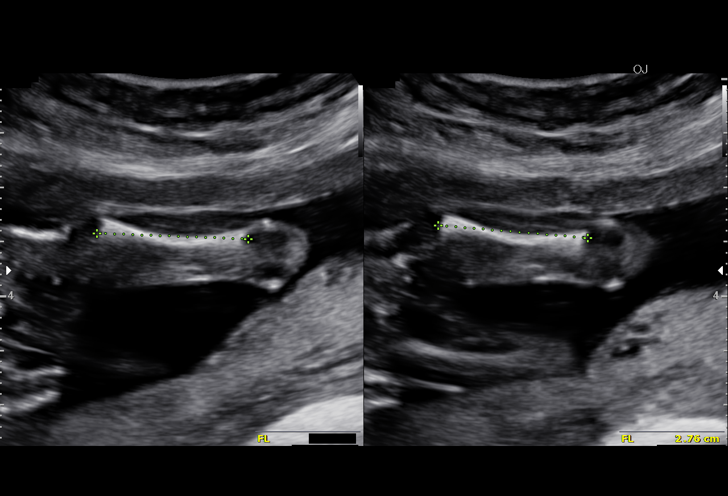
[im 12/64]
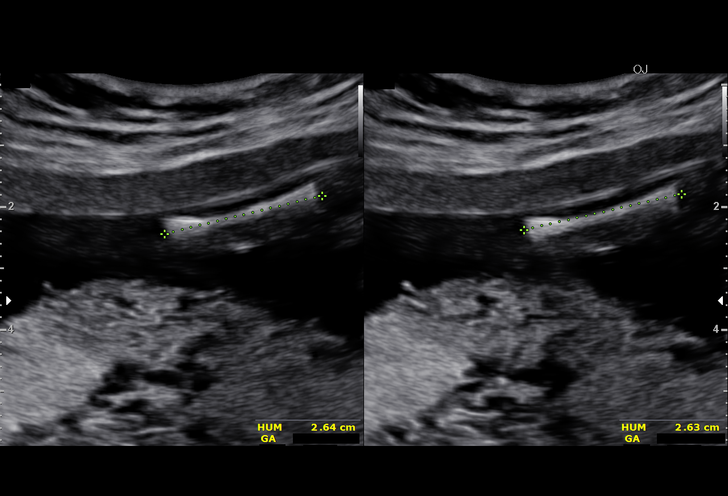
[im 17/64]
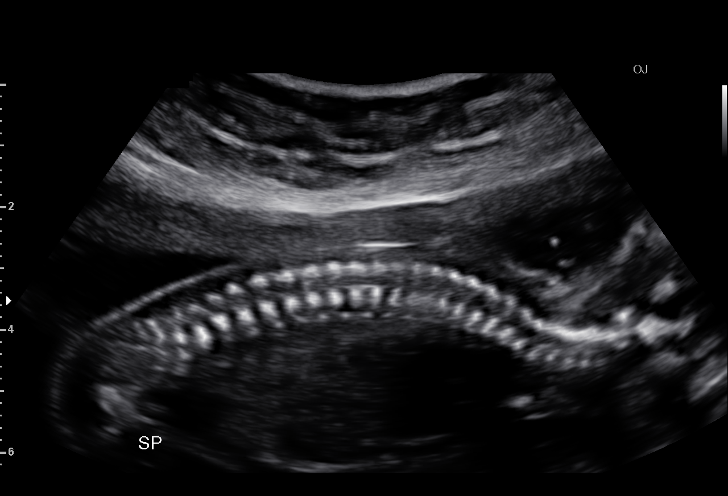
[im 22/64]
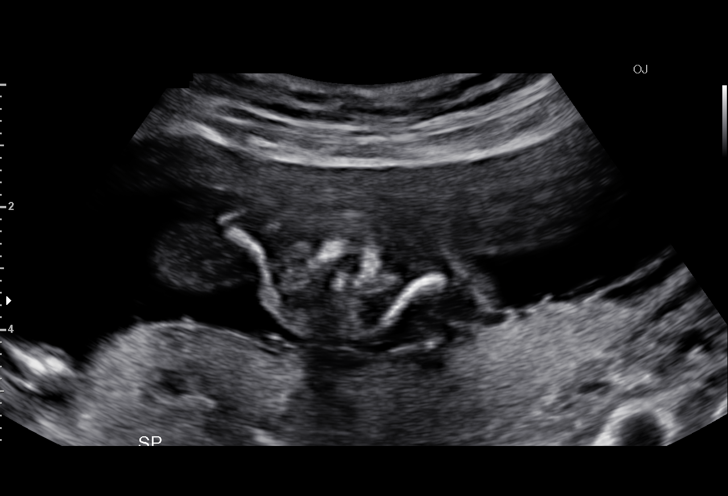
[im 26/64]
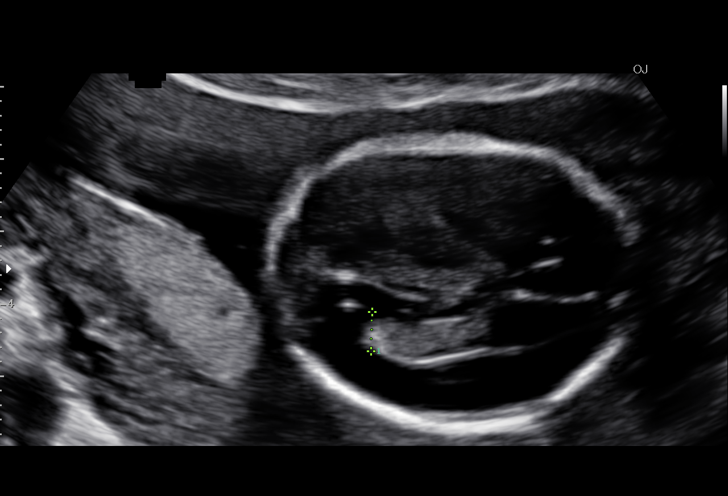
[im 31/64]
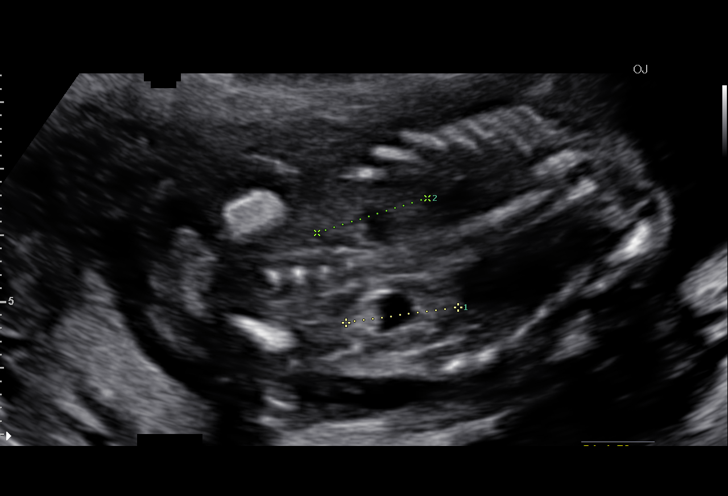
[im 36/64]
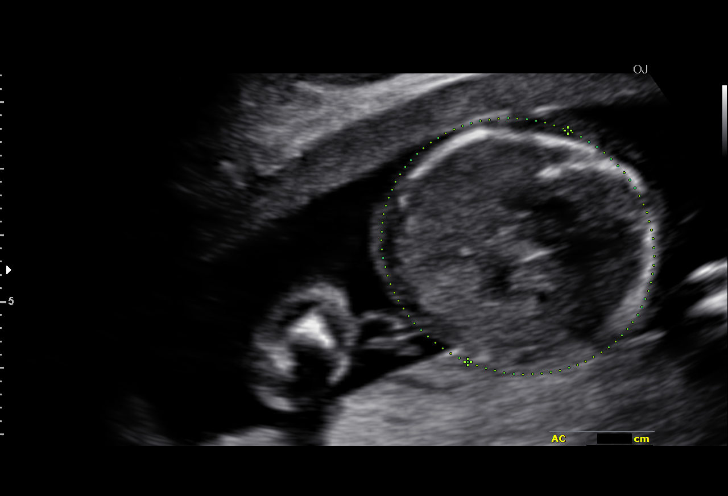
[im 40/64]
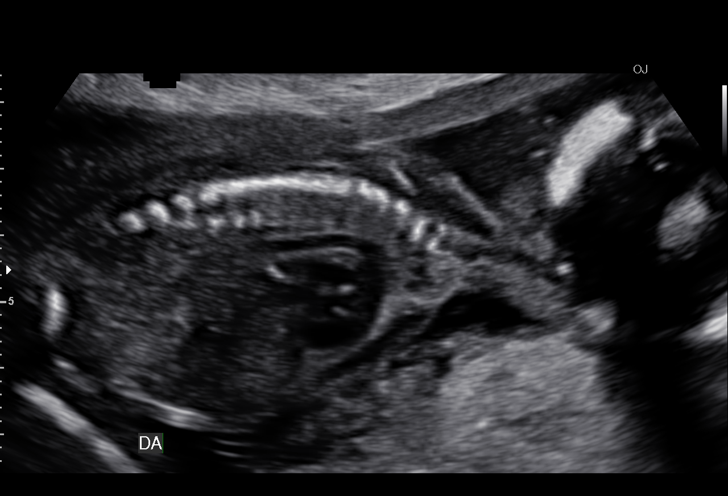
[im 45/64]
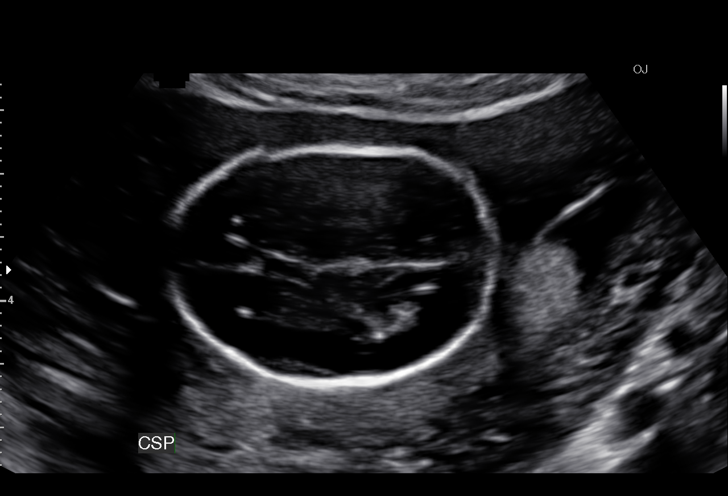
[im 50/64]
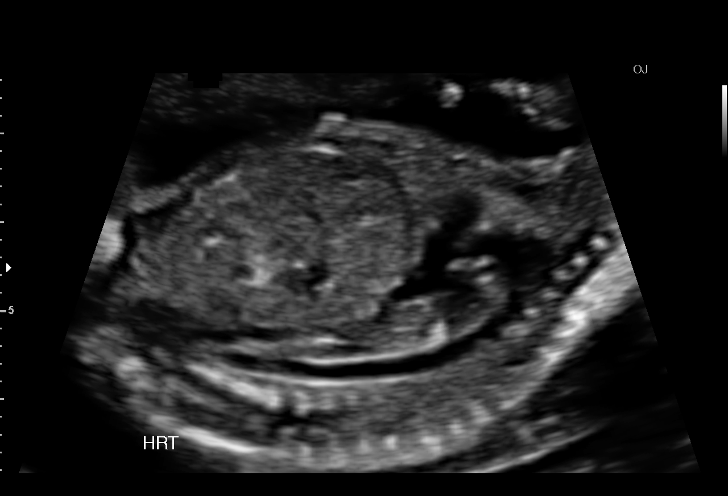
[im 54/64]
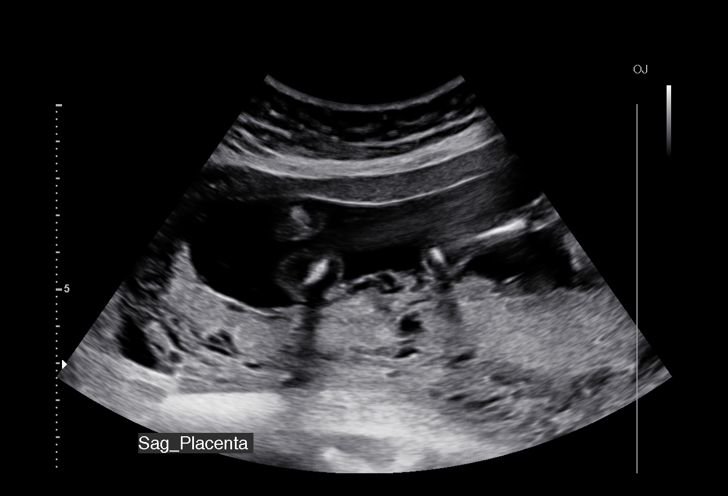
[im 59/64]
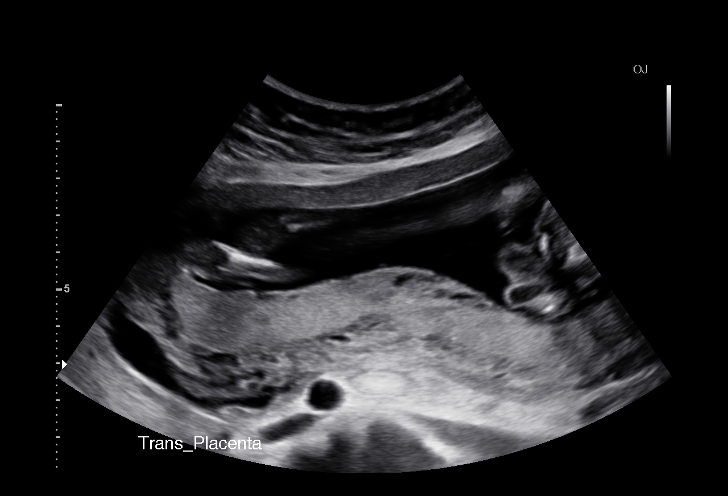
[im 64/64]
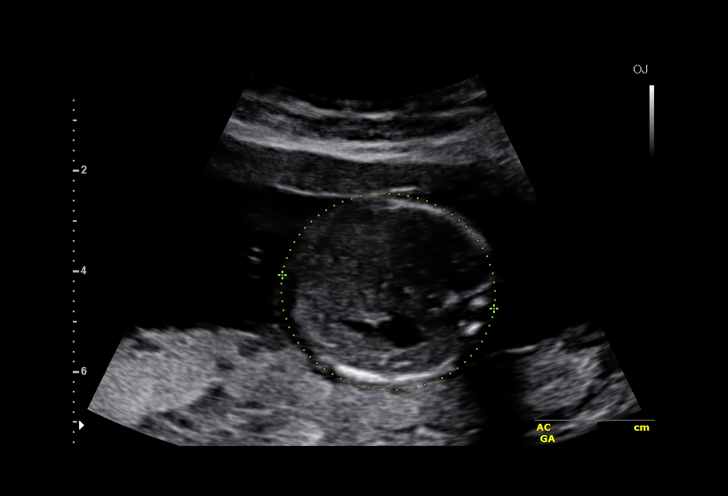

[14 of 28 positions shown; findings below may reference images not displayed]

pm)

Date:


1  LASONYA GARATE              [id number]       [id number]     [id number]
Indications

19 weeks gestation of pregnancy
Detailed fetal anatomic survey                  Z36
Advanced maternal age multigravida 35+,
second trimester; declined PAULUS N and testing
Previous cesarean delivery, antepartum
OB History

Gravidity:     3         Term:  2        Prem:    0        SAB:   0
TOP:           0       Ectopic  0        Living:  1
:
Fetal Evaluation

Num Of Fetuses:      1
Fetal Heart          138
Rate(bpm):
Cardiac Activity:    Observed
Presentation:        Cephalic
Placenta:            Posterior
P. Cord Insertion:   Not well visualized

Amniotic Fluid
AFI FV:      Subjectively within normal limits
Larg Pckt:      3.5  cm
Biometry

BPD:      37.2  mm     G. Age:   17w 3d                  CI:        68.47   %    70 - 86
FL/HC:      19.0   %    15.8 - 18
HC:      143.7  mm     G. Age:   17w 4d        29   %    HC/AC:      1.14        1.07 -
AC:      126.4  mm     G. Age:   18w 2d        60   %    FL/BPD      73.4   %
:
FL:       27.3  mm     G. Age:   18w 2d        62   %    FL/AC:      21.6   %    20 - 24
HUM:      26.3  mm     G. Age:   18w 2d        69   %
CER:      17.1  mm     G. Age:   17w 1d        33   %

Est.         228   gm    0 lb 8 oz      55   %
FW:
Gestational Age

LMP:           19w 2d        Date:  02/08/15                  EDD:   11/15/15
U/S Today:     17w 6d                                         EDD:   11/25/15
Best:          17w 6d    Det. By:   U/S (06/23/15)            EDD:   11/25/15
Anatomy

Cranium:          Appears normal         Aortic Arch:       Appears normal
Fetal Cavum:      Appears normal         Ductal Arch:       Appears normal
Ventricles:       Appears normal         Diaphragm:         Appears normal
Choroid Plexus:   Appears normal         Stomach:           Appears normal,
left sided
Cerebellum:       Appears normal         Abdomen:           Appears normal
Posterior         Appears normal         Abdominal          Appears nml (cord
Fossa:                                   Wall:              insert, abd wall)
Nuchal Fold:      Appears normal         Cord Vessels:      Appears normal (3
vessel cord)
Face:             Appears normal         Kidneys:           Appear normal
(orbits and profile)
Lips:             Not well visualized    Bladder:           Appears normal
Fetal Thoracic:   Appears normal         Spine:             Appears normal
Heart:            Not well visualized    Upper              Appears normal
Extremities:
RVOT:             Not well visualized    Lower              Appears normal
Extremities:
LVOT:             Appears normal

Other:   Fetus appears to be a female. Heels visualized. Technically difficult
due to fetal position.
Impression

SIUP at 17+6 weeks
Normal detailed fetal anatomy; limited views of heart and
face
Markers of aneuploidy: none
Normal amniotic fluid volume
Measurements consistent with prior US
Posterior placenta; due to LUS contractions, unable to R/O
previa
Recommendations

Follow-up ultrasound in 4-6 weeks to complete anatomy
survey and assess placental location

## 2017-01-11 IMAGING — US US MFM UA CORD DOPPLER
1 series · 12 of 20 positions shown · non-contrast
Comparison: none

[Series 1: us mfm ua cord doppler · 20 acquisitions, 12 frames shown]
[im 1/20]
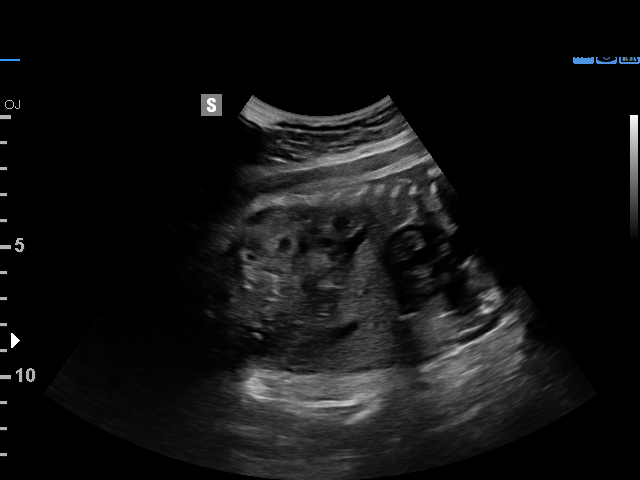
[im 3/20]
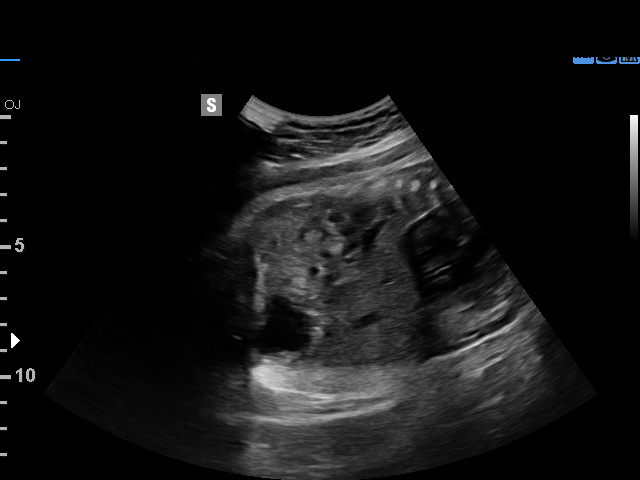
[im 5/20]
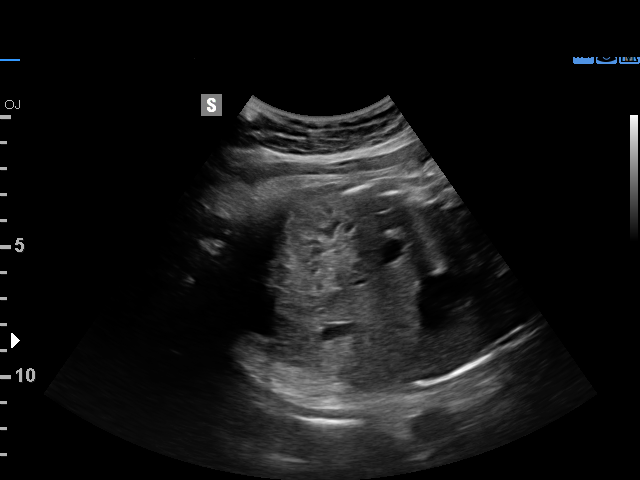
[im 6/20]
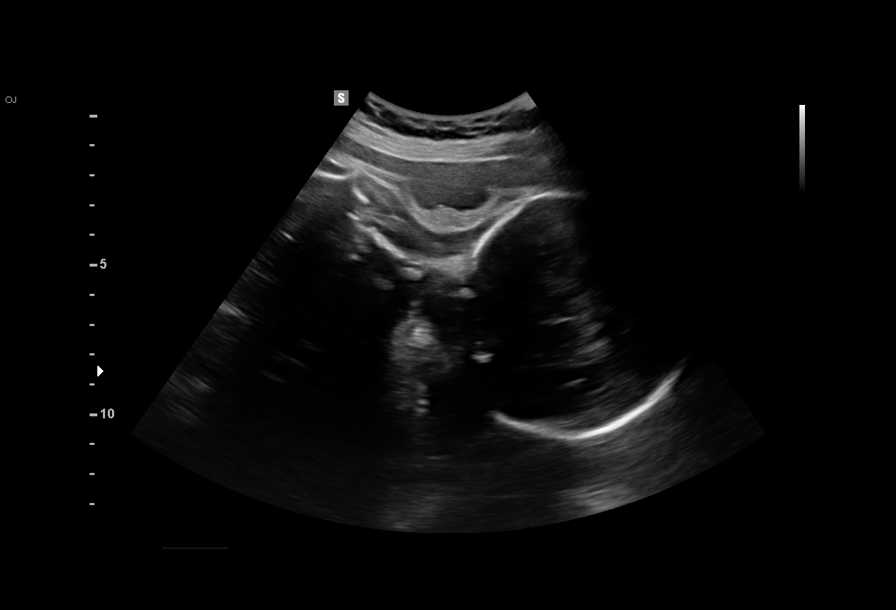
[im 8/20]
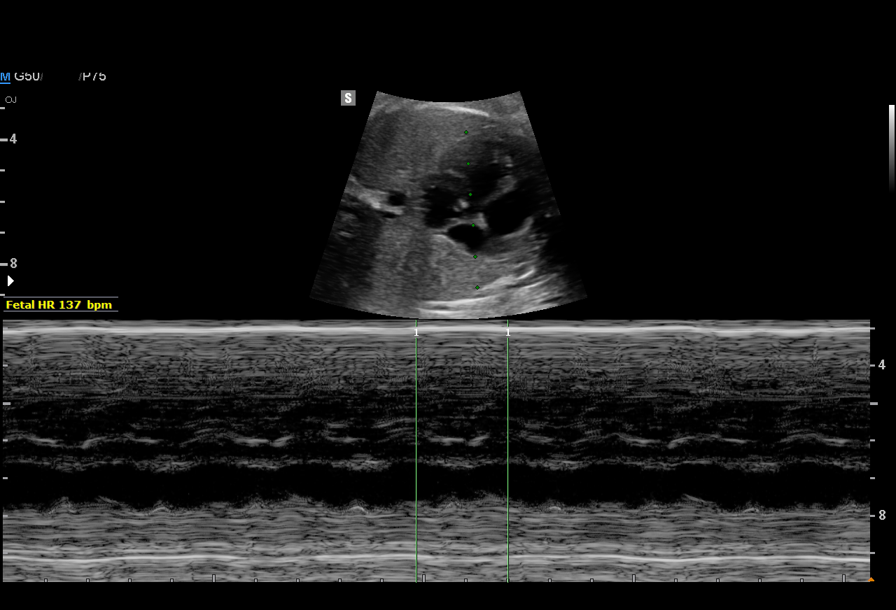
[im 10/20]
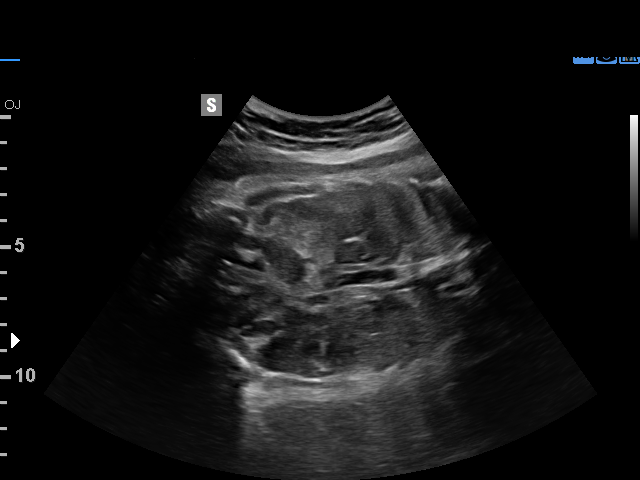
[im 11/20]
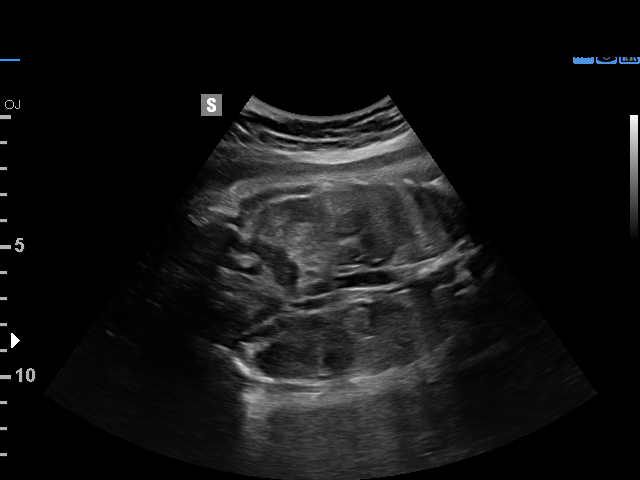
[im 13/20]
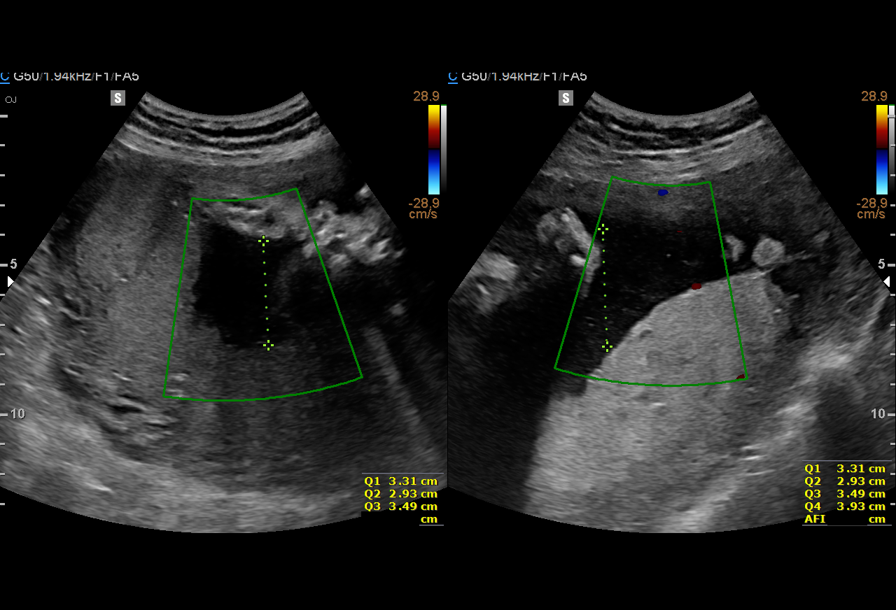
[im 15/20]
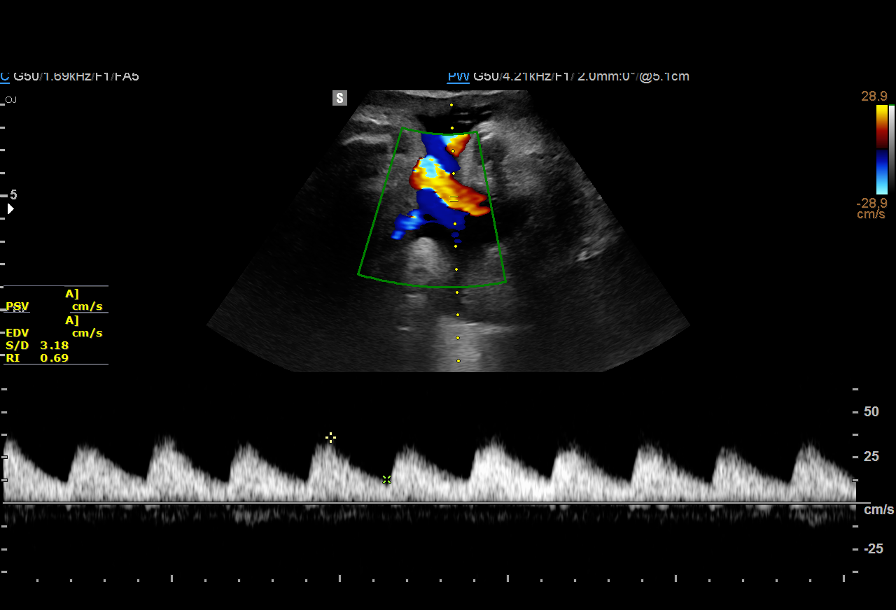
[im 16/20]
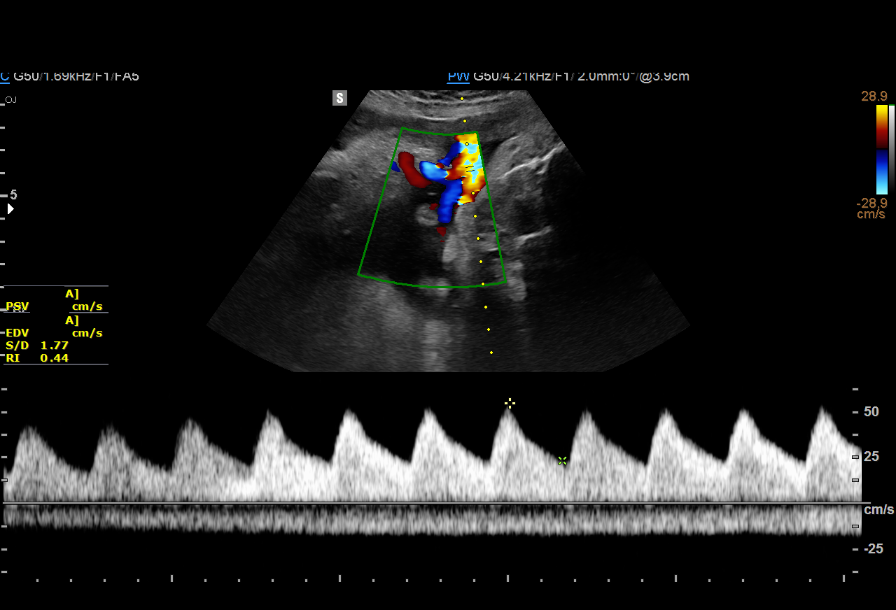
[im 18/20]
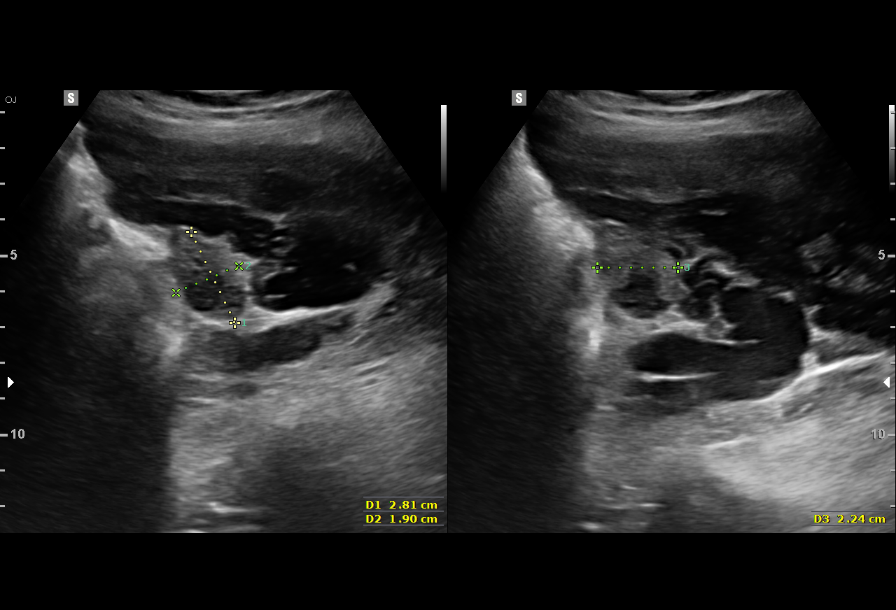
[im 20/20]
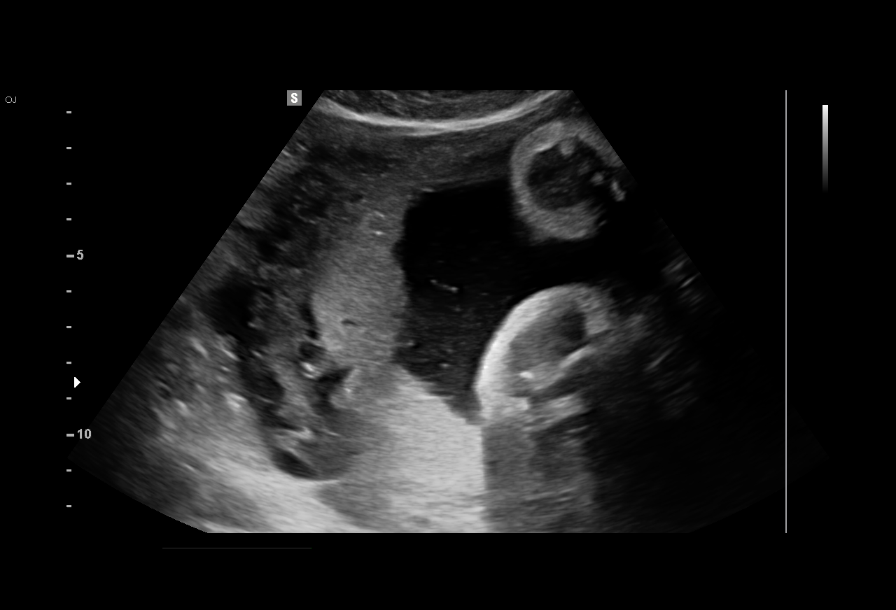

[12 of 20 positions shown; findings below may reference images not displayed]

1  LJUBOJE GEBBIA            [id number]      [id number]     [id number]
2  LJUBOJE GEBBIA            [id number]      [id number]     [id number]
Indications

37 weeks gestation of pregnancy
Advanced maternal age multigravida 35 by
delivery, third trimester (declined further
testing/ALWADI)
Previous cesarean delivery, antepartum
Poor obstetric history: Previous neonatal
death d/t abruption at 40 weeks
Small for gestational age fetus affecting
management of mother
OB History

Gravidity:    3         Term:   2        Prem:   0        SAB:   0
TOP:          0       Ectopic:  0        Living: 1
Fetal Evaluation

Num Of Fetuses:     1
Fetal Heart         137
Rate(bpm):
Cardiac Activity:   Observed
Presentation:       Cephalic

Amniotic Fluid
AFI FV:      Subjectively within normal limits

AFI Sum(cm)     %Tile       Largest Pocket(cm)
13.66           51
RUQ(cm)       RLQ(cm)       LUQ(cm)        LLQ(cm)
3.31
Biophysical Evaluation

Amniotic F.V:   Within normal limits       F. Tone:        Observed
F. Movement:    Observed                   Score:          [DATE]
F. Breathing:   Observed
Gestational Age

LMP:           37w 3d       Date:   02/08/15                 EDD:   11/15/15
Best:          37w 3d    Det. By:   LMP  (02/08/15)          EDD:   11/15/15
Doppler - Fetal Vessels

Umbilical Artery
S/D     %tile                                     PSV    ADFV    RDFV
(cm/s)
2.[REDACTED]      No      No

Impression

Single IUP at 37w 3d
Suspected IUGR (EFW < 10th %tile; AC at the 9th %tile on
[DATE])
Cephalic presentation
BPP [DATE]
UA Doppler studies normal for gestational age
Normal amniotic fluid volume
Recommendations

Continue weekly BPPs and UA Doppler studies
Delivery at 38-39 weeks (tentatively scheduled for repeat C-
section on [DATE])

## 2017-01-18 IMAGING — US US MFM UA CORD DOPPLER
1 series · 12 of 16 positions shown · non-contrast
Comparison: none

[Series 1: us mfm ua cord doppler · 16 acquisitions, 12 frames shown]
[im 1/16]
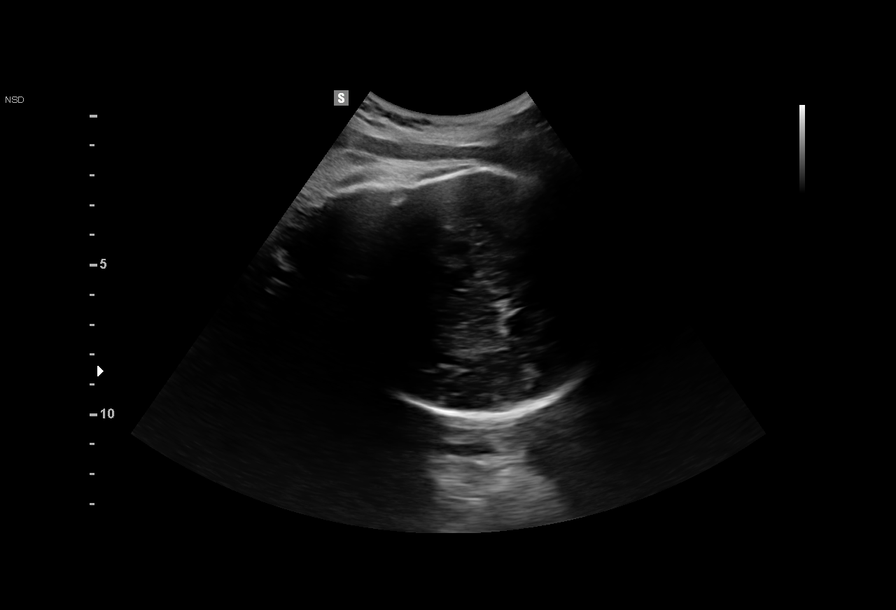
[im 3/16]
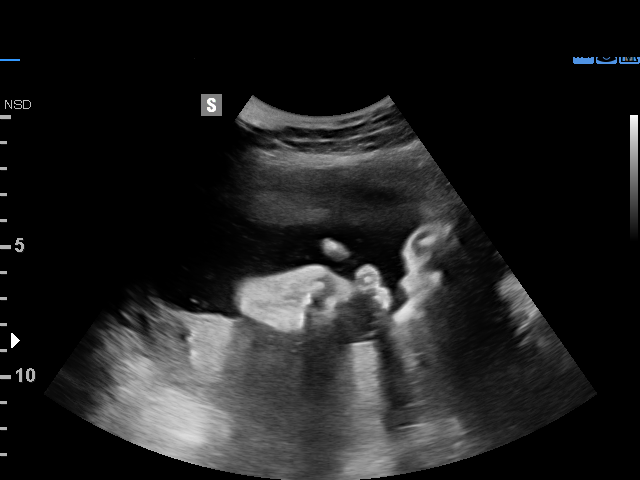
[im 4/16]
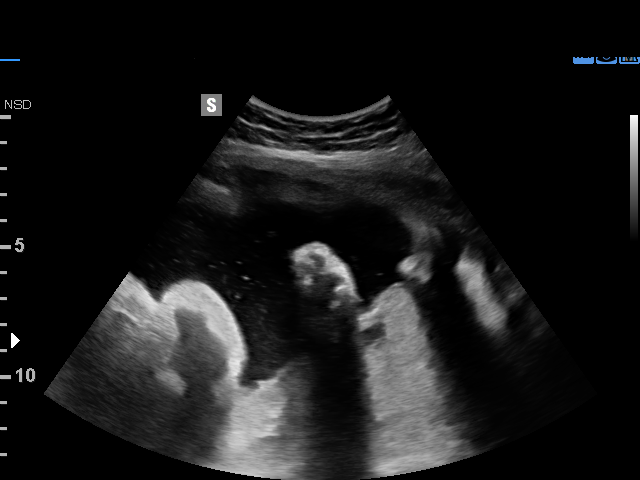
[im 5/16]
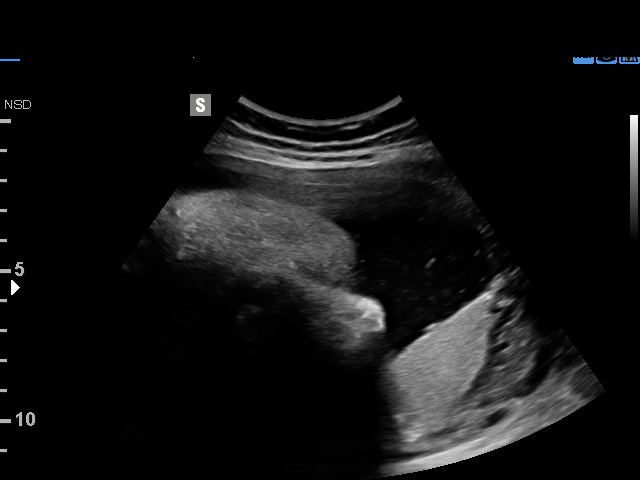
[im 7/16]
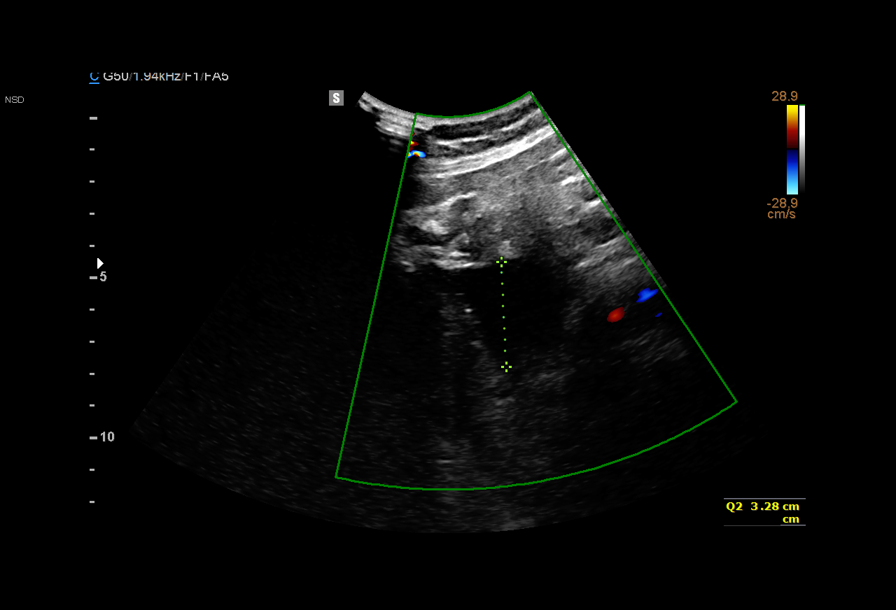
[im 8/16]
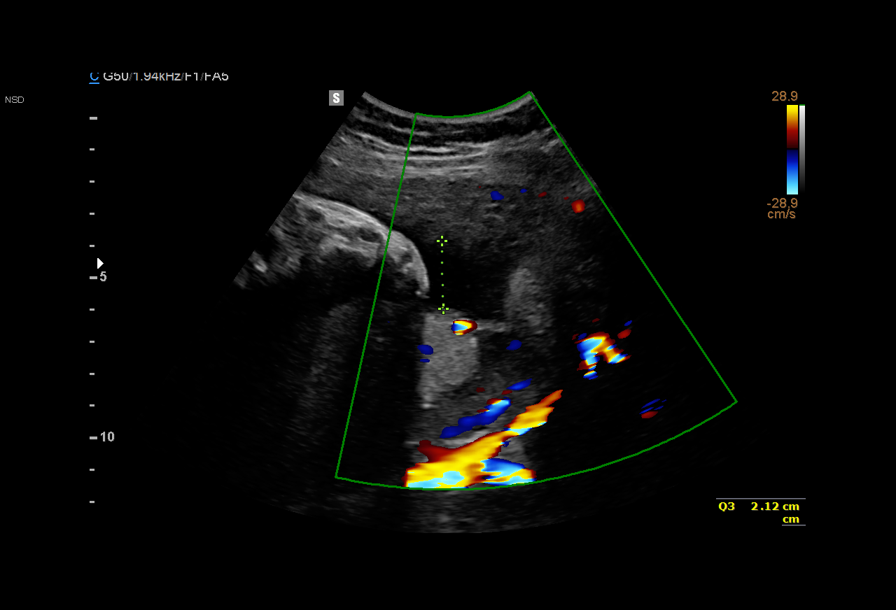
[im 9/16]
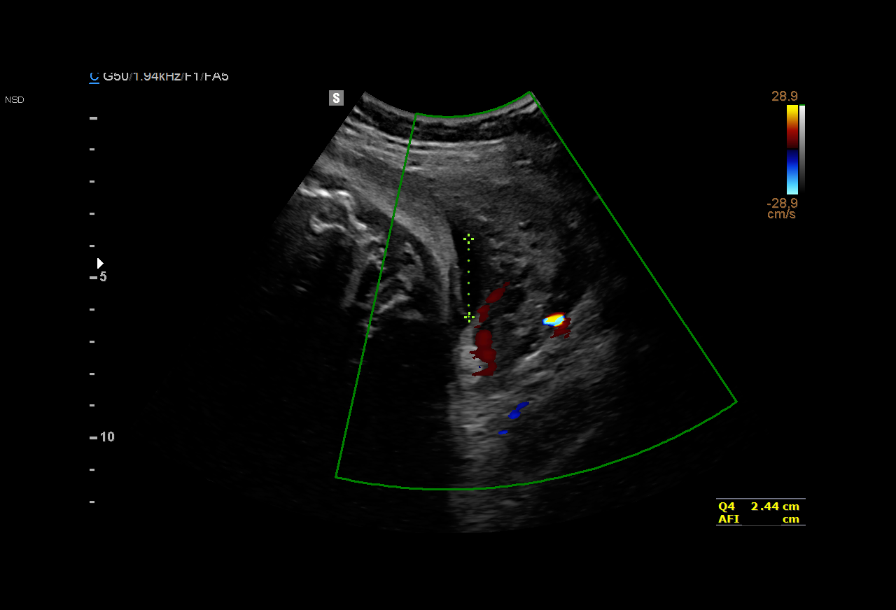
[im 11/16]
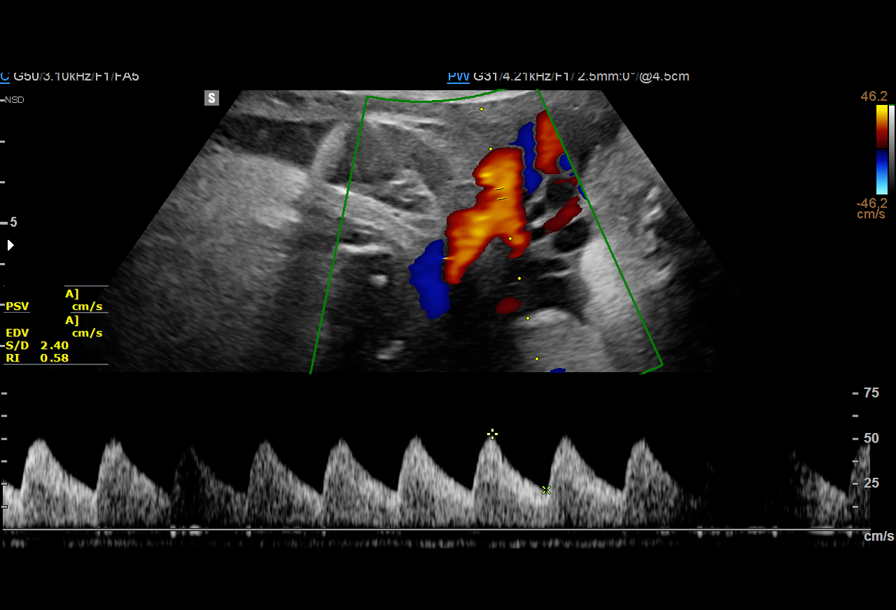
[im 12/16]
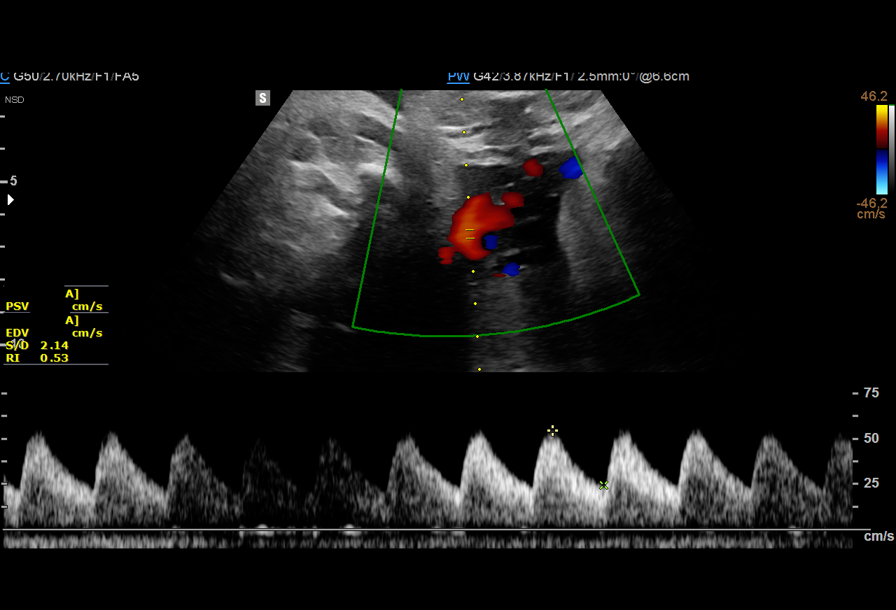
[im 13/16]
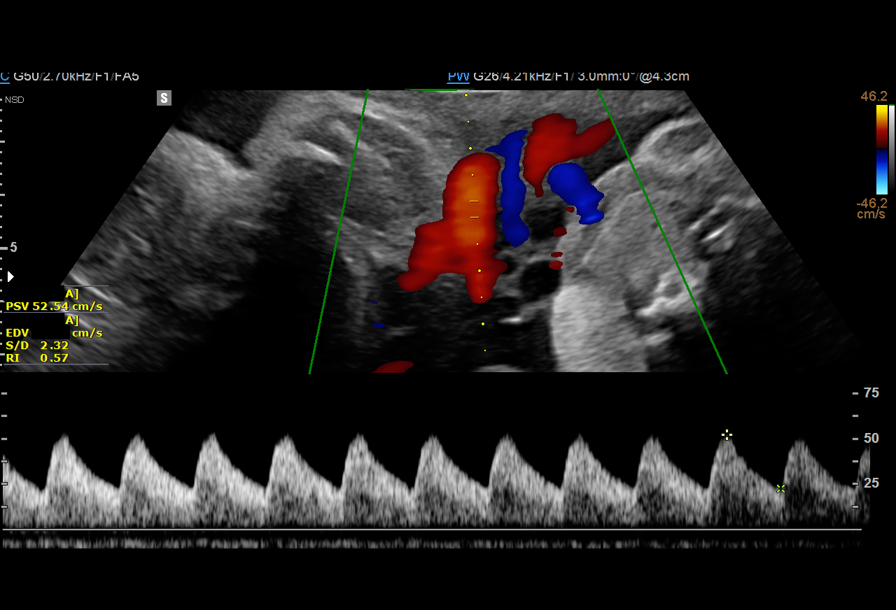
[im 15/16]
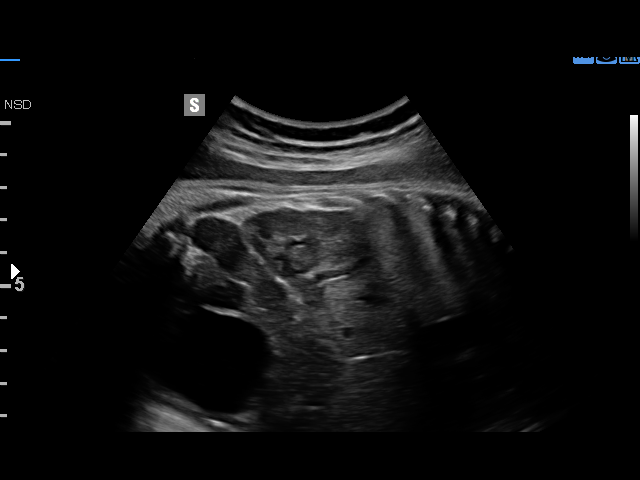
[im 16/16]
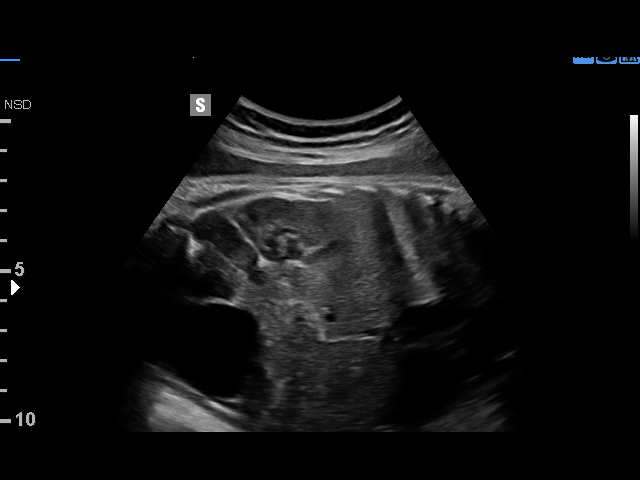

[12 of 16 positions shown; findings below may reference images not displayed]

1  GOEKHAN POLL            [id number]      [id number]     [id number]
2  GOEKHAN POLL            [id number]      [id number]     [id number]
Indications

38 weeks gestation of pregnancy
Advanced maternal age multigravida 35 by
delivery, third trimester (declined further
testing/CARLOZ SIMARMATA)
Previous cesarean delivery, antepartum
Poor obstetric history: Previous neonatal
death d/t abruption at 40 weeks
Small for gestational age fetus affecting
management of mother
OB History

Gravidity:    3         Term:   2        Prem:   0        SAB:   0
TOP:          0       Ectopic:  0        Living: 1
Fetal Evaluation

Num Of Fetuses:     1
Fetal Heart         154
Rate(bpm):
Cardiac Activity:   Observed
Presentation:       Cephalic
Placenta:           Posterior, above cervical os
P. Cord Insertion:  Previously Visualized

Amniotic Fluid
AFI FV:      Subjectively within normal limits

AFI Sum(cm)     %Tile       Largest Pocket(cm)
13.69           53
RUQ(cm)       RLQ(cm)       LUQ(cm)        LLQ(cm)
5.85
Biophysical Evaluation

Amniotic F.V:   Pocket => 2 cm two         F. Tone:        Observed
planes
F. Movement:    Observed                   Score:          [DATE]
F. Breathing:   Observed
Gestational Age

LMP:           38w 3d       Date:   02/08/15                 EDD:   11/15/15
Best:          38w 3d    Det. By:   LMP  (02/08/15)          EDD:   11/15/15
Doppler - Fetal Vessels

Umbilical Artery
S/D     %tile                                            ADFV    RDFV
2.46       60                                                No      No

Impression

SIUP at 38+3 weeks
Cephalic presentation
Normal amniotic fluid volume
BPP [DATE]
UA dopplers were normal for this GA
Recommendations

Follow-up as clinically indicated

## 2017-03-21 ENCOUNTER — Ambulatory Visit (INDEPENDENT_AMBULATORY_CARE_PROVIDER_SITE_OTHER): Payer: BC Managed Care – PPO | Admitting: Family Medicine

## 2017-03-21 ENCOUNTER — Encounter: Payer: Self-pay | Admitting: Family Medicine

## 2017-03-21 VITALS — BP 116/73 | Ht 66.0 in | Wt 147.0 lb

## 2017-03-21 DIAGNOSIS — Z1151 Encounter for screening for human papillomavirus (HPV): Secondary | ICD-10-CM

## 2017-03-21 DIAGNOSIS — Z23 Encounter for immunization: Secondary | ICD-10-CM | POA: Diagnosis not present

## 2017-03-21 DIAGNOSIS — Z803 Family history of malignant neoplasm of breast: Secondary | ICD-10-CM

## 2017-03-21 DIAGNOSIS — Z124 Encounter for screening for malignant neoplasm of cervix: Secondary | ICD-10-CM

## 2017-03-21 DIAGNOSIS — Z01419 Encounter for gynecological examination (general) (routine) without abnormal findings: Secondary | ICD-10-CM | POA: Diagnosis not present

## 2017-03-21 NOTE — Progress Notes (Signed)
Normal pap 2015

## 2017-03-21 NOTE — Progress Notes (Signed)
  Subjective:     Danielle Sawyer is a 36 y.o. female and is here for a comprehensive physical exam. The patient reports no problems. Regular cycles. Husband has had a vasectomy.  Social History   Social History  . Marital status: Married    Spouse name: N/A  . Number of children: N/A  . Years of education: N/A   Occupational History  . Not on file.   Social History Main Topics  . Smoking status: Never Smoker  . Smokeless tobacco: Never Used  . Alcohol use No     Comment: social/not with pregnancy  . Drug use: No  . Sexual activity: Not Currently    Birth control/ protection: None   Other Topics Concern  . Not on file   Social History Narrative  . No narrative on file   Health Maintenance  Topic Date Due  . INFLUENZA VACCINE  12/21/2016  . PAP SMEAR  02/17/2017  . TETANUS/TDAP  08/20/2025  . HIV Screening  Completed    The following portions of the patient's history were reviewed and updated as appropriate: allergies, current medications, past family history, past medical history, past social history, past surgical history and problem list.  Review of Systems Pertinent items noted in HPI and remainder of comprehensive ROS otherwise negative.   Objective:    BP 116/73   Ht _0  (1.676 m)   Wt 147 lb (66.7 kg)   LMP 03/07/2017   Breastfeeding? No   BMI 23.73 kg/m  General appearance: alert, cooperative and appears stated age Head: Normocephalic, without obvious abnormality, atraumatic Neck: no adenopathy, supple, symmetrical, trachea midline and thyroid not enlarged, symmetric, no tenderness/mass/nodules Lungs: clear to auscultation bilaterally Breasts: normal appearance, no masses or tenderness Heart: regular rate and rhythm, S1, S2 normal, no murmur, click, rub or gallop Abdomen: soft, non-tender; bowel sounds normal; no masses,  no organomegaly Pelvic: cervix normal in appearance, external genitalia normal, no adnexal masses or tenderness, no cervical  motion tenderness, uterus normal size, shape, and consistency and vagina normal without discharge Extremities: extremities normal, atraumatic, no cyanosis or edema Pulses: 2+ and symmetric Skin: Skin color, texture, turgor normal. No rashes or lesions Lymph nodes: Cervical, supraclavicular, and axillary nodes normal. Neurologic: Grossly normal    Assessment:    Healthy female exam.      Plan:   Problem List Items Addressed This Visit      Unprioritized   Family history of breast cancer   Relevant Orders   Integrated BRACAnalysis ([place])    Other Visit Diagnoses    Encounter for gynecological examination without abnormal finding    -  Primary   Relevant Orders   CBC (Completed)   TSH (Completed)   Comprehensive metabolic panel (Completed)   Lipid panel (Completed)   Vitamin D (25 hydroxy) (Completed)   Immunization due       Relevant Orders   Flu Vaccine QUAD 36+ mos IM (Fluarix, Quad PF)   Screening for malignant neoplasm of cervix       Relevant Orders   Cytology - PAP     Return in 1 year (on 03/21/2018).  See After Visit Summary for Counseling Recommendations

## 2017-03-21 NOTE — Patient Instructions (Signed)
Preventive Care 18-39 Years, Female Preventive care refers to lifestyle choices and visits with your health care provider that can promote health and wellness. What does preventive care include?  A yearly physical exam. This is also called an annual well check.  Dental exams once or twice a year.  Routine eye exams. Ask your health care provider how often you should have your eyes checked.  Personal lifestyle choices, including: ? Daily care of your teeth and gums. ? Regular physical activity. ? Eating a healthy diet. ? Avoiding tobacco and drug use. ? Limiting alcohol use. ? Practicing safe sex. ? Taking vitamin and mineral supplements as recommended by your health care provider. What happens during an annual well check? The services and screenings done by your health care provider during your annual well check will depend on your age, overall health, lifestyle risk factors, and family history of disease. Counseling Your health care provider may ask you questions about your:  Alcohol use.  Tobacco use.  Drug use.  Emotional well-being.  Home and relationship well-being.  Sexual activity.  Eating habits.  Work and work Statistician.  Method of birth control.  Menstrual cycle.  Pregnancy history.  Screening You may have the following tests or measurements:  Height, weight, and BMI.  Diabetes screening. This is done by checking your blood sugar (glucose) after you have not eaten for a while (fasting).  Blood pressure.  Lipid and cholesterol levels. These may be checked every 5 years starting at age 66.  Skin check.  Hepatitis C blood test.  Hepatitis B blood test.  Sexually transmitted disease (STD) testing.  BRCA-related cancer screening. This may be done if you have a family history of breast, ovarian, tubal, or peritoneal cancers.  Pelvic exam and Pap test. This may be done every 3 years starting at age 40. Starting at age 59, this may be done every 5  years if you have a Pap test in combination with an HPV test.  Discuss your test results, treatment options, and if necessary, the need for more tests with your health care provider. Vaccines Your health care provider may recommend certain vaccines, such as:  Influenza vaccine. This is recommended every year.  Tetanus, diphtheria, and acellular pertussis (Tdap, Td) vaccine. You may need a Td booster every 10 years.  Varicella vaccine. You may need this if you have not been vaccinated.  HPV vaccine. If you are 69 or younger, you may need three doses over 6 months.  Measles, mumps, and rubella (MMR) vaccine. You may need at least one dose of MMR. You may also need a second dose.  Pneumococcal 13-valent conjugate (PCV13) vaccine. You may need this if you have certain conditions and were not previously vaccinated.  Pneumococcal polysaccharide (PPSV23) vaccine. You may need one or two doses if you smoke cigarettes or if you have certain conditions.  Meningococcal vaccine. One dose is recommended if you are age 27-21 years and a first-year college student living in a residence hall, or if you have one of several medical conditions. You may also need additional booster doses.  Hepatitis A vaccine. You may need this if you have certain conditions or if you travel or work in places where you may be exposed to hepatitis A.  Hepatitis B vaccine. You may need this if you have certain conditions or if you travel or work in places where you may be exposed to hepatitis B.  Haemophilus influenzae type b (Hib) vaccine. You may need this if  you have certain risk factors.  Talk to your health care provider about which screenings and vaccines you need and how often you need them. This information is not intended to replace advice given to you by your health care provider. Make sure you discuss any questions you have with your health care provider. Document Released: 07/05/2001 Document Revised: 01/27/2016  Document Reviewed: 03/10/2015 Elsevier Interactive Patient Education  2017 [employer].

## 2017-03-22 ENCOUNTER — Encounter: Payer: Self-pay | Admitting: Family Medicine

## 2017-03-22 LAB — COMPREHENSIVE METABOLIC PANEL
A/G RATIO: 2 (ref 1.2–2.2)
ALBUMIN: 4.8 g/dL (ref 3.5–5.5)
ALK PHOS: 72 IU/L (ref 39–117)
ALT: 14 IU/L (ref 0–32)
AST: 13 IU/L (ref 0–40)
BUN / CREAT RATIO: 14 (ref 9–23)
BUN: 10 mg/dL (ref 6–20)
Bilirubin Total: 0.5 mg/dL (ref 0.0–1.2)
CALCIUM: 9.2 mg/dL (ref 8.7–10.2)
CO2: 26 mmol/L (ref 20–29)
CREATININE: 0.7 mg/dL (ref 0.57–1.00)
Chloride: 101 mmol/L (ref 96–106)
GFR calc Af Amer: 129 mL/min/{1.73_m2} (ref >59)
GFR, EST NON AFRICAN AMERICAN: 112 mL/min/{1.73_m2} (ref >59)
Globulin, Total: 2.4 g/dL (ref 1.5–4.5)
Glucose: 82 mg/dL (ref 65–99)
POTASSIUM: 3.9 mmol/L (ref 3.5–5.2)
Sodium: 140 mmol/L (ref 134–144)
Total Protein: 7.2 g/dL (ref 6.0–8.5)

## 2017-03-22 LAB — LIPID PANEL
Chol/HDL Ratio: 2.6 ratio (ref 0.0–4.4)
Cholesterol, Total: 192 mg/dL (ref 100–199)
HDL: 74 mg/dL (ref >39)
LDL Calculated: 100 mg/dL — ABNORMAL HIGH (ref 0–99)
Triglycerides: 91 mg/dL (ref 0–149)
VLDL CHOLESTEROL CAL: 18 mg/dL (ref 5–40)

## 2017-03-22 LAB — CBC
HEMATOCRIT: 40.3 % (ref 34.0–46.6)
Hemoglobin: 13.4 g/dL (ref 11.1–15.9)
MCH: 30.8 pg (ref 26.6–33.0)
MCHC: 33.3 g/dL (ref 31.5–35.7)
MCV: 93 fL (ref 79–97)
PLATELETS: 365 10*3/uL (ref 150–379)
RBC: 4.35 x10E6/uL (ref 3.77–5.28)
RDW: 12.2 % — AB (ref 12.3–15.4)
WBC: 9.7 10*3/uL (ref 3.4–10.8)

## 2017-03-22 LAB — TSH: TSH: 1.17 u[IU]/mL (ref 0.450–4.500)

## 2017-03-22 LAB — VITAMIN D 25 HYDROXY (VIT D DEFICIENCY, FRACTURES): Vit D, 25-Hydroxy: 26.6 ng/mL — ABNORMAL LOW (ref 30.0–100.0)

## 2017-03-24 LAB — CYTOLOGY - PAP
DIAGNOSIS: NEGATIVE
HPV: NOT DETECTED

## 2017-04-06 ENCOUNTER — Encounter: Payer: Self-pay | Admitting: *Deleted

## 2017-04-06 NOTE — Progress Notes (Signed)
Spoke to pt about BRCA result. Explained to pt that there were no clinical significance identified and the 30% lifetime risk is based only on family history. Results suggested pt to have breast exam q28mand MM/MR q171mHer family history of cancer shows earliest cancer diagnosis at age [id number] means she should start recommended testing at ago [id number] earlier than the youngest cancer Dx in family. Pt will review results and if she decides to come in for consult, she will call to schedule.

## 2017-04-12 ENCOUNTER — Other Ambulatory Visit: Payer: BC Managed Care – PPO | Admitting: Family Medicine

## 2017-04-17 ENCOUNTER — Encounter: Payer: Self-pay | Admitting: *Deleted

## 2018-01-18 ENCOUNTER — Encounter: Payer: Self-pay | Admitting: Radiology

## 2018-02-15 ENCOUNTER — Encounter: Payer: Self-pay | Admitting: Radiology

## 2018-04-10 ENCOUNTER — Ambulatory Visit (INDEPENDENT_AMBULATORY_CARE_PROVIDER_SITE_OTHER): Payer: BC Managed Care – PPO | Admitting: Family Medicine

## 2018-04-10 ENCOUNTER — Encounter: Payer: Self-pay | Admitting: Family Medicine

## 2018-04-10 DIAGNOSIS — Z01419 Encounter for gynecological examination (general) (routine) without abnormal findings: Secondary | ICD-10-CM

## 2018-04-10 DIAGNOSIS — Z803 Family history of malignant neoplasm of breast: Secondary | ICD-10-CM

## 2018-04-10 NOTE — Patient Instructions (Signed)
Preventive Care 18-39 Years, Female Preventive care refers to lifestyle choices and visits with your health care provider that can promote health and wellness. What does preventive care include?  A yearly physical exam. This is also called an annual well check.  Dental exams once or twice a year.  Routine eye exams. Ask your health care provider how often you should have your eyes checked.  Personal lifestyle choices, including: ? Daily care of your teeth and gums. ? Regular physical activity. ? Eating a healthy diet. ? Avoiding tobacco and drug use. ? Limiting alcohol use. ? Practicing safe sex. ? Taking vitamin and mineral supplements as recommended by your health care provider. What happens during an annual well check? The services and screenings done by your health care provider during your annual well check will depend on your age, overall health, lifestyle risk factors, and family history of disease. Counseling Your health care provider may ask you questions about your:  Alcohol use.  Tobacco use.  Drug use.  Emotional well-being.  Home and relationship well-being.  Sexual activity.  Eating habits.  Work and work Statistician.  Method of birth control.  Menstrual cycle.  Pregnancy history.  Screening You may have the following tests or measurements:  Height, weight, and BMI.  Diabetes screening. This is done by checking your blood sugar (glucose) after you have not eaten for a while (fasting).  Blood pressure.  Lipid and cholesterol levels. These may be checked every 5 years starting at age 38.  Skin check.  Hepatitis C blood test.  Hepatitis B blood test.  Sexually transmitted disease (STD) testing.  BRCA-related cancer screening. This may be done if you have a family history of breast, ovarian, tubal, or peritoneal cancers.  Pelvic exam and Pap test. This may be done every 3 years starting at age 38. Starting at age 30, this may be done  every 5 years if you have a Pap test in combination with an HPV test.  Discuss your test results, treatment options, and if necessary, the need for more tests with your health care provider. Vaccines Your health care provider may recommend certain vaccines, such as:  Influenza vaccine. This is recommended every year.  Tetanus, diphtheria, and acellular pertussis (Tdap, Td) vaccine. You may need a Td booster every 10 years.  Varicella vaccine. You may need this if you have not been vaccinated.  HPV vaccine. If you are 39 or younger, you may need three doses over 6 months.  Measles, mumps, and rubella (MMR) vaccine. You may need at least one dose of MMR. You may also need a second dose.  Pneumococcal 13-valent conjugate (PCV13) vaccine. You may need this if you have certain conditions and were not previously vaccinated.  Pneumococcal polysaccharide (PPSV23) vaccine. You may need one or two doses if you smoke cigarettes or if you have certain conditions.  Meningococcal vaccine. One dose is recommended if you are age 68-21 years and a first-year college student living in a residence hall, or if you have one of several medical conditions. You may also need additional booster doses.  Hepatitis A vaccine. You may need this if you have certain conditions or if you travel or work in places where you may be exposed to hepatitis A.  Hepatitis B vaccine. You may need this if you have certain conditions or if you travel or work in places where you may be exposed to hepatitis B.  Haemophilus influenzae type b (Hib) vaccine. You may need this  if you have certain risk factors.  Talk to your health care provider about which screenings and vaccines you need and how often you need them. This information is not intended to replace advice given to you by your health care provider. Make sure you discuss any questions you have with your health care provider. Document Released: 07/05/2001 Document Revised:  01/27/2016 Document Reviewed: 03/10/2015 Elsevier Interactive Patient Education  2018 [employer] Inc.  

## 2018-04-10 NOTE — Assessment & Plan Note (Signed)
Lifetime risk is 33% based on FH. Gene diagnosis is negative. Needs q 3 month eval and then 1 mammogram/year and 1 MRI. Earliest age at diagnosis was early 49's. Begin screening at 3.

## 2018-04-10 NOTE — Progress Notes (Signed)
  Subjective:     Danielle Sawyer is a 37 y.o. female and is here for a comprehensive physical exam. The patient reports no problems. Husband has had a vasectomy.  The following portions of the patient's history were reviewed and updated as appropriate: allergies, current medications, past family history, past medical history, past social history, past surgical history and problem list.  Review of Systems Pertinent items noted in HPI and remainder of comprehensive ROS otherwise negative.   Objective:    BP 114/70   Pulse 60   Ht 5\' 6"  (1.676 m)   Wt 148 lb (67.1 kg)   LMP 04/02/2018 (Within Days)   BMI 23.89 kg/m  General appearance: alert, cooperative and appears stated age Neck: no adenopathy, supple, symmetrical, trachea midline and thyroid not enlarged, symmetric, no tenderness/mass/nodules Lungs: clear to auscultation bilaterally Breasts: normal appearance, no masses or tenderness Heart: regular rate and rhythm, S1, S2 normal, no murmur, click, rub or gallop Abdomen: soft, non-tender; bowel sounds normal; no masses,  no organomegaly Extremities: extremities normal, atraumatic, no cyanosis or edema Pulses: 2+ and symmetric Skin: Skin color, texture, turgor normal. No rashes or lesions Lymph nodes: Cervical, supraclavicular, and axillary nodes normal. Neurologic: Grossly normal    Assessment:    Healthy female exam.      Plan:  Pap is up to date, NML with Neg HPV in 10/18 S/p flu shot nml labs last year. Problem List Items Addressed This Visit      Unprioritized   Family history of breast cancer    Lifetime risk is 33% based on FH. Gene diagnosis is negative. Needs q 3 month eval and then 1 mammogram/year and 1 MRI. Earliest age at diagnosis was early 75's. Begin screening at 53.       Other Visit Diagnoses    Encounter for gynecological examination without abnormal finding         Return in 1 year (on 04/11/2019).    See After Visit Summary for Counseling  Recommendations

## 2019-03-08 ENCOUNTER — Encounter: Payer: Self-pay | Admitting: Radiology

## 2019-06-10 ENCOUNTER — Ambulatory Visit (INDEPENDENT_AMBULATORY_CARE_PROVIDER_SITE_OTHER): Payer: BC Managed Care – PPO | Admitting: Family Medicine

## 2019-06-10 ENCOUNTER — Other Ambulatory Visit: Payer: Self-pay

## 2019-06-10 ENCOUNTER — Encounter: Payer: Self-pay | Admitting: Family Medicine

## 2019-06-10 VITALS — BP 130/86 | HR 72 | Wt 147.0 lb

## 2019-06-10 DIAGNOSIS — Z01419 Encounter for gynecological examination (general) (routine) without abnormal findings: Secondary | ICD-10-CM

## 2019-06-10 DIAGNOSIS — Z1151 Encounter for screening for human papillomavirus (HPV): Secondary | ICD-10-CM | POA: Diagnosis not present

## 2019-06-10 DIAGNOSIS — Z124 Encounter for screening for malignant neoplasm of cervix: Secondary | ICD-10-CM

## 2019-06-10 NOTE — Patient Instructions (Signed)
 Preventive Care 21-39 Years Old, Female Preventive care refers to visits with your health care provider and lifestyle choices that can promote health and wellness. This includes:  A yearly physical exam. This may also be called an annual well check.  Regular dental visits and eye exams.  Immunizations.  Screening for certain conditions.  Healthy lifestyle choices, such as eating a healthy diet, getting regular exercise, not using drugs or products that contain nicotine and tobacco, and limiting alcohol use. What can I expect for my preventive care visit? Physical exam Your health care provider will check your:  Height and weight. This may be used to calculate body mass index (BMI), which tells if you are at a healthy weight.  Heart rate and blood pressure.  Skin for abnormal spots. Counseling Your health care provider may ask you questions about your:  Alcohol, tobacco, and drug use.  Emotional well-being.  Home and relationship well-being.  Sexual activity.  Eating habits.  Work and work environment.  Method of birth control.  Menstrual cycle.  Pregnancy history. What immunizations do I need?  Influenza (flu) vaccine  This is recommended every year. Tetanus, diphtheria, and pertussis (Tdap) vaccine  You may need a Td booster every 10 years. Varicella (chickenpox) vaccine  You may need this if you have not been vaccinated. Human papillomavirus (HPV) vaccine  If recommended by your health care provider, you may need three doses over 6 months. Measles, mumps, and rubella (MMR) vaccine  You may need at least one dose of MMR. You may also need a second dose. Meningococcal conjugate (MenACWY) vaccine  One dose is recommended if you are age 19-21 years and a first-year college student living in a residence hall, or if you have one of several medical conditions. You may also need additional booster doses. Pneumococcal conjugate (PCV13) vaccine  You may need  this if you have certain conditions and were not previously vaccinated. Pneumococcal polysaccharide (PPSV23) vaccine  You may need one or two doses if you smoke cigarettes or if you have certain conditions. Hepatitis A vaccine  You may need this if you have certain conditions or if you travel or work in places where you may be exposed to hepatitis A. Hepatitis B vaccine  You may need this if you have certain conditions or if you travel or work in places where you may be exposed to hepatitis B. Haemophilus influenzae type b (Hib) vaccine  You may need this if you have certain conditions. You may receive vaccines as individual doses or as more than one vaccine together in one shot (combination vaccines). Talk with your health care provider about the risks and benefits of combination vaccines. What tests do I need?  Blood tests  Lipid and cholesterol levels. These may be checked every 5 years starting at age 20.  Hepatitis C test.  Hepatitis B test. Screening  Diabetes screening. This is done by checking your blood sugar (glucose) after you have not eaten for a while (fasting).  Sexually transmitted disease (STD) testing.  BRCA-related cancer screening. This may be done if you have a family history of breast, ovarian, tubal, or peritoneal cancers.  Pelvic exam and Pap test. This may be done every 3 years starting at age 21. Starting at age 30, this may be done every 5 years if you have a Pap test in combination with an HPV test. Talk with your health care provider about your test results, treatment options, and if necessary, the need for more   tests. Follow these instructions at home: Eating and drinking   Eat a diet that includes fresh fruits and vegetables, whole grains, lean protein, and low-fat dairy.  Take vitamin and mineral supplements as recommended by your health care provider.  Do not drink alcohol if: ? Your health care provider tells you not to drink. ? You are  pregnant, may be pregnant, or are planning to become pregnant.  If you drink alcohol: ? Limit how much you have to 0-1 drink a day. ? Be aware of how much alcohol is in your drink. In the [place] one drink equals one 12 oz bottle of beer (355 mL), one 5 oz glass of wine (148 mL), or one 1 oz glass of hard liquor (44 mL). Lifestyle  Take daily care of your teeth and gums.  Stay active. Exercise for at least 30 minutes on 5 or more days each week.  Do not use any products that contain nicotine or tobacco, such as cigarettes, e-cigarettes, and chewing tobacco. If you need help quitting, ask your health care provider.  If you are sexually active, practice safe sex. Use a condom or other form of birth control (contraception) in order to prevent pregnancy and STIs (sexually transmitted infections). If you plan to become pregnant, see your health care provider for a preconception visit. What's next?  Visit your health care provider once a year for a well check visit.  Ask your health care provider how often you should have your eyes and teeth checked.  Stay up to date on all vaccines. This information is not intended to replace advice given to you by your health care provider. Make sure you discuss any questions you have with your health care provider. Document Revised: 01/18/2018 Document Reviewed: 01/18/2018 Elsevier Patient Education  2020 Elsevier Inc.  

## 2019-06-10 NOTE — Progress Notes (Signed)
LAST PAP 2018-NORMAL

## 2019-06-10 NOTE — Progress Notes (Signed)
  Subjective:     Danielle Sawyer is a 39 y.o. female and is here for a comprehensive physical exam. The patient reports no problems.     The following portions of the patient's history were reviewed and updated as appropriate: allergies, current medications, past family history, past medical history, past social history, past surgical history and problem list.  Review of Systems Pertinent items noted in HPI and remainder of comprehensive ROS otherwise negative.   Objective:    BP 130/86   Pulse 72   Wt 147 lb (66.7 kg)   LMP 05/23/2019   BMI 23.73 kg/m  General appearance: alert, cooperative and appears stated age Head: Normocephalic, without obvious abnormality, atraumatic Neck: no adenopathy, supple, symmetrical, trachea midline and thyroid not enlarged, symmetric, no tenderness/mass/nodules Lungs: clear to auscultation bilaterally Breasts: normal appearance, no masses or tenderness Heart: regular rate and rhythm, S1, S2 normal, no murmur, click, rub or gallop Abdomen: soft, non-tender; bowel sounds normal; no masses,  no organomegaly Pelvic: cervix normal in appearance, external genitalia normal, no adnexal masses or tenderness, no cervical motion tenderness, uterus normal size, shape, and consistency and vagina normal without discharge Extremities: extremities normal, atraumatic, no cyanosis or edema Pulses: 2+ and symmetric Skin: Skin color, texture, turgor normal. No rashes or lesions Lymph nodes: Cervical, supraclavicular, and axillary nodes normal. Neurologic: Grossly normal    Assessment:    Healthy female exam.      Plan:   Problem List Items Addressed This Visit    None    Visit Diagnoses    Encounter for gynecological examination without abnormal finding    -  Primary   Screening for cervical cancer       Relevant Orders   Cytology - PAP( [facility])     Return in 1 year (on 06/09/2020).    See After Visit Summary for Counseling Recommendations

## 2019-06-12 LAB — CYTOLOGY - PAP
Comment: NEGATIVE
Diagnosis: NEGATIVE
High risk HPV: NEGATIVE

## 2019-09-10 ENCOUNTER — Ambulatory Visit: Payer: BC Managed Care – PPO | Admitting: Dermatology

## 2019-09-10 ENCOUNTER — Other Ambulatory Visit: Payer: Self-pay

## 2019-09-10 DIAGNOSIS — L910 Hypertrophic scar: Secondary | ICD-10-CM | POA: Diagnosis not present

## 2019-09-10 DIAGNOSIS — D2372 Other benign neoplasm of skin of left lower limb, including hip: Secondary | ICD-10-CM

## 2019-09-10 DIAGNOSIS — D239 Other benign neoplasm of skin, unspecified: Secondary | ICD-10-CM

## 2019-09-10 DIAGNOSIS — D2361 Other benign neoplasm of skin of right upper limb, including shoulder: Secondary | ICD-10-CM

## 2019-09-10 MED ORDER — MUPIROCIN 2 % EX OINT: 1.0000 "application " | TOPICAL_OINTMENT | Freq: Every day | CUTANEOUS | 0 refills | Status: DC

## 2019-09-10 NOTE — Patient Instructions (Signed)

## 2019-09-10 NOTE — Progress Notes (Signed)
Follow-Up Visit   Subjective  Danielle Sawyer is a 39 y.o. female who presents for the following: Severe Dysplastic Nevus, bx proven (L mid pretibial, and R post shoulder/post deltoid bx 07/10/19) and keloid (R back).  The patient presents for mole check.  The following portions of the chart were reviewed this encounter and updated as appropriate:  Tobacco  Allergies  Meds  Problems  Med Hx  Surg Hx  Fam Hx      Review of Systems:  No other skin or systemic complaints except as noted in HPI or Assessment and Plan.  Objective  Well appearing patient in no apparent distress; mood and affect are within normal limits.  A focused examination was performed including L pretibial, R shoulder. Relevant physical exam findings are noted in the Assessment and Plan.  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  Skin excision - L mid pretibial  Lesion length (cm):  1 Lesion width (cm):  0.7 Margin per side (cm):  0.2 Total excision diameter (cm):  1.4 Informed consent: discussed and consent obtained   Timeout: patient name, date of birth, surgical site, and procedure verified   Procedure prep:  Patient was prepped and draped in usual sterile fashion Prep type:  Isopropyl alcohol and povidone-iodine Anesthesia: the lesion was anesthetized in a standard fashion   Anesthetic:  1% lidocaine w/ epinephrine 1-100,000 buffered w/ 8.4% NaHCO3 (10cc) Instrument used: #15 blade   Hemostasis achieved with: pressure   Hemostasis achieved with comment:  Electrocautery Outcome: patient tolerated procedure well with no complications   Post-procedure details: sterile dressing applied and wound  care instructions given   Dressing type: bandage and pressure dressing (Mupirocin)    Skin repair - L mid pretibial Complexity:  Complex Final length (cm):  4 Reason for type of repair: reduce tension to allow closure, reduce the risk of dehiscence, infection, and necrosis, reduce subcutaneous dead space and avoid a hematoma, allow closure of the large defect, preserve normal anatomy, preserve normal anatomical and functional relationships and enhance both functionality and cosmetic results   Undermining: area extensively undermined   Undermining comment:  Undermining Defect - 1.2 cm Subcutaneous layers (deep stitches):  Suture size:  3-0 Suture type: Vicryl (polyglactin 910)   Subcutaneous suture technique: Inverted Dermal. Fine/surface layer approximation (top stitches):  Suture size:  3-0 Suture type comment:  Nylon Stitches: horizontal mattress   Stitches comment:  Nylon Suture removal (days):  7 Hemostasis achieved with: suture and pressure Outcome: patient tolerated procedure well with no complications   Post-procedure details: sterile dressing applied and wound care instructions given   Dressing type: bandage and pressure dressing (Mupirocin)    mupirocin ointment (BACTROBAN) 2 % - L mid pretibial  Specimen 1 - Surgical pathology Differential Diagnosis: D48.5 Dysplastic nevus Check Margins: No Pink bx site 1.0 x 0.7cm Previous bx [id number]  Keloid R shoulder  Discussed [place] kenalog injections  Return in about 1 week (around 09/17/2019) for sr and need to schedule pt for exc of severe dysplastic nevus of the R post shoulder/post deltoid.    Documentation: I have reviewed the above documentation for accuracy and completeness, and I agree with the above.  Sarina Ser,  MD   I, Othelia Pulling, RMA, am acting as scribe for Sarina Ser, MD .

## 2019-09-11 ENCOUNTER — Encounter: Payer: Self-pay | Admitting: Dermatology

## 2019-09-11 ENCOUNTER — Telehealth: Payer: Self-pay

## 2019-09-11 NOTE — Telephone Encounter (Signed)
Pt doing well after yesterday's surgery./sh 

## 2019-09-17 ENCOUNTER — Ambulatory Visit (INDEPENDENT_AMBULATORY_CARE_PROVIDER_SITE_OTHER): Payer: BC Managed Care – PPO | Admitting: Dermatology

## 2019-09-17 ENCOUNTER — Other Ambulatory Visit: Payer: Self-pay

## 2019-09-17 DIAGNOSIS — Z86018 Personal history of other benign neoplasm: Secondary | ICD-10-CM

## 2019-09-17 DIAGNOSIS — Z4802 Encounter for removal of sutures: Secondary | ICD-10-CM

## 2019-09-18 NOTE — Progress Notes (Signed)
   Follow-Up Visit   Subjective  Danielle Sawyer is a 39 y.o. female who presents for the following: post op./suture removal (patient is here today for suture removal - excision site doing well no reported problems from patient).  The following portions of the chart were reviewed this encounter and updated as appropriate:     Review of Systems:  No other skin or systemic complaints except as noted in HPI or Assessment and Plan.  Objective  Well appearing patient in no apparent distress; mood and affect are within normal limits.  A focused examination was performed including L lower leg. Relevant physical exam findings are noted in the Assessment and Plan.  Objective  L mid pretibial: Healing excision site  Assessment & Plan  History of dysplastic nevus L mid pretibial Path  Shows margins free  Encounter for Removal of Sutures - Incision site at the L mid pretibial is clean, dry and intact - Wound cleansed, sutures removed, wound cleansed and steri strips applied.  - Discussed pathology results showing margins free severely dysplastic nevus - Patient advised to keep steri-strips dry until they fall off. - Scars remodel for a full year. - Once steri-strips fall off, patient can apply over-the-counter silicone scar cream each night to help with scar remodeling if desired. - Patient advised to call with any concerns or if they notice any new or changing lesions.   Return for appointment as scheduled.  Luther Redo, CMA, am acting as scribe for Sarina Ser, MD .  Documentation: I have reviewed the above documentation for accuracy and completeness, and I agree with the above.  Sarina Ser, MD

## 2019-10-29 ENCOUNTER — Encounter: Payer: BC Managed Care – PPO | Admitting: Dermatology

## 2019-12-03 ENCOUNTER — Encounter: Payer: BC Managed Care – PPO | Admitting: Dermatology

## 2020-01-08 ENCOUNTER — Ambulatory Visit: Payer: BC Managed Care – PPO | Admitting: Dermatology

## 2020-01-08 ENCOUNTER — Other Ambulatory Visit: Payer: Self-pay

## 2020-01-08 ENCOUNTER — Encounter: Payer: Self-pay | Admitting: Dermatology

## 2020-01-08 DIAGNOSIS — L578 Other skin changes due to chronic exposure to nonionizing radiation: Secondary | ICD-10-CM

## 2020-01-08 DIAGNOSIS — D1800 Hemangioma unspecified site: Secondary | ICD-10-CM | POA: Diagnosis not present

## 2020-01-08 DIAGNOSIS — Z1283 Encounter for screening for malignant neoplasm of skin: Secondary | ICD-10-CM | POA: Diagnosis not present

## 2020-01-08 DIAGNOSIS — D485 Neoplasm of uncertain behavior of skin: Secondary | ICD-10-CM

## 2020-01-08 DIAGNOSIS — L821 Other seborrheic keratosis: Secondary | ICD-10-CM

## 2020-01-08 DIAGNOSIS — D229 Melanocytic nevi, unspecified: Secondary | ICD-10-CM | POA: Diagnosis not present

## 2020-01-08 DIAGNOSIS — Z86018 Personal history of other benign neoplasm: Secondary | ICD-10-CM

## 2020-01-08 DIAGNOSIS — L814 Other melanin hyperpigmentation: Secondary | ICD-10-CM

## 2020-01-08 NOTE — Progress Notes (Signed)
   Follow-Up Visit   Subjective  Danielle Sawyer is a 39 y.o. female who presents for the following: Annual Exam (Hx dysplastic nevi ). No new or changing moles, lesions, or spots that the patient has noticed.  The patient presents for Total-Body Skin Exam (TBSE) for skin cancer screening and mole check.  The following portions of the chart were reviewed this encounter and updated as appropriate:  Tobacco  Allergies  Meds  Problems  Med Hx  Surg Hx  Fam Hx     Review of Systems:  No other skin or systemic complaints except as noted in HPI or Assessment and Plan.  Objective  Well appearing patient in no apparent distress; mood and affect are within normal limits.  A full examination was performed including scalp, head, eyes, ears, nose, lips, neck, chest, axillae, abdomen, back, buttocks, bilateral upper extremities, bilateral lower extremities, hands, feet, fingers, toes, fingernails, and toenails. All findings within normal limits unless otherwise noted below.  Objective  numerous - see history: Scar with no evidence of recurrence.   Objective  R sup scapula: 0.5 cm irregular brown macule   Assessment & Plan  History of dysplastic nevus numerous - see history  Clear. Observe for recurrence. Call clinic for new or changing lesions.  Recommend regular skin exams, daily broad-spectrum spf 30+ sunscreen use, and photoprotection.     Neoplasm of uncertain behavior of skin R sup scapula  Skin / nail biopsy Type of biopsy: tangential   Informed consent: discussed and consent obtained   Timeout: patient name, date of birth, surgical site, and procedure verified   Procedure prep:  Patient was prepped and draped in usual sterile fashion Prep type:  Isopropyl alcohol Anesthesia: the lesion was anesthetized in a standard fashion   Anesthetic:  1% lidocaine w/ epinephrine 1-100,000 buffered w/ 8.4% NaHCO3 Instrument used: flexible razor blade   Hemostasis achieved with:  pressure, aluminum chloride and electrodesiccation   Outcome: patient tolerated procedure well   Post-procedure details: sterile dressing applied and wound care instructions given   Dressing type: bandage and petrolatum    Specimen 1 - Surgical pathology Differential Diagnosis: D48.5 r/o dysplastic nevus  Check Margins: No 0.5 cm irregular brown macule   Lentigines - Scattered tan macules - Discussed due to sun exposure - Benign, observe - Call for any changes  Seborrheic Keratoses - Stuck-on, waxy, tan-brown papules and plaques  - Discussed benign etiology and prognosis. - Observe - Call for any changes  Melanocytic Nevi - Tan-brown and/or pink-flesh-colored symmetric macules and papules - Benign appearing on exam today - Observation - Call clinic for new or changing moles - Recommend daily use of broad spectrum spf 30+ sunscreen to sun-exposed areas.   Hemangiomas - Red papules - Discussed benign nature - Observe - Call for any changes  Actinic Damage - diffuse scaly erythematous macules with underlying dyspigmentation - Recommend daily broad spectrum sunscreen SPF 30+ to sun-exposed areas, reapply every 2 hours as needed.  - Call for new or changing lesions.  Skin cancer screening performed today.  Return for appointment as scheduled, surgery.  Luther Redo, CMA, am acting as scribe for Sarina Ser, MD .  Documentation: I have reviewed the above documentation for accuracy and completeness, and I agree with the above.  Sarina Ser, MD

## 2020-01-08 NOTE — Patient Instructions (Signed)

## 2020-01-14 ENCOUNTER — Encounter: Payer: Self-pay | Admitting: Dermatology

## 2020-01-15 ENCOUNTER — Telehealth: Payer: Self-pay

## 2020-01-15 NOTE — Telephone Encounter (Signed)
Left message on voice mail for patient to return my call.

## 2020-01-15 NOTE — Telephone Encounter (Signed)
-----   Message from Ralene Bathe, MD sent at 01/14/2020 10:33 AM EDT ----- Skin , right sup scapula DYSPLASTIC COMPOUND NEVUS WITH SEVERE ATYPIA,  Severe dysplastic Schedule surgery

## 2020-01-20 ENCOUNTER — Telehealth: Payer: Self-pay

## 2020-01-20 NOTE — Telephone Encounter (Signed)
-----   Message from Ralene Bathe, MD sent at 01/14/2020 10:33 AM EDT ----- Skin , right sup scapula DYSPLASTIC COMPOUND NEVUS WITH SEVERE ATYPIA,  Severe dysplastic Schedule surgery

## 2020-01-20 NOTE — Telephone Encounter (Signed)
Left message on voicemail to return my call.  

## 2020-01-21 ENCOUNTER — Other Ambulatory Visit: Payer: Self-pay

## 2020-01-21 ENCOUNTER — Ambulatory Visit: Payer: BC Managed Care – PPO | Admitting: Dermatology

## 2020-01-21 DIAGNOSIS — D2361 Other benign neoplasm of skin of right upper limb, including shoulder: Secondary | ICD-10-CM

## 2020-01-21 DIAGNOSIS — D239 Other benign neoplasm of skin, unspecified: Secondary | ICD-10-CM

## 2020-01-21 NOTE — Patient Instructions (Signed)

## 2020-01-21 NOTE — Progress Notes (Addendum)
   Follow-Up Visit   Subjective  Danielle Sawyer is a 39 y.o. female who presents for the following: Procedure (Biopsy proven severe dysplastic nevus of right post shouler/post deltoid - [facility] today).  The following portions of the chart were reviewed this encounter and updated as appropriate:  Tobacco  Allergies  Meds  Problems  Med Hx  Surg Hx  Fam Hx     Review of Systems:  No other skin or systemic complaints except as noted in HPI or Assessment and Plan.  Objective  Well appearing patient in no apparent distress; mood and affect are within normal limits.  A focused examination was performed including back, right arm. Relevant physical exam findings are noted in the Assessment and Plan.  Objective  Right post shoulder/post deltoid, Right sup scapula: Healing biopsy sites.  Photo of right sup scapula taken today.  Images     Assessment & Plan  Dysplastic nevus (2) Right post shoulder/post deltoid; Right sup scapula  Photo of right sup scapula taken today. Will plan excision in the future.  Skin excision - Right post shoulder/post deltoid  Lesion length (cm):  0.7 Lesion width (cm):  0.5 Margin per side (cm):  0.2 Total excision diameter (cm):  1.1 Informed consent: discussed and consent obtained   Timeout: patient name, date of birth, surgical site, and procedure verified   Procedure prep:  Patient was prepped and draped in usual sterile fashion Prep type:  Isopropyl alcohol and povidone-iodine Anesthesia: the lesion was anesthetized in a standard fashion   Anesthetic:  1% lidocaine w/ epinephrine 1-100,000 buffered w/ 8.4% NaHCO3 Instrument used: #15 blade   Hemostasis achieved with: pressure   Hemostasis achieved with comment:  Electrocautery Outcome: patient tolerated procedure well with no complications   Post-procedure details: sterile dressing applied and wound care instructions given   Dressing type: bandage and pressure dressing (mupirocin)     Skin repair - Right post shoulder/post deltoid Complexity:  Complex Final length (cm):  2.7 Reason for type of repair: reduce tension to allow closure, reduce the risk of dehiscence, infection, and necrosis, reduce subcutaneous dead space and avoid a hematoma, allow closure of the large defect, preserve normal anatomy, preserve normal anatomical and functional relationships and enhance both functionality and cosmetic results   Undermining: area extensively undermined   Undermining comment:  Undermining defect 1.0 cm Subcutaneous layers (deep stitches):  Suture size:  2-0 Suture type: Vicryl (polyglactin 910)   Subcutaneous suture technique: inverted dermal. Fine/surface layer approximation (top stitches):  Suture type: nylon   Stitches: simple running   Suture removal (days):  7 Hemostasis achieved with: suture and pressure Outcome: patient tolerated procedure well with no complications   Post-procedure details: sterile dressing applied and wound care instructions given   Dressing type: bandage and pressure dressing (mupirocin)    Specimen 1 - Surgical pathology Differential Diagnosis: Severe dysplastic nevus  Check Margins:Yes Healing biopsy site [phone]  Return in about 1 week (around 01/28/2020) for suture removal.  I, Ashok Cordia, CMA, am acting as scribe for Sarina Ser, MD .  Documentation: I have reviewed the above documentation for accuracy and completeness, and I agree with the above.  Sarina Ser, MD

## 2020-01-25 ENCOUNTER — Encounter: Payer: Self-pay | Admitting: Dermatology

## 2020-01-28 ENCOUNTER — Other Ambulatory Visit: Payer: Self-pay

## 2020-01-28 ENCOUNTER — Ambulatory Visit: Payer: BC Managed Care – PPO

## 2020-01-28 DIAGNOSIS — D239 Other benign neoplasm of skin, unspecified: Secondary | ICD-10-CM

## 2020-01-28 NOTE — Progress Notes (Signed)
° °  Follow-Up Visit   Subjective  Danielle Sawyer is a 39 y.o. female who presents for the following: Follow-up (Patient here today for suture removal. ).    The following portions of the chart were reviewed this encounter and updated as appropriate:      Review of Systems:  No other skin or systemic complaints except as noted in HPI or Assessment and Plan.  Objective  Well appearing patient in no apparent distress; mood and affect are within normal limits.  A focused examination was performed including upper back/shoulder. Relevant physical exam findings are noted in the Assessment and Plan.    Assessment & Plan    Encounter for Removal of Sutures - Incision site at the right posterior shoulder/posterior deltoid is clean, dry and intact - Wound cleansed, sutures removed, wound cleansed and steri strips applied.  - Pathology results not in yet.  - Patient advised to keep steri-strips dry until they fall off. - Scars remodel for a full year. - Once steri-strips fall off, patient can apply over-the-counter silicone scar cream each night to help with scar remodeling if desired. - Patient advised to call with any concerns or if they notice any new or changing lesions.   Return for Surgery for dysplastic nevi with severe atypia.

## 2020-01-29 ENCOUNTER — Telehealth: Payer: Self-pay

## 2020-01-29 NOTE — Telephone Encounter (Signed)
-----   Message from Ralene Bathe, MD sent at 01/28/2020 10:56 PM EDT ----- Skin (M), right post shoulder/post deltoid EXCISION, PERSISTENT DYSPLASTIC NEVUS, MARGINS FREE  Severe dysplastic Margins free

## 2020-01-29 NOTE — Telephone Encounter (Signed)
Advised patient of results/hd  

## 2020-03-24 ENCOUNTER — Encounter: Payer: BC Managed Care – PPO | Admitting: Dermatology

## 2020-04-01 ENCOUNTER — Encounter: Payer: Self-pay | Admitting: Radiology

## 2020-05-05 ENCOUNTER — Encounter: Payer: Self-pay | Admitting: Dermatology

## 2020-05-05 ENCOUNTER — Other Ambulatory Visit: Payer: Self-pay

## 2020-05-05 ENCOUNTER — Ambulatory Visit: Payer: BC Managed Care – PPO | Admitting: Dermatology

## 2020-05-05 DIAGNOSIS — D485 Neoplasm of uncertain behavior of skin: Secondary | ICD-10-CM

## 2020-05-05 DIAGNOSIS — D492 Neoplasm of unspecified behavior of bone, soft tissue, and skin: Secondary | ICD-10-CM

## 2020-05-05 MED ORDER — MUPIROCIN 2 % EX OINT: 1.0000 "application " | TOPICAL_OINTMENT | Freq: Every day | CUTANEOUS | 0 refills | Status: DC

## 2020-05-05 NOTE — Progress Notes (Signed)
   Follow-Up Visit   Subjective  Danielle Sawyer is a 39 y.o. female who presents for the following: severe dysplastic nevus bx proven (R sup scapula, pt presents for excision).  The following portions of the chart were reviewed this encounter and updated as appropriate:   Tobacco  Allergies  Meds  Problems  Med Hx  Surg Hx  Fam Hx     Review of Systems:  No other skin or systemic complaints except as noted in HPI or Assessment and Plan.  Objective  Well appearing patient in no apparent distress; mood and affect are within normal limits.  A focused examination was performed including back. Relevant physical exam findings are noted in the Assessment and Plan.  Objective  Right superior scapula: Pink bx site 0.7 x 0.5cm   Assessment & Plan  Neoplasm of skin Right superior scapula  Skin excision  Lesion length (cm):  0.7 Lesion width (cm):  0.5 Margin per side (cm):  0.2 Total excision diameter (cm):  1.1 Informed consent: discussed and consent obtained   Timeout: patient name, date of birth, surgical site, and procedure verified   Procedure prep:  Patient was prepped and draped in usual sterile fashion Prep type:  Isopropyl alcohol and povidone-iodine Anesthesia: the lesion was anesthetized in a standard fashion   Anesthetic:  1% lidocaine w/ epinephrine 1-100,000 buffered w/ 8.4% NaHCO3 (8cc) Instrument used comment:  15c blade Hemostasis achieved with: pressure   Hemostasis achieved with comment:  Electrocautery Outcome: patient tolerated procedure well with no complications   Post-procedure details: sterile dressing applied and wound care instructions given   Dressing type: bandage and pressure dressing (Mupirocin)    Skin repair Complexity:  Complex Final length (cm):  3 Reason for type of repair: reduce tension to allow closure, reduce the risk of dehiscence, infection, and necrosis, reduce subcutaneous dead space and avoid a hematoma, allow closure of the  large defect, preserve normal anatomy, preserve normal anatomical and functional relationships and enhance both functionality and cosmetic results   Undermining: area extensively undermined   Undermining comment:  Undermining Defect 1.1cm Subcutaneous layers (deep stitches):  Suture size:  3-0 Suture type: Vicryl (polyglactin 910)   Subcutaneous suture technique: Inverted Dermal. Fine/surface layer approximation (top stitches):  Suture size:  3-0 Suture type: nylon   Stitches: simple running   Suture removal (days):  7 Hemostasis achieved with: pressure Outcome: patient tolerated procedure well with no complications   Post-procedure details: sterile dressing applied and wound care instructions given   Dressing type: bandage, pressure dressing and bacitracin (Mupirocin)    Specimen 1 - Surgical pathology Differential Diagnosis: D48.5 Bx proven severe dysplastic nevus Check Margins: yes Pink bx site 0.7 x 0.5cm [id number]  Severe Dysplastic Nevus bx proven excised today  Ordered Medications: mupirocin ointment (BACTROBAN) 2 %  Return in about 1 week (around 05/12/2020) for suture removal.   I, Othelia Pulling, RMA, am acting as scribe for Sarina Ser, MD .  Documentation: I have reviewed the above documentation for accuracy and completeness, and I agree with the above.  Sarina Ser, MD

## 2020-05-05 NOTE — Patient Instructions (Signed)

## 2020-05-06 ENCOUNTER — Telehealth: Payer: Self-pay

## 2020-05-06 NOTE — Telephone Encounter (Signed)
Pt doing fine after yesterdays surgery./sh 

## 2020-05-10 ENCOUNTER — Encounter: Payer: Self-pay | Admitting: Dermatology

## 2020-05-12 ENCOUNTER — Other Ambulatory Visit: Payer: Self-pay

## 2020-05-12 ENCOUNTER — Ambulatory Visit (INDEPENDENT_AMBULATORY_CARE_PROVIDER_SITE_OTHER): Payer: BC Managed Care – PPO | Admitting: Dermatology

## 2020-05-12 ENCOUNTER — Encounter: Payer: Self-pay | Admitting: Dermatology

## 2020-05-12 DIAGNOSIS — Z4802 Encounter for removal of sutures: Secondary | ICD-10-CM

## 2020-05-12 DIAGNOSIS — Z86018 Personal history of other benign neoplasm: Secondary | ICD-10-CM

## 2020-05-12 NOTE — Progress Notes (Signed)
   Follow-Up Visit   Subjective  Danielle Sawyer is a 39 y.o. female who presents for the following: dysplastic nevus (S/P excision - margins free severely dysplastic nevus of the R sup scapula, patient is here today for suture removal).  The following portions of the chart were reviewed this encounter and updated as appropriate:   Tobacco  Allergies  Meds  Problems  Med Hx  Surg Hx  Fam Hx     Review of Systems:  No other skin or systemic complaints except as noted in HPI or Assessment and Plan.  Objective  Well appearing patient in no apparent distress; mood and affect are within normal limits.  A focused examination was performed including the trunk. Relevant physical exam findings are noted in the Assessment and Plan.  Objective  R sup scapula: Healing excision site   Assessment & Plan  History of dysplastic nevus R sup scapula  Encounter for Removal of Sutures - Incision site at the R sup scapula is clean, dry and intact - Wound cleansed, sutures removed, wound cleansed and steri strips applied.  - Discussed pathology results showing margins free dysplastic nevus - Patient advised to keep steri-strips dry until they fall off. - Scars remodel for a full year. - Once steri-strips fall off, patient can apply over-the-counter silicone scar cream each night to help with scar remodeling if desired. - Patient advised to call with any concerns or if they notice any new or changing lesions.   Return for appointment as scheduled.  Luther Redo, CMA, am acting as scribe for Sarina Ser, MD .  Documentation: I have reviewed the above documentation for accuracy and completeness, and I agree with the above.  Sarina Ser, MD

## 2020-05-20 ENCOUNTER — Encounter: Payer: Self-pay | Admitting: Dermatology

## 2020-06-29 ENCOUNTER — Ambulatory Visit: Payer: BC Managed Care – PPO | Admitting: Dermatology

## 2020-06-29 ENCOUNTER — Encounter: Payer: Self-pay | Admitting: Dermatology

## 2020-06-29 ENCOUNTER — Other Ambulatory Visit: Payer: Self-pay

## 2020-06-29 DIAGNOSIS — L821 Other seborrheic keratosis: Secondary | ICD-10-CM

## 2020-06-29 DIAGNOSIS — L578 Other skin changes due to chronic exposure to nonionizing radiation: Secondary | ICD-10-CM | POA: Diagnosis not present

## 2020-06-29 DIAGNOSIS — Z1283 Encounter for screening for malignant neoplasm of skin: Secondary | ICD-10-CM

## 2020-06-29 DIAGNOSIS — L814 Other melanin hyperpigmentation: Secondary | ICD-10-CM

## 2020-06-29 DIAGNOSIS — Z86018 Personal history of other benign neoplasm: Secondary | ICD-10-CM

## 2020-06-29 DIAGNOSIS — L719 Rosacea, unspecified: Secondary | ICD-10-CM | POA: Diagnosis not present

## 2020-06-29 DIAGNOSIS — D1800 Hemangioma unspecified site: Secondary | ICD-10-CM

## 2020-06-29 DIAGNOSIS — D229 Melanocytic nevi, unspecified: Secondary | ICD-10-CM

## 2020-06-29 NOTE — Progress Notes (Unsigned)
   Follow-Up Visit   Subjective  Danielle Sawyer is a 40 y.o. female who presents for the following: Annual Exam (Hx dysplastic nevi ). The patient presents for Total-Body Skin Exam (TBSE) for skin cancer screening and mole check.  The following portions of the chart were reviewed this encounter and updated as appropriate:   Tobacco  Allergies  Meds  Problems  Med Hx  Surg Hx  Fam Hx     Review of Systems:  No other skin or systemic complaints except as noted in HPI or Assessment and Plan.  Objective  Well appearing patient in no apparent distress; mood and affect are within normal limits.  A full examination was performed including scalp, head, eyes, ears, nose, lips, neck, chest, axillae, abdomen, back, buttocks, bilateral upper extremities, bilateral lower extremities, hands, feet, fingers, toes, fingernails, and toenails. All findings within normal limits unless otherwise noted below.  Objective  Face: Erythema   Assessment & Plan  Rosacea Face Exacerbated by mask -  Discussed Rx treatment. patient prefers to wait until we are no longer wearing masks to discuss tx options  Rosacea is a chronic progressive skin condition usually affecting the face of adults, causing redness and/or acne bumps. It is treatable but not curable. It sometimes affects the eyes (ocular rosacea) as well. It may respond to topical and/or systemic medication and can flare with stress, sun exposure, alcohol, exercise and some foods.  Daily application of broad spectrum spf 30+ sunscreen to face is recommended to reduce flares.  Skin cancer screening   Lentigines - Scattered tan macules - Discussed due to sun exposure - Benign, observe - Call for any changes  Seborrheic Keratoses - Stuck-on, waxy, tan-brown papules and plaques  - Discussed benign etiology and prognosis. - Observe - Call for any changes  Melanocytic Nevi - Tan-brown and/or pink-flesh-colored symmetric macules and papules -  Benign appearing on exam today - Observation - Call clinic for new or changing moles - Recommend daily use of broad spectrum spf 30+ sunscreen to sun-exposed areas.   Hemangiomas - Red papules - Discussed benign nature - Observe - Call for any changes  Actinic Damage - Chronic, secondary to cumulative UV/sun exposure - diffuse scaly erythematous macules with underlying dyspigmentation - Recommend daily broad spectrum sunscreen SPF 30+ to sun-exposed areas, reapply every 2 hours as needed.  - Call for new or changing lesions.  History of Dysplastic Nevi - No evidence of recurrence today - Recommend regular full body skin exams - Recommend daily broad spectrum sunscreen SPF 30+ to sun-exposed areas, reapply every 2 hours as needed.  - Call if any new or changing lesions are noted between office visits  Skin cancer screening performed today.  Return in about 6 months (around 12/27/2020) for TBSE - hx severely dysplastic nevi.  Luther Redo, CMA, am acting as scribe for Sarina Ser, MD .  Documentation: I have reviewed the above documentation for accuracy and completeness, and I agree with the above.  Sarina Ser, MD

## 2020-07-02 ENCOUNTER — Encounter: Payer: Self-pay | Admitting: Dermatology

## 2020-08-13 ENCOUNTER — Encounter: Payer: Self-pay | Admitting: Family Medicine

## 2020-08-13 ENCOUNTER — Ambulatory Visit (INDEPENDENT_AMBULATORY_CARE_PROVIDER_SITE_OTHER): Payer: BC Managed Care – PPO | Admitting: Family Medicine

## 2020-08-13 ENCOUNTER — Other Ambulatory Visit: Payer: Self-pay

## 2020-08-13 VITALS — BP 121/77 | HR 66 | Ht 66.0 in | Wt 145.8 lb

## 2020-08-13 DIAGNOSIS — Z01419 Encounter for gynecological examination (general) (routine) without abnormal findings: Secondary | ICD-10-CM | POA: Diagnosis not present

## 2020-08-13 NOTE — Progress Notes (Signed)
  Subjective:     Danielle Sawyer is a 40 y.o. female and is here for a comprehensive physical exam. The patient reports no problems.     The following portions of the patient's history were reviewed and updated as appropriate: allergies, current medications, past family history, past medical history, past social history, past surgical history and problem list.  Review of Systems Pertinent items noted in HPI and remainder of comprehensive ROS otherwise negative.   Objective:    BP 121/77   Pulse 66   Ht 5\' 6"  (1.676 m)   Wt 145 lb 12.8 oz (66.1 kg)   BMI 23.53 kg/m  General appearance: alert, cooperative and appears stated age Head: Normocephalic, without obvious abnormality, atraumatic Neck: no adenopathy, supple, symmetrical, trachea midline and thyroid not enlarged, symmetric, no tenderness/mass/nodules Lungs: clear to auscultation bilaterally Breasts: normal appearance, no masses or tenderness Heart: regular rate and rhythm, S1, S2 normal, no murmur, click, rub or gallop Abdomen: soft, non-tender; bowel sounds normal; no masses,  no organomegaly Extremities: extremities normal, atraumatic, no cyanosis or edema Skin: Skin color, texture, turgor normal. No rashes or lesions Lymph nodes: Cervical, supraclavicular, and axillary nodes normal. Neurologic: Grossly normal    Assessment:    Healthy female exam.      Plan:     Encounter for gynecological examination without abnormal finding - Plan: CBC, TSH, Comprehensive metabolic panel, Hemoglobin A1c, Lipid panel, MM 3D SCREEN BREAST BILATERAL  Return in 1 year (on 08/13/2021).  See After Visit Summary for Counseling Recommendations

## 2020-08-13 NOTE — Patient Instructions (Signed)
Preventive Care 11-40 Years Old, Female Preventive care refers to lifestyle choices and visits with your health care provider that can promote health and wellness. This includes:  A yearly physical exam. This is also called an annual wellness visit.  Regular dental and eye exams.  Immunizations.  Screening for certain conditions.  Healthy lifestyle choices, such as: ? Eating a healthy diet. ? Getting regular exercise. ? Not using drugs or products that contain nicotine and tobacco. ? Limiting alcohol use. What can I expect for my preventive care visit? Physical exam Your health care provider may check your:  Height and weight. These may be used to calculate your BMI (body mass index). BMI is a measurement that tells if you are at a healthy weight.  Heart rate and blood pressure.  Body temperature.  Skin for abnormal spots. Counseling Your health care provider may ask you questions about your:  Past medical problems.  Family's medical history.  Alcohol, tobacco, and drug use.  Emotional well-being.  Home life and relationship well-being.  Sexual activity.  Diet, exercise, and sleep habits.  Work and work Statistician.  Access to firearms.  Method of birth control.  Menstrual cycle.  Pregnancy history. What immunizations do I need? Vaccines are usually given at various ages, according to a schedule. Your health care provider will recommend vaccines for you based on your age, medical history, and lifestyle or other factors, such as travel or where you work.   What tests do I need? Blood tests  Lipid and cholesterol levels. These may be checked every 5 years starting at age 64.  Hepatitis C test.  Hepatitis B test. Screening  Diabetes screening. This is done by checking your blood sugar (glucose) after you have not eaten for a while (fasting).  STD (sexually transmitted disease) testing, if you are at risk.  BRCA-related cancer screening. This may  be done if you have a family history of breast, ovarian, tubal, or peritoneal cancers.  Pelvic exam and Pap test. This may be done every 3 years starting at age 61. Starting at age 82, this may be done every 5 years if you have a Pap test in combination with an HPV test. Talk with your health care provider about your test results, treatment options, and if necessary, the need for more tests.   Follow these instructions at home: Eating and drinking  Eat a healthy diet that includes fresh fruits and vegetables, whole grains, lean protein, and low-fat dairy products.  Take vitamin and mineral supplements as recommended by your health care provider.  Do not drink alcohol if: ? Your health care provider tells you not to drink. ? You are pregnant, may be pregnant, or are planning to become pregnant.  If you drink alcohol: ? Limit how much you have to 0-1 drink a day. ? Be aware of how much alcohol is in your drink. In the [place] one drink equals one 12 oz bottle of beer (355 mL), one 5 oz glass of wine (148 mL), or one 1 oz glass of hard liquor (44 mL).   Lifestyle  Take daily care of your teeth and gums. Brush your teeth every morning and night with fluoride toothpaste. Floss one time each day.  Stay active. Exercise for at least 30 minutes 5 or more days each week.  Do not use any products that contain nicotine or tobacco, such as cigarettes, e-cigarettes, and chewing tobacco. If you need help quitting, ask your health care provider.  Do  not use drugs.  If you are sexually active, practice safe sex. Use a condom or other form of protection to prevent STIs (sexually transmitted infections).  If you do not wish to become pregnant, use a form of birth control. If you plan to become pregnant, see your health care provider for a prepregnancy visit.  Find healthy ways to cope with stress, such as: ? Meditation, yoga, or listening to music. ? Journaling. ? Talking to a trusted  person. ? Spending time with friends and family. Safety  Always wear your seat belt while driving or riding in a vehicle.  Do not drive: ? If you have been drinking alcohol. Do not ride with someone who has been drinking. ? When you are tired or distracted. ? While texting.  Wear a helmet and other protective equipment during sports activities.  If you have firearms in your house, make sure you follow all gun safety procedures.  Seek help if you have been physically or sexually abused. What's next?  Go to your health care provider once a year for an annual wellness visit.  Ask your health care provider how often you should have your eyes and teeth checked.  Stay up to date on all vaccines. This information is not intended to replace advice given to you by your health care provider. Make sure you discuss any questions you have with your health care provider. Document Revised: 01/05/2020 Document Reviewed: 01/18/2018 Elsevier Patient Education  2021 [employer].

## 2020-08-14 LAB — HEMOGLOBIN A1C
Est. average glucose Bld gHb Est-mCnc: 94 mg/dL
Hgb A1c MFr Bld: 4.9 % (ref 4.8–5.6)

## 2020-08-14 LAB — COMPREHENSIVE METABOLIC PANEL
ALT: 11 IU/L (ref 0–32)
AST: 14 IU/L (ref 0–40)
Albumin/Globulin Ratio: 2 (ref 1.2–2.2)
Albumin: 4.5 g/dL (ref 3.8–4.8)
Alkaline Phosphatase: 59 IU/L (ref 44–121)
BUN/Creatinine Ratio: 13 (ref 9–23)
BUN: 10 mg/dL (ref 6–20)
Bilirubin Total: 0.6 mg/dL (ref 0.0–1.2)
CO2: 21 mmol/L (ref 20–29)
Calcium: 9 mg/dL (ref 8.7–10.2)
Chloride: 101 mmol/L (ref 96–106)
Creatinine, Ser: 0.78 mg/dL (ref 0.57–1.00)
Globulin, Total: 2.2 g/dL (ref 1.5–4.5)
Glucose: 89 mg/dL (ref 65–99)
Potassium: 4.2 mmol/L (ref 3.5–5.2)
Sodium: 138 mmol/L (ref 134–144)
Total Protein: 6.7 g/dL (ref 6.0–8.5)
eGFR: 99 mL/min/{1.73_m2} (ref >59)

## 2020-08-14 LAB — LIPID PANEL
Chol/HDL Ratio: 2.3 ratio (ref 0.0–4.4)
Cholesterol, Total: 169 mg/dL (ref 100–199)
HDL: 73 mg/dL (ref >39)
LDL Chol Calc (NIH): 81 mg/dL (ref 0–99)
Triglycerides: 78 mg/dL (ref 0–149)
VLDL Cholesterol Cal: 15 mg/dL (ref 5–40)

## 2020-08-14 LAB — CBC
Hematocrit: 38.8 % (ref 34.0–46.6)
Hemoglobin: 12.7 g/dL (ref 11.1–15.9)
MCH: 31.4 pg (ref 26.6–33.0)
MCHC: 32.7 g/dL (ref 31.5–35.7)
MCV: 96 fL (ref 79–97)
Platelets: 311 10*3/uL (ref 150–450)
RBC: 4.05 x10E6/uL (ref 3.77–5.28)
RDW: 11.6 % — ABNORMAL LOW (ref 11.7–15.4)
WBC: 7.4 10*3/uL (ref 3.4–10.8)

## 2020-08-14 LAB — TSH: TSH: 0.842 u[IU]/mL (ref 0.450–4.500)

## 2020-09-28 ENCOUNTER — Other Ambulatory Visit: Payer: Self-pay

## 2020-09-28 ENCOUNTER — Ambulatory Visit
Admission: RE | Admit: 2020-09-28 | Discharge: 2020-09-28 | Disposition: A | Discharge status: Home / Self Care | Payer: BC Managed Care – PPO | Source: Ambulatory Visit | Attending: Family Medicine | Admitting: Family Medicine

## 2020-09-28 DIAGNOSIS — Z01419 Encounter for gynecological examination (general) (routine) without abnormal findings: Secondary | ICD-10-CM

## 2020-09-28 DIAGNOSIS — Z1231 Encounter for screening mammogram for malignant neoplasm of breast: Secondary | ICD-10-CM | POA: Diagnosis not present

## 2020-12-23 ENCOUNTER — Ambulatory Visit: Payer: BC Managed Care – PPO | Admitting: Dermatology

## 2020-12-23 ENCOUNTER — Other Ambulatory Visit: Payer: Self-pay

## 2020-12-23 DIAGNOSIS — L719 Rosacea, unspecified: Secondary | ICD-10-CM

## 2020-12-23 DIAGNOSIS — Z1283 Encounter for screening for malignant neoplasm of skin: Secondary | ICD-10-CM | POA: Diagnosis not present

## 2020-12-23 DIAGNOSIS — Z86018 Personal history of other benign neoplasm: Secondary | ICD-10-CM | POA: Diagnosis not present

## 2020-12-23 DIAGNOSIS — D225 Melanocytic nevi of trunk: Secondary | ICD-10-CM | POA: Diagnosis not present

## 2020-12-23 DIAGNOSIS — D229 Melanocytic nevi, unspecified: Secondary | ICD-10-CM

## 2020-12-23 DIAGNOSIS — Z85828 Personal history of other malignant neoplasm of skin: Secondary | ICD-10-CM | POA: Diagnosis not present

## 2020-12-23 DIAGNOSIS — L821 Other seborrheic keratosis: Secondary | ICD-10-CM

## 2020-12-23 DIAGNOSIS — L578 Other skin changes due to chronic exposure to nonionizing radiation: Secondary | ICD-10-CM

## 2020-12-23 DIAGNOSIS — D492 Neoplasm of unspecified behavior of bone, soft tissue, and skin: Secondary | ICD-10-CM

## 2020-12-23 DIAGNOSIS — D1800 Hemangioma unspecified site: Secondary | ICD-10-CM

## 2020-12-23 DIAGNOSIS — L814 Other melanin hyperpigmentation: Secondary | ICD-10-CM

## 2020-12-23 NOTE — Progress Notes (Signed)
Follow-Up Visit   Subjective  Danielle Sawyer is a 39 y.o. female who presents for the following: Total body skin exam (Hx of BCC left sup chest parasternal 3.0 cm inf to med clavicle, hx of multiple dysplastic nevi). The patient presents for Total-Body Skin Exam (TBSE) for skin cancer screening and mole check.  The following portions of the chart were reviewed this encounter and updated as appropriate:   Tobacco  Allergies  Meds  Problems  Med Hx  Surg Hx  Fam Hx     Review of Systems:  No other skin or systemic complaints except as noted in HPI or Assessment and Plan.  Objective  Well appearing patient in no apparent distress; mood and affect are within normal limits.  A full examination was performed including scalp, head, eyes, ears, nose, lips, neck, chest, axillae, abdomen, back, buttocks, bilateral upper extremities, bilateral lower extremities, hands, feet, fingers, toes, fingernails, and toenails. All findings within normal limits unless otherwise noted below.  L sup chest parasternal, 3.0cm inf to med clavicle Well healed scar with no evidence of recurrence.   multiple Scars with no evidence of recurrence.   face Small paps scattered on face  R infra axillary 0.6cm irregular brown macule   Assessment & Plan   Lentigines - Scattered tan macules - Due to sun exposure - Benign-appering, observe - Recommend daily broad spectrum sunscreen SPF 30+ to sun-exposed areas, reapply every 2 hours as needed. - Call for any changes  Seborrheic Keratoses - Stuck-on, waxy, tan-brown papules and/or plaques  - Benign-appearing - Discussed benign etiology and prognosis. - Observe - Call for any changes  Melanocytic Nevi - Tan-brown and/or pink-flesh-colored symmetric macules and papules - Benign appearing on exam today - Observation - Call clinic for new or changing moles - Recommend daily use of broad spectrum spf 30+ sunscreen to sun-exposed areas.    Hemangiomas - Red papules - Discussed benign nature - Observe - Call for any changes  Actinic Damage - Chronic condition, secondary to cumulative UV/sun exposure - diffuse scaly erythematous macules with underlying dyspigmentation - Recommend daily broad spectrum sunscreen SPF 30+ to sun-exposed areas, reapply every 2 hours as needed.  - Staying in the shade or wearing long sleeves, sun glasses (UVA+UVB protection) and wide brim hats (4-inch brim around the entire circumference of the hat) are also recommended for sun protection.  - Call for new or changing lesions.  Skin cancer screening performed today.  History of basal cell carcinoma (BCC) L sup chest parasternal, 3.0cm inf to med clavicle  Clear. Observe for recurrence. Call clinic for new or changing lesions.  Recommend regular skin exams, daily broad-spectrum spf 30+ sunscreen use, and photoprotection.    History of dysplastic nevus multiple  Clear. Observe for recurrence. Call clinic for new or changing lesions.  Recommend regular skin exams, daily broad-spectrum spf 30+ sunscreen use, and photoprotection.    Rosacea face  Rosacea is a chronic progressive skin condition usually affecting the face of adults, causing redness and/or acne bumps. It is treatable but not curable. It sometimes affects the eyes (ocular rosacea) as well. It may respond to topical and/or systemic medication and can flare with stress, sun exposure, alcohol, exercise and some foods.  Daily application of broad spectrum spf 30+ sunscreen to face is recommended to reduce flares.  Will prescribe [facility] metronidazole/ivermectin/azelaic acid twice daily as needed to affected areas on the face. The patient was advised this is not covered by insurance since  it is made by a compounding pharmacy. They will receive an email to check out and the medication will be mailed to their home.    Neoplasm of skin R infra axillary  Epidermal / dermal  shaving  Lesion diameter (cm):  0.6 Informed consent: discussed and consent obtained   Timeout: patient name, date of birth, surgical site, and procedure verified   Procedure prep:  Patient was prepped and draped in usual sterile fashion Prep type:  Isopropyl alcohol Anesthesia: the lesion was anesthetized in a standard fashion   Anesthetic:  1% lidocaine w/ epinephrine 1-100,000 buffered w/ 8.4% NaHCO3 Instrument used: flexible razor blade   Hemostasis achieved with: pressure, aluminum chloride and electrodesiccation   Outcome: patient tolerated procedure well   Post-procedure details: sterile dressing applied and wound care instructions given   Dressing type: bandage and bacitracin    Specimen 1 - Surgical pathology Differential Diagnosis: D48.5 Nevus vs Dysplastic Nevus  Check Margins: yes 0.6cm irregular brown macule  Related Medications mupirocin ointment (BACTROBAN) 2 % Apply 1 application topically daily. Qd to excision site  Skin cancer screening  Return in about 6 months (around 06/25/2021) for TBSE, Hx of Dysplastic nevi, Hx of BCC.  I, Othelia Pulling, RMA, am acting as scribe for Sarina Ser, MD . Documentation: I have reviewed the above documentation for accuracy and completeness, and I agree with the above.  Sarina Ser, MD

## 2020-12-23 NOTE — Patient Instructions (Addendum)
If you have any questions or concerns for your doctor, please call our main line at [phone] and press option 4 to reach your doctor's medical assistant. If no one answers, please leave a voicemail as directed and we will return your call as soon as possible. Messages left after 4 pm will be answered the following business day.   You may also send us a message via MyChart. We typically respond to MyChart messages within 1-2 business days.  For prescription refills, please ask your pharmacy to contact our office. Our fax number is [phone].  If you have an urgent issue when the clinic is closed that cannot wait until the next business day, you can page your doctor at the number below.    Please note that while we do our best to be available for urgent issues outside of office hours, we are not available 24/7.   If you have an urgent issue and are unable to reach us, you may choose to seek medical care at your doctor's office, retail clinic, urgent care center, or emergency room.  If you have a medical emergency, please immediately call 911 or go to the emergency department.  Pager Numbers  - Dr. Kowalski: [phone]  - Dr. Moye: [phone]  - Dr. Stewart: [phone]  In the event of inclement weather, please call our main line at [phone] for an update on the status of any delays or closures.  Dermatology Medication Tips: Please keep the boxes that topical medications come in in order to help keep track of the instructions about where and how to use these. Pharmacies typically print the medication instructions only on the boxes and not directly on the medication tubes.   If your medication is too expensive, please contact our office at [phone] option 4 or send us a message through MyChart.   We are unable to tell what your co-pay for medications will be in advance as this is different depending on your insurance coverage. However, we may be able to find a substitute  medication at lower cost or fill out paperwork to get insurance to cover a needed medication.   If a prior authorization is required to get your medication covered by your insurance company, please allow us 1-2 business days to complete this process.  Drug prices often vary depending on where the prescription is filled and some pharmacies may offer cheaper prices.  The website www.goodrx.com contains coupons for medications through different pharmacies. The prices here do not account for what the cost may be with help from insurance (it may be cheaper with your insurance), but the website can give you the price if you did not use any insurance.  - You can print the associated coupon and take it with your prescription to the pharmacy.  - You may also stop by our office during regular business hours and pick up a GoodRx coupon card.  - If you need your prescription sent electronically to a different pharmacy, notify our office through [facility] MyChart or by phone at [phone] option 4.   Wound Care Instructions  Cleanse wound gently with soap and water once a day then pat dry with clean gauze. Apply a thing coat of Petrolatum (petroleum jelly, "Vaseline") over the wound (unless you have an allergy to this). We recommend that you use a new, sterile tube of Vaseline. Do not pick or remove scabs. Do not remove the yellow or white "healing tissue" from the base of the wound.  Cover the   wound with fresh, clean, nonstick gauze and secure with paper tape. You may use Band-Aids in place of gauze and tape if the would is small enough, but would recommend trimming much of the tape off as there is often too much. Sometimes Band-Aids can irritate the skin.  You should call the office for your biopsy report after 1 week if you have not already been contacted.  If you experience any problems, such as abnormal amounts of bleeding, swelling, significant bruising, significant pain, or evidence of infection,  please call the office immediately.  FOR ADULT SURGERY PATIENTS: If you need something for pain relief you may take 1 extra strength Tylenol (acetaminophen) AND 2 Ibuprofen (200mg  each) together every 4 hours as needed for pain. (do not take these if you are allergic to them or if you have a reason you should not take them.) Typically, you may only need pain medication for 1 to 3 days.   Instructions for [facility] Medications  One or more of your medications was sent to the [facility] mail order compounding pharmacy. You will receive an email from them and can purchase the medicine through that link. It will then be mailed to your home at the address you confirmed. If for any reason you do not receive an email from them, please check your spam folder. If you still do not find the email, please let us know. [facility] phone number is [phone].

## 2020-12-24 ENCOUNTER — Encounter: Payer: Self-pay | Admitting: Dermatology

## 2020-12-29 ENCOUNTER — Telehealth: Payer: Self-pay

## 2020-12-29 NOTE — Telephone Encounter (Signed)
-----   Message from Ralene Bathe, MD sent at 12/29/2020 11:11 AM EDT ----- Diagnosis Skin , right infra axillary DYSPLASTIC COMPOUND NEVUS WITH MILD ATYPIA, DEEP MARGIN INVOLVED  Mild dysplastic Recheck next visit

## 2020-12-29 NOTE — Telephone Encounter (Signed)
Left pt msg to call for bx results/sh 

## 2020-12-31 ENCOUNTER — Telehealth: Payer: Self-pay

## 2020-12-31 NOTE — Telephone Encounter (Signed)
Advised patient of results/hd  

## 2020-12-31 NOTE — Telephone Encounter (Signed)
-----   Message from Ralene Bathe, MD sent at 12/29/2020 11:11 AM EDT ----- Diagnosis Skin , right infra axillary DYSPLASTIC COMPOUND NEVUS WITH MILD ATYPIA, DEEP MARGIN INVOLVED  Mild dysplastic Recheck next visit

## 2021-01-26 NOTE — Progress Notes (Addendum)
Cardiology Office Note  Date:  [id number]   ID:  Danielle Sawyer, DOB March 28, 1981, MRN [id number]  PCP:  Danielle Pitch, MD   Chief Complaint  Patient presents with   New Patient (Initial Visit)    Ref by Dr. Lovie Sawyer for post Covid symptoms of arrhythmia; patient is wearing a heart monitor ([employer]) that was placed by her PCP and recent found to be in A-Fib. Patient c/o chest pain,fluttering in chest, shortness of breath & palpitations.     HPI:  [name] is a 40 year old woman with no prior cardiac history COVID early August 2022 Who presents by referral from Dr. Lovie Sawyer for consultation of her chest pain, palpitations/tachycardia (possible atrial fibrillation per event monitor), post COVID  COVID 5 weeks ago Seen in clinic last week by primary care, has had palpitations, dull achy chest pain across her upper chest Gets worse with minimal exertion Bronchitis treated with ABX and prednisone  F/u CXR clear  Wearing 30 day event monitor 8/23 Was told she might have paroxysmal atrial fibrillation, Monitor downloads have been requested  Seen by PMD 8/30: palpitations, chest pain Waking up at night feels shaky Unable to exclude anxiety  EKG personally reviewed by myself on todays visit NSR rate 79 no ST or wave changes  PMH:   has a past medical history of Basal cell carcinoma (05/12/2016), Dysplastic nevus (05/06/2013), Dysplastic nevus (11/26/2013), Dysplastic nevus (12/04/2014), Dysplastic nevus (06/08/2015), Dysplastic nevus (08/14/2017), Dysplastic nevus (07/10/2019), Dysplastic nevus (01/08/2020), Dysplastic nevus (12/23/2020), and Medical history non-contributory.  PSH:    Past Surgical History:  Procedure Laterality Date   CESAREAN SECTION N/A 12/19/2012   Procedure: CESAREAN SECTION;  Surgeon: Danielle Mode, MD;  Location: [facility];  Service: Obstetrics;  Laterality: N/A;  primary   CESAREAN SECTION N/A 11/08/2015   Procedure: REPEAT CESAREAN SECTION;   Surgeon: Danielle Kind, MD;  Location: [facility];  Service: Obstetrics;  Laterality: N/A;  Cheree Ditto to RNFA   DILATION AND EVACUATION N/A 11/06/2014   Procedure: DILATATION AND EVACUATION UNDER ULTRASOUND GUIDANCE;  Surgeon: Danielle Oman, MD;  Location: [facility];  Service: Gynecology;  Laterality: N/A;   WISDOM TOOTH EXTRACTION     x3    Current Outpatient Medications  Medication Sig Dispense Refill   Multiple Vitamin (MULTIVITAMIN) tablet Take 1 tablet by mouth daily.     mupirocin ointment (BACTROBAN) 2 % Apply 1 application topically daily. Qd to excision site (Patient not taking: No sig reported) 22 g 0   No current facility-administered medications for this visit.    Allergies:   Patient has no known allergies.   Social History:  The patient  reports that she has never smoked. She has never used smokeless tobacco. She reports current alcohol use of about 7.0 - 8.0 standard drinks per week. She reports that she does not use drugs.   Family History:   family history includes Breast cancer in her maternal aunt, maternal grandmother, and paternal grandmother; Cancer in her maternal aunt, maternal grandfather, maternal grandmother, paternal aunt, paternal aunt, paternal grandfather, and paternal grandmother; Heart disease in her father.    Review of Systems: Review of Systems  Constitutional: Negative.   HENT: Negative.    Respiratory: Negative.    Cardiovascular:  Positive for chest pain and palpitations.  Gastrointestinal: Negative.   Musculoskeletal: Negative.   Neurological: Negative.   Psychiatric/Behavioral: Negative.    All other systems reviewed and are negative.   PHYSICAL EXAM: VS:  BP 120/60 (  BP Location: Right Arm, Patient Position: Sitting, Cuff Size: Normal)   Pulse 79   Ht '5\' 6"'$  (1.676 m)   Wt 142 lb (64.4 kg)   SpO2 99%   BMI 22.92 kg/m  , BMI Body mass index is 22.92 kg/m. GEN: Well nourished, well developed, in no acute distress HEENT:  normal Neck: no JVD, carotid bruits, or masses Cardiac: RRR; no murmurs, rubs, or gallops,no edema  Respiratory:  clear to auscultation bilaterally, normal work of breathing GI: soft, nontender, nondistended, + BS MS: no deformity or atrophy Skin: warm and dry, no rash Neuro:  Strength and sensation are intact Psych: euthymic mood, full affect   Recent Labs: 08/13/2020: ALT 11; BUN 10; Creatinine, Ser 0.78; Hemoglobin 12.7; Platelets 311; Potassium 4.2; Sodium 138; TSH 0.842    Lipid Panel Lab Results  Component Value Date   CHOL 169 08/13/2020   HDL 73 08/13/2020   LDLCALC 81 08/13/2020   TRIG 78 08/13/2020      Wt Readings from Last 3 Encounters:  01/27/21 142 lb (64.4 kg)  08/13/20 145 lb 12.8 oz (66.1 kg)  06/10/19 147 lb (66.7 kg)       ASSESSMENT AND PLAN:  Problem List Items Addressed This Visit   None Visit Diagnoses     Chest pain of uncertain etiology    -  Primary   Palpitations       COVID          Tachycardia/palpitations Was told by primary care she was having paroxysmal atrial fibrillation Records have been requested including telemetry strips from event monitor -We recommended she start metoprolol tartrate 25 twice daily for symptoms while we confirm -If she in fact does have atrial fibrillation, chads vasc would be low, anticoagulation would likely not be required.  We would pursue rate rhythm strategy with beta-blockers if tolerated -Reports prior palpitations prior to COVID.  Suspect either APCs or PVCs -Echocardiogram has been ordered to rule out structural heart disease  COVID-19 infection Early August 2022, recovering May be contributing to symptoms as above Has been treated with antibiotics, prednisone for suspected bronchitis  Addendum Event monitor rhythm strips received from primary care This shows normal sinus rhythm, select strips with narrow complex regular rhythm artifact, does not appear to be atrial fibrillation or  flutter -Message sent to nursing to update Ms. Wortmann   Total encounter time more than 60 minutes  Greater than 50% was spent in counseling and coordination of care with the patient  Patient seen in consultation for Dr. Lovie Sawyer will be referred back to his office for ongoing care of the issues detailed above  Signed, Esmond Plants, M.D., Ph.D. [place], [place]

## 2021-01-27 ENCOUNTER — Encounter: Payer: Self-pay | Admitting: Cardiovascular Disease

## 2021-01-27 ENCOUNTER — Ambulatory Visit: Payer: BC Managed Care – PPO | Admitting: Cardiovascular Disease

## 2021-01-27 ENCOUNTER — Other Ambulatory Visit: Payer: Self-pay

## 2021-01-27 VITALS — BP 120/60 | HR 79 | Ht 66.0 in | Wt 142.0 lb

## 2021-01-27 DIAGNOSIS — I479 Paroxysmal tachycardia, unspecified: Secondary | ICD-10-CM | POA: Diagnosis not present

## 2021-01-27 DIAGNOSIS — U071 COVID-19: Secondary | ICD-10-CM | POA: Diagnosis not present

## 2021-01-27 DIAGNOSIS — R002 Palpitations: Secondary | ICD-10-CM

## 2021-01-27 DIAGNOSIS — R079 Chest pain, unspecified: Secondary | ICD-10-CM | POA: Diagnosis not present

## 2021-01-27 MED ORDER — METOPROLOL TARTRATE 25 MG PO TABS: 25.0000 mg | ORAL_TABLET | Freq: Two times a day (BID) | ORAL | 3 refills | Status: DC

## 2021-01-27 NOTE — Patient Instructions (Addendum)
We will try to obtain the preventice monitor strips from PMD  Medication Instructions:   Please START metoprolol tartrate 25 mg twice a day  If you need a refill on your cardiac medications before your next appointment, please call your pharmacy.   Lab work: No new labs needed  Testing/Procedures: ECHO  Your physician has requested that you have an echocardiogram. Echocardiography is a painless test that uses sound waves to create images of your heart. It provides your doctor with information about the size and shape of your heart and how well your heart's chambers and valves are working. This procedure takes approximately one hour. There are no restrictions for this procedure.  There is a possibility that an IV may need to be started during your test to inject an image enhancing agent. This is done to obtain more optimal pictures of your heart. Therefore we ask that you do at least drink some water prior to coming in to hydrate your veins.    Follow-Up: At [facility], you and your health needs are our priority.  As part of our continuing mission to provide you with exceptional heart care, we have created designated Provider Care Teams.  These Care Teams include your primary Cardiologist (physician) and Advanced Practice Providers (APPs -  Physician Assistants and Nurse Practitioners) who all work together to provide you with the care you need, when you need it.  You will need a follow up appointment as needed  Providers on your designated Care Team:   Murray Hodgkins, NP Christell Faith, PA-C Marrianne Mood, PA-C Cadence [place], [place]  COVID-19 Vaccine Information can be found at: ShippingScam.co.uk For questions related to vaccine distribution or appointments, please email [email or web address] or call [phone].

## 2021-02-23 ENCOUNTER — Other Ambulatory Visit: Payer: Self-pay

## 2021-02-23 MED ORDER — METOPROLOL TARTRATE 25 MG PO TABS: 25.0000 mg | ORAL_TABLET | Freq: Two times a day (BID) | ORAL | 3 refills | Status: DC

## 2021-02-23 NOTE — Telephone Encounter (Signed)
Per Dr. Rockey Situ (refer to MyChart messages)  She can decrease metoprolol to tartrate down to 12.5 twice daily, stay hydrated  That would be half pill twice a day  They do make it in a once a day and potentially we could change to metoprolol succinate 25 daily which she could take at dinnertime if she would prefer  Thx  [place]

## 2021-02-25 ENCOUNTER — Ambulatory Visit (INDEPENDENT_AMBULATORY_CARE_PROVIDER_SITE_OTHER): Payer: BC Managed Care – PPO

## 2021-02-25 ENCOUNTER — Other Ambulatory Visit: Payer: Self-pay

## 2021-02-25 DIAGNOSIS — I479 Paroxysmal tachycardia, unspecified: Secondary | ICD-10-CM | POA: Diagnosis not present

## 2021-02-25 DIAGNOSIS — U071 COVID-19: Secondary | ICD-10-CM | POA: Diagnosis not present

## 2021-02-25 DIAGNOSIS — R002 Palpitations: Secondary | ICD-10-CM | POA: Diagnosis not present

## 2021-02-28 LAB — ECHOCARDIOGRAM COMPLETE
AR max vel: 2.54 cm2
AV Area VTI: 2.41 cm2
AV Area mean vel: 2.38 cm2
AV Mean grad: 4 mmHg
AV Peak grad: 7.4 mmHg
Ao pk vel: 1.36 m/s
Area-P 1/2: 3.24 cm2
Calc EF: 61.6 %
S' Lateral: 3 cm
Single Plane A2C EF: 66.9 %
Single Plane A4C EF: 61 %

## 2021-03-09 ENCOUNTER — Telehealth: Payer: Self-pay

## 2021-03-09 NOTE — Telephone Encounter (Signed)
Left detail message on VM of pt's recent results okay by DPR, Dr. Rockey Situ advised   "Echocardiogram  Normal LV function, normal RV function, no significant valve disease, overall normal study "  At this time, no further recommendations or medications changes, advised to call office for any concerns or questions, otherwise will see at next visit.   Dr. Rockey Situ posted to pt's MyChart of her results and his comments

## 2021-06-26 ENCOUNTER — Encounter: Payer: Self-pay | Admitting: Radiology

## 2021-07-05 ENCOUNTER — Other Ambulatory Visit: Payer: Self-pay

## 2021-07-05 ENCOUNTER — Ambulatory Visit: Payer: BC Managed Care – PPO | Admitting: Dermatology

## 2021-07-05 DIAGNOSIS — D229 Melanocytic nevi, unspecified: Secondary | ICD-10-CM

## 2021-07-05 DIAGNOSIS — Z85828 Personal history of other malignant neoplasm of skin: Secondary | ICD-10-CM | POA: Diagnosis not present

## 2021-07-05 DIAGNOSIS — L814 Other melanin hyperpigmentation: Secondary | ICD-10-CM

## 2021-07-05 DIAGNOSIS — L821 Other seborrheic keratosis: Secondary | ICD-10-CM

## 2021-07-05 DIAGNOSIS — L578 Other skin changes due to chronic exposure to nonionizing radiation: Secondary | ICD-10-CM | POA: Diagnosis not present

## 2021-07-05 DIAGNOSIS — L853 Xerosis cutis: Secondary | ICD-10-CM

## 2021-07-05 DIAGNOSIS — Z1283 Encounter for screening for malignant neoplasm of skin: Secondary | ICD-10-CM

## 2021-07-05 DIAGNOSIS — D1800 Hemangioma unspecified site: Secondary | ICD-10-CM

## 2021-07-05 DIAGNOSIS — Z86018 Personal history of other benign neoplasm: Secondary | ICD-10-CM

## 2021-07-05 NOTE — Progress Notes (Signed)
° °  Follow-Up Visit   Subjective  Danielle Sawyer is a 41 y.o. female who presents for the following: Annual Exam (History of BCC and Dysplastic nevi - TBSE today). The patient presents for Total-Body Skin Exam (TBSE) for skin cancer screening and mole check.  The patient has spots, moles and lesions to be evaluated, some may be new or changing and the patient has concerns that these could be cancer.  The following portions of the chart were reviewed this encounter and updated as appropriate:   Tobacco   Allergies   Meds   Problems   Med Hx   Surg Hx   Fam Hx      Review of Systems:  No other skin or systemic complaints except as noted in HPI or Assessment and Plan.  Objective  Well appearing patient in no apparent distress; mood and affect are within normal limits.  A full examination was performed including scalp, head, eyes, ears, nose, lips, neck, chest, axillae, abdomen, back, buttocks, bilateral upper extremities, bilateral lower extremities, hands, feet, fingers, toes, fingernails, and toenails. All findings within normal limits unless otherwise noted below.  Xerosis   Assessment & Plan   History of Dysplastic Nevi - No evidence of recurrence today - Recommend regular full body skin exams - Recommend daily broad spectrum sunscreen SPF 30+ to sun-exposed areas, reapply every 2 hours as needed.  - Call if any new or changing lesions are noted between office visits  Lentigines - Scattered tan macules - Due to sun exposure - Benign-appearing, observe - Recommend daily broad spectrum sunscreen SPF 30+ to sun-exposed areas, reapply every 2 hours as needed. - Call for any changes  Seborrheic Keratoses - Stuck-on, waxy, tan-brown papules and/or plaques  - Benign-appearing - Discussed benign etiology and prognosis. - Observe - Call for any changes  Melanocytic Nevi - slightly irregular - Tan-brown and/or pink-flesh-colored symmetric macules and papules - Benign appearing on  exam today - Observation - Call clinic for new or changing moles - Recommend daily use of broad spectrum spf 30+ sunscreen to sun-exposed areas.   Hemangiomas - Red papules - Discussed benign nature - Observe - Call for any changes  Actinic Damage - Chronic condition, secondary to cumulative UV/sun exposure - diffuse scaly erythematous macules with underlying dyspigmentation - Recommend daily broad spectrum sunscreen SPF 30+ to sun-exposed areas, reapply every 2 hours as needed.  - Staying in the shade or wearing long sleeves, sun glasses (UVA+UVB protection) and wide brim hats (4-inch brim around the entire circumference of the hat) are also recommended for sun protection.  - Call for new or changing lesions.  Skin cancer screening performed today.  History of basal cell carcinoma (BCC) left sup chest parasternal 3.0 cm inf to med clavicle. Superficial and nodualr patterns. Clear. Observe for recurrence. Call clinic for new or changing lesions.  Recommend regular skin exams, daily broad-spectrum spf 30+ sunscreen use, and photoprotection.    Xerosis cutis Recommend Cerave cream or AmLactin Rapid Relief   Skin cancer screening  Return in about 6 months (around 01/02/2022).  I, Ashok Cordia, CMA, am acting as scribe for Sarina Ser, MD . Documentation: I have reviewed the above documentation for accuracy and completeness, and I agree with the above.  Sarina Ser, MD

## 2021-07-05 NOTE — Patient Instructions (Addendum)
Recommend Cerave cream or AmLactin Rapid Relief lotion   If You Need Anything After Your Visit  If you have any questions or concerns for your doctor, please call our main line at [phone] and press option 4 to reach your doctor's medical assistant. If no one answers, please leave a voicemail as directed and we will return your call as soon as possible. Messages left after 4 pm will be answered the following business day.   You may also send [place] a message via [place]. We typically respond to MyChart messages within 1-2 business days.  For prescription refills, please ask your pharmacy to contact our office. Our fax number is [phone].  If you have an urgent issue when the clinic is closed that cannot wait until the next business day, you can page your doctor at the number below.    Please note that while we do our best to be available for urgent issues outside of office hours, we are not available 24/7.   If you have an urgent issue and are unable to reach [place], you may choose to seek medical care at your doctor's office, retail clinic, urgent care center, or emergency room.  If you have a medical emergency, please immediately call 911 or go to the emergency department.  Pager Numbers  - Dr. Nehemiah Massed: [phone]  - Dr. Laurence Ferrari: [phone]  - Dr. Nicole Kindred: [phone]  In the event of inclement weather, please call our main line at [phone] for an update on the status of any delays or closures.  Dermatology Medication Tips: Please keep the boxes that topical medications come in in order to help keep track of the instructions about where and how to use these. Pharmacies typically print the medication instructions only on the boxes and not directly on the medication tubes.   If your medication is too expensive, please contact our office at [phone] option 4 or send [place] a message through [place].   We are unable to tell what your co-pay for medications will be in advance as  this is different depending on your insurance coverage. However, we may be able to find a substitute medication at lower cost or fill out paperwork to get insurance to cover a needed medication.   If a prior authorization is required to get your medication covered by your insurance company, please allow [place] 1-2 business days to complete this process.  Drug prices often vary depending on where the prescription is filled and some pharmacies may offer cheaper prices.  The website www.goodrx.com contains coupons for medications through different pharmacies. The prices here do not account for what the cost may be with help from insurance (it may be cheaper with your insurance), but the website can give you the price if you did not use any insurance.  - You can print the associated coupon and take it with your prescription to the pharmacy.  - You may also stop by our office during regular business hours and pick up a GoodRx coupon card.  - If you need your prescription sent electronically to a different pharmacy, notify our office through [facility] or by phone at [phone] option 4.     Si Usted Necesita Algo Despus de Su Visita  Tambin puede enviarnos un mensaje a travs de Pharmacist, community. Por lo general respondemos a los mensajes de MyChart en el transcurso de 1 a 2 das hbiles.  Para renovar recetas, por favor pida a su farmacia que se ponga en contacto con nuestra oficina.  Harland Dingwall de fax es [place] [phone].  Si tiene un asunto urgente cuando la clnica est cerrada y que no puede esperar hasta el siguiente da hbil, puede llamar/localizar a su doctor(a) al nmero que aparece a continuacin.   Por favor, tenga en cuenta que aunque hacemos todo lo posible para estar disponibles para asuntos urgentes fuera del horario de [place], no estamos disponibles las 24 horas del da, los 7 das de la [place].   Si tiene un problema urgente y no puede comunicarse con nosotros, puede optar por  buscar atencin mdica  en el consultorio de su doctor(a), en una clnica privada, en un centro de atencin urgente o en una sala de emergencias.  Si tiene Engineering geologist, por favor llame inmediatamente al 911 o vaya a la sala de emergencias.  Nmeros de bper  - Dr. Nehemiah Massed: [phone]  - Dra. Moye: [phone]  - Dra. Nicole Kindred: [phone]  En caso de inclemencias del [place], por favor llame a Johnsie Kindred principal al [phone] para una actualizacin sobre el [place] de cualquier retraso o cierre.  Consejos para la medicacin en dermatologa: Por favor, guarde las cajas en las que vienen los medicamentos de uso tpico para ayudarle a seguir las instrucciones sobre dnde y cmo usarlos. Las farmacias generalmente imprimen las instrucciones del medicamento slo en las cajas y no directamente en los tubos del [place].   Si su medicamento es muy caro, por favor, pngase en contacto con Zigmund Daniel llamando al [phone] y presione la opcin 4 o envenos un mensaje a travs de Pharmacist, community.   No podemos decirle cul ser su copago por los medicamentos por adelantado ya que esto es diferente dependiendo de la cobertura de su seguro. Sin embargo, es posible que podamos encontrar un medicamento sustituto a Electrical engineer un formulario para que el seguro cubra el medicamento que se considera necesario.   Si se requiere una autorizacin previa para que su compaa de seguros [place] su medicamento, por favor permtanos de 1 a 2 das hbiles para completar este proceso.  Los precios de los medicamentos varan con frecuencia dependiendo del Environmental consultant de dnde se surte la receta y alguna farmacias pueden ofrecer precios ms baratos.  El sitio web www.goodrx.com tiene cupones para medicamentos de Airline pilot. Los precios aqu no tienen en cuenta lo que podra costar con la ayuda del seguro (puede ser ms barato con su seguro), pero el sitio web puede darle el precio si no  utiliz Research scientist (physical sciences).  - Puede imprimir el cupn correspondiente y llevarlo con su receta a la farmacia.  - Tambin puede pasar por nuestra oficina durante el horario de atencin regular y Charity fundraiser una tarjeta de cupones de GoodRx.  - Si necesita que su receta se enve electrnicamente a una farmacia diferente, informe a nuestra oficina a travs de MyChart de [place] o por telfono llamando al [phone] y presione la opcin 4.

## 2021-07-09 ENCOUNTER — Encounter: Payer: Self-pay | Admitting: Dermatology

## 2021-08-18 DIAGNOSIS — R002 Palpitations: Secondary | ICD-10-CM | POA: Insufficient documentation

## 2021-10-04 ENCOUNTER — Other Ambulatory Visit: Payer: Self-pay | Admitting: Family Medicine

## 2021-10-04 DIAGNOSIS — Z1231 Encounter for screening mammogram for malignant neoplasm of breast: Secondary | ICD-10-CM

## 2021-10-20 ENCOUNTER — Encounter: Payer: Self-pay | Admitting: Family Medicine

## 2021-10-20 ENCOUNTER — Ambulatory Visit (INDEPENDENT_AMBULATORY_CARE_PROVIDER_SITE_OTHER): Payer: BC Managed Care – PPO | Admitting: Family Medicine

## 2021-10-20 VITALS — BP 117/75 | HR 80 | Wt 155.0 lb

## 2021-10-20 DIAGNOSIS — Z01411 Encounter for gynecological examination (general) (routine) with abnormal findings: Secondary | ICD-10-CM | POA: Diagnosis not present

## 2021-10-20 NOTE — Patient Instructions (Signed)

## 2021-10-20 NOTE — Progress Notes (Signed)
Subjective:     Danielle Sawyer is a 41 y.o. female and is here for a comprehensive physical exam. The patient reports no problems.    The following portions of the patient's history were reviewed and updated as appropriate: allergies, current medications, past family history, past medical history, past social history, past surgical history, and problem list.  Review of Systems Pertinent items noted in HPI and remainder of comprehensive ROS otherwise negative.   Objective:    BP 117/75   Pulse 80   Wt 155 lb (70.3 kg)   LMP 09/28/2021   BMI 25.02 kg/m  General appearance: alert, cooperative, and appears stated age Head: Normocephalic, without obvious abnormality, atraumatic Neck: no adenopathy, supple, symmetrical, trachea midline, and thyroid not enlarged, symmetric, no tenderness/mass/nodules Lungs: clear to auscultation bilaterally Breasts: normal appearance, no masses or tenderness Heart: regular rate and rhythm, S1, S2 normal, no murmur, click, rub or gallop Abdomen: soft, non-tender; bowel sounds normal; no masses,  no organomegaly Pelvic: cervix normal in appearance, external genitalia normal, no adnexal masses or tenderness, no cervical motion tenderness, rectovaginal septum normal, uterus normal size, shape, and consistency, and vagina normal without discharge Extremities: extremities normal, atraumatic, no cyanosis or edema Pulses: 2+ and symmetric Skin: Skin color, texture, turgor normal. No rashes or lesions Lymph nodes: Cervical, supraclavicular, and axillary nodes normal. Neurologic: Grossly normal    Assessment:    Healthy female exam.      Plan:    Encounter for gynecological examination with abnormal finding - pap is updatedAnnual labsMammogram sheduled. - Plan: CBC, TSH, Comprehensive metabolic panel, Hemoglobin A1c, Lipid panel  Return in 1 year (on 10/21/2022).  See After Visit Summary for Counseling Recommendations

## 2021-10-20 NOTE — Progress Notes (Signed)
LMP:09/28/2021 Monthly  Last Pap:06/10/2019 Last Mammogram: 09/28/20 Scheduled already for 11/03/21 Contraception:no method STD Screening:No Family Hx of Breast Cancer:Yes Family Hx of Ovarian Cancer:No  CC: None

## 2021-10-21 LAB — COMPREHENSIVE METABOLIC PANEL
ALT: 15 IU/L (ref 0–32)
AST: 14 IU/L (ref 0–40)
Albumin/Globulin Ratio: 2 (ref 1.2–2.2)
Albumin: 4.7 g/dL (ref 3.8–4.8)
Alkaline Phosphatase: 67 IU/L (ref 44–121)
BUN/Creatinine Ratio: 18 (ref 9–23)
BUN: 14 mg/dL (ref 6–24)
Bilirubin Total: 0.6 mg/dL (ref 0.0–1.2)
CO2: 24 mmol/L (ref 20–29)
Calcium: 9.6 mg/dL (ref 8.7–10.2)
Chloride: 101 mmol/L (ref 96–106)
Creatinine, Ser: 0.78 mg/dL (ref 0.57–1.00)
Globulin, Total: 2.3 g/dL (ref 1.5–4.5)
Glucose: 107 mg/dL — ABNORMAL HIGH (ref 70–99)
Potassium: 3.7 mmol/L (ref 3.5–5.2)
Sodium: 139 mmol/L (ref 134–144)
Total Protein: 7 g/dL (ref 6.0–8.5)
eGFR: 98 mL/min/{1.73_m2} (ref >59)

## 2021-10-21 LAB — HEMOGLOBIN A1C
Est. average glucose Bld gHb Est-mCnc: 94 mg/dL
Hgb A1c MFr Bld: 4.9 % (ref 4.8–5.6)

## 2021-10-21 LAB — CBC
Hematocrit: 40.1 % (ref 34.0–46.6)
Hemoglobin: 13.4 g/dL (ref 11.1–15.9)
MCH: 31.9 pg (ref 26.6–33.0)
MCHC: 33.4 g/dL (ref 31.5–35.7)
MCV: 96 fL (ref 79–97)
Platelets: 307 10*3/uL (ref 150–450)
RBC: 4.2 x10E6/uL (ref 3.77–5.28)
RDW: 11.4 % — ABNORMAL LOW (ref 11.7–15.4)
WBC: 8.4 10*3/uL (ref 3.4–10.8)

## 2021-10-21 LAB — LIPID PANEL
Chol/HDL Ratio: 2.7 ratio (ref 0.0–4.4)
Cholesterol, Total: 205 mg/dL — ABNORMAL HIGH (ref 100–199)
HDL: 77 mg/dL (ref >39)
LDL Chol Calc (NIH): 113 mg/dL — ABNORMAL HIGH (ref 0–99)
Triglycerides: 85 mg/dL (ref 0–149)
VLDL Cholesterol Cal: 15 mg/dL (ref 5–40)

## 2021-10-21 LAB — TSH: TSH: 1 u[IU]/mL (ref 0.450–4.500)

## 2021-11-03 ENCOUNTER — Ambulatory Visit
Admission: RE | Admit: 2021-11-03 | Discharge: 2021-11-03 | Disposition: A | Discharge status: Home / Self Care | Payer: BC Managed Care – PPO | Source: Ambulatory Visit | Attending: Family Medicine | Admitting: Family Medicine

## 2021-11-03 DIAGNOSIS — Z1231 Encounter for screening mammogram for malignant neoplasm of breast: Secondary | ICD-10-CM | POA: Diagnosis present

## 2022-01-17 ENCOUNTER — Encounter: Payer: Self-pay | Admitting: Dermatology

## 2022-01-17 ENCOUNTER — Ambulatory Visit: Payer: BC Managed Care – PPO | Admitting: Dermatology

## 2022-01-17 DIAGNOSIS — L719 Rosacea, unspecified: Secondary | ICD-10-CM

## 2022-01-17 DIAGNOSIS — Z1283 Encounter for screening for malignant neoplasm of skin: Secondary | ICD-10-CM

## 2022-01-17 DIAGNOSIS — D229 Melanocytic nevi, unspecified: Secondary | ICD-10-CM

## 2022-01-17 DIAGNOSIS — D239 Other benign neoplasm of skin, unspecified: Secondary | ICD-10-CM

## 2022-01-17 DIAGNOSIS — L814 Other melanin hyperpigmentation: Secondary | ICD-10-CM

## 2022-01-17 DIAGNOSIS — L578 Other skin changes due to chronic exposure to nonionizing radiation: Secondary | ICD-10-CM | POA: Diagnosis not present

## 2022-01-17 DIAGNOSIS — Z85828 Personal history of other malignant neoplasm of skin: Secondary | ICD-10-CM

## 2022-01-17 DIAGNOSIS — L821 Other seborrheic keratosis: Secondary | ICD-10-CM

## 2022-01-17 DIAGNOSIS — D225 Melanocytic nevi of trunk: Secondary | ICD-10-CM

## 2022-01-17 DIAGNOSIS — D1800 Hemangioma unspecified site: Secondary | ICD-10-CM

## 2022-01-17 DIAGNOSIS — Z86018 Personal history of other benign neoplasm: Secondary | ICD-10-CM

## 2022-01-17 NOTE — Patient Instructions (Signed)
     Melanoma ABCDEs  Melanoma is the most dangerous type of skin cancer, and is the leading cause of death from skin disease.  You are more likely to develop melanoma if you: Have light-colored skin, light-colored eyes, or red or blond hair Spend a lot of time in the sun Tan regularly, either outdoors or in a tanning bed Have had blistering sunburns, especially during childhood Have a close family member who has had a melanoma Have atypical moles or large birthmarks  Early detection of melanoma is key since treatment is typically straightforward and cure rates are extremely high if we catch it early.   The first sign of melanoma is often a change in a mole or a new dark spot.  The ABCDE system is a way of remembering the signs of melanoma.  A for asymmetry:  The two halves do not match. B for border:  The edges of the growth are irregular. C for color:  A mixture of colors are present instead of an even brown color. D for diameter:  Melanomas are usually (but not always) greater than 6mm - the size of a pencil eraser. E for evolution:  The spot keeps changing in size, shape, and color.  Please check your skin once per month between visits. You can use a small mirror in front and a large mirror behind you to keep an eye on the back side or your body.   If you see any new or changing lesions before your next follow-up, please call to schedule a visit.  Please continue daily skin protection including broad spectrum sunscreen SPF 30+ to sun-exposed areas, reapplying every 2 hours as needed when you're outdoors.   Staying in the shade or wearing long sleeves, sun glasses (UVA+UVB protection) and wide brim hats (4-inch brim around the entire circumference of the hat) are also recommended for sun protection.    Due to recent changes in healthcare laws, you may see results of your pathology and/or laboratory studies on MyChart before the doctors have had a chance to review them. We  understand that in some cases there may be results that are confusing or concerning to you. Please understand that not all results are received at the same time and often the doctors may need to interpret multiple results in order to provide you with the best plan of care or course of treatment. Therefore, we ask that you please give us 2 business days to thoroughly review all your results before contacting the office for clarification. Should we see a critical lab result, you will be contacted sooner.   If You Need Anything After Your Visit  If you have any questions or concerns for your doctor, please call our main line at [phone] and press option 4 to reach your doctor's medical assistant. If no one answers, please leave a voicemail as directed and we will return your call as soon as possible. Messages left after 4 pm will be answered the following business day.   You may also send us a message via MyChart. We typically respond to MyChart messages within 1-2 business days.  For prescription refills, please ask your pharmacy to contact our office. Our fax number is [phone].  If you have an urgent issue when the clinic is closed that cannot wait until the next business day, you can page your doctor at the number below.    Please note that while we do our best to be available for urgent issues   outside of office hours, we are not available 24/7.   If you have an urgent issue and are unable to reach us, you may choose to seek medical care at your doctor's office, retail clinic, urgent care center, or emergency room.  If you have a medical emergency, please immediately call 911 or go to the emergency department.  Pager Numbers  - Dr. Kowalski: [phone]  - Dr. Moye: [phone]  - Dr. Stewart: [phone]  In the event of inclement weather, please call our main line at [phone] for an update on the status of any delays or closures.  Dermatology Medication Tips: Please  keep the boxes that topical medications come in in order to help keep track of the instructions about where and how to use these. Pharmacies typically print the medication instructions only on the boxes and not directly on the medication tubes.   If your medication is too expensive, please contact our office at [phone] option 4 or send us a message through MyChart.   We are unable to tell what your co-pay for medications will be in advance as this is different depending on your insurance coverage. However, we may be able to find a substitute medication at lower cost or fill out paperwork to get insurance to cover a needed medication.   If a prior authorization is required to get your medication covered by your insurance company, please allow us 1-2 business days to complete this process.  Drug prices often vary depending on where the prescription is filled and some pharmacies may offer cheaper prices.  The website www.goodrx.com contains coupons for medications through different pharmacies. The prices here do not account for what the cost may be with help from insurance (it may be cheaper with your insurance), but the website can give you the price if you did not use any insurance.  - You can print the associated coupon and take it with your prescription to the pharmacy.  - You may also stop by our office during regular business hours and pick up a GoodRx coupon card.  - If you need your prescription sent electronically to a different pharmacy, notify our office through  MyChart or by phone at [phone] option 4.     Si Usted Necesita Algo Despus de Su Visita  Tambin puede enviarnos un mensaje a travs de MyChart. Por lo general respondemos a los mensajes de MyChart en el transcurso de 1 a 2 das hbiles.  Para renovar recetas, por favor pida a su farmacia que se ponga en contacto con nuestra oficina. Nuestro nmero de fax es el [phone].  Si tiene un asunto urgente  cuando la clnica est cerrada y que no puede esperar hasta el siguiente da hbil, puede llamar/localizar a su doctor(a) al nmero que aparece a continuacin.   Por favor, tenga en cuenta que aunque hacemos todo lo posible para estar disponibles para asuntos urgentes fuera del horario de oficina, no estamos disponibles las 24 horas del da, los 7 das de la semana.   Si tiene un problema urgente y no puede comunicarse con nosotros, puede optar por buscar atencin mdica  en el consultorio de su doctor(a), en una clnica privada, en un centro de atencin urgente o en una sala de emergencias.  Si tiene una emergencia mdica, por favor llame inmediatamente al 911 o vaya a la sala de emergencias.  Nmeros de bper  - Dr. Kowalski: [phone]  - Dra. Moye: [phone]  - Dra. Stewart: [phone]  En caso   de inclemencias del tiempo, por favor llame a nuestra lnea principal al [phone] para una actualizacin sobre el estado de cualquier retraso o cierre.  Consejos para la medicacin en dermatologa: Por favor, guarde las cajas en las que vienen los medicamentos de uso tpico para ayudarle a seguir las instrucciones sobre dnde y cmo usarlos. Las farmacias generalmente imprimen las instrucciones del medicamento slo en las cajas y no directamente en los tubos del medicamento.   Si su medicamento es muy caro, por favor, pngase en contacto con nuestra oficina llamando al [phone] y presione la opcin 4 o envenos un mensaje a travs de MyChart.   No podemos decirle cul ser su copago por los medicamentos por adelantado ya que esto es diferente dependiendo de la cobertura de su seguro. Sin embargo, es posible que podamos encontrar un medicamento sustituto a menor costo o llenar un formulario para que el seguro cubra el medicamento que se considera necesario.   Si se requiere una autorizacin previa para que su compaa de seguros cubra su medicamento, por favor permtanos de 1 a 2  das hbiles para completar este proceso.  Los precios de los medicamentos varan con frecuencia dependiendo del lugar de dnde se surte la receta y alguna farmacias pueden ofrecer precios ms baratos.  El sitio web www.goodrx.com tiene cupones para medicamentos de diferentes farmacias. Los precios aqu no tienen en cuenta lo que podra costar con la ayuda del seguro (puede ser ms barato con su seguro), pero el sitio web puede darle el precio si no utiliz ningn seguro.  - Puede imprimir el cupn correspondiente y llevarlo con su receta a la farmacia.  - Tambin puede pasar por nuestra oficina durante el horario de atencin regular y recoger una tarjeta de cupones de GoodRx.  - Si necesita que su receta se enve electrnicamente a una farmacia diferente, informe a nuestra oficina a travs de MyChart de [place] o por telfono llamando al [phone] y presione la opcin 4.  

## 2022-01-17 NOTE — Progress Notes (Signed)
Follow-Up Visit   Subjective  Danielle Sawyer is a 41 y.o. female who presents for the following: Annual Exam (6 month tbse , hx of bcc, rosacea, hx of dysplastic nevi ). The patient presents for Total-Body Skin Exam (TBSE) for skin cancer screening and mole check.  The patient has spots, moles and lesions to be evaluated, some may be new or changing and the patient has concerns that these could be cancer.  The following portions of the chart were reviewed this encounter and updated as appropriate:  Tobacco  Allergies  Meds  Problems  Med Hx  Surg Hx  Fam Hx     Review of Systems: No other skin or systemic complaints except as noted in HPI or Assessment and Plan.  Objective  Well appearing patient in no apparent distress; mood and affect are within normal limits.  A full examination was performed including scalp, head, eyes, ears, nose, lips, neck, chest, axillae, abdomen, back, buttocks, bilateral upper extremities, bilateral lower extremities, hands, feet, fingers, toes, fingernails, and toenails. All findings within normal limits unless otherwise noted below.  face A little pinkness at mid face and nose  spinal lower back 1.4 cm x 1 cm regular brown macule         Assessment & Plan  Rosacea face Rosacea is a chronic progressive skin condition usually affecting the face of adults, causing redness and/or acne bumps. It is treatable but not curable. It sometimes affects the eyes (ocular rosacea) as well. It may respond to topical and/or systemic medication and can flare with stress, sun exposure, alcohol, exercise and some foods.  Daily application of broad spectrum spf 30+ sunscreen to face is recommended to reduce flares.   Rx of skin medicinals , patient did not start. Patient not bothered by and defers treatment   Nevus spinal lower back See photo Benign-appearing.  Observation.  Call clinic for new or changing lesions.  Recommend daily use of broad spectrum spf  30+ sunscreen to sun-exposed areas.   Lentigines - Scattered tan macules - Due to sun exposure - Benign-appearing, observe - Recommend daily broad spectrum sunscreen SPF 30+ to sun-exposed areas, reapply every 2 hours as needed. - Call for any changes  Seborrheic Keratoses - Stuck-on, waxy, tan-brown papules and/or plaques  - Benign-appearing - Discussed benign etiology and prognosis. - Observe - Call for any changes  Melanocytic Nevi - Tan-brown and/or pink-flesh-colored symmetric macules and papules - Benign appearing on exam today - Observation - Call clinic for new or changing moles - Recommend daily use of broad spectrum spf 30+ sunscreen to sun-exposed areas.   Hemangiomas - Red papules - Discussed benign nature - Observe - Call for any changes  Actinic Damage - Chronic condition, secondary to cumulative UV/sun exposure - diffuse scaly erythematous macules with underlying dyspigmentation - Recommend daily broad spectrum sunscreen SPF 30+ to sun-exposed areas, reapply every 2 hours as needed.  - Staying in the shade or wearing long sleeves, sun glasses (UVA+UVB protection) and wide brim hats (4-inch brim around the entire circumference of the hat) are also recommended for sun protection.  - Call for new or changing lesions.  History of Basal Cell Carcinoma of the Skin at lt superior chest parasternal 2017 - No evidence of recurrence today - Recommend regular full body skin exams - Recommend daily broad spectrum sunscreen SPF 30+ to sun-exposed areas, reapply every 2 hours as needed.  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  - Call if any new or changing  lesions are noted between office visits  Skin cancer screening performed today.  Return in about 6 months (around 07/20/2022) for TBSE. IRuthell Rummage, CMA, am acting as scribe for Sarina Ser, MD. Documentation: I have reviewed the above documentation for accuracy and completeness, and I agree with the above.  Sarina Ser, MD

## 2022-04-18 ENCOUNTER — Other Ambulatory Visit: Payer: Self-pay | Admitting: Cardiovascular Disease

## 2022-05-23 NOTE — Progress Notes (Signed)
Cardiology Office Note  Date:  05/24/2022   ID:  JODEL SCIANDRA, DOB Jan 26, 1981, MRN [id number]  PCP:  Dorothey Baseman, MD   Chief Complaint  Patient presents with   Follow up palpitations    "Doing well." Medications reviewed by the patient verbally.     HPI:  Danielle Sawyer is a 42 year old woman with no prior cardiac history COVID early August 2022 with associated tachypalpitations started on metoprolol Who presents for follow-up of her chest pain, palpitations/tachycardia post COVID  Last seen by myself in clinic September 2022 In follow-up reports that she is doing well Has continued to take metoprolol tartrate 25 twice daily Recently by her Apple Watch, has appreciated resting heart rate 42s to 50 sometimes during the day Other time will have spikes in the day, heart rates low 100s  In general is asymptomatic, denies tachypalpitations  Has 2 young children age 38 and 48, active at baseline  Prior monitor reviewed, no atrial fibrillation noted  EKG personally reviewed by myself on todays visit Normal sinus rhythm rate 66 bpm no significant ST-T wave changes  Prior history discussed COVID 5 weeks ago Seen in clinic last week by primary care, has had palpitations, dull achy chest pain across her upper chest Gets worse with minimal exertion Bronchitis treated with ABX and prednisone  F/u CXR clear  Echocardiogram Normal LV function, normal RV function, no significant valve disease, overall normal study   30 day event monitor 8/23 Paroxysmal tachycardia, sinus tachycardia, PACs, PVCs    PMH:   has a past medical history of Basal cell carcinoma (05/12/2016), Dysplastic nevus (05/06/2013), Dysplastic nevus (11/26/2013), Dysplastic nevus (12/04/2014), Dysplastic nevus (06/08/2015), Dysplastic nevus (08/14/2017), Dysplastic nevus (07/10/2019), Dysplastic nevus (01/08/2020), Dysplastic nevus (12/23/2020), and Medical history non-contributory.  PSH:    Past Surgical  History:  Procedure Laterality Date   CESAREAN SECTION N/A 12/19/2012   Procedure: CESAREAN SECTION;  Surgeon: Adam Phenix, MD;  Location: [facility];  Service: Obstetrics;  Laterality: N/A;  primary   CESAREAN SECTION N/A 11/08/2015   Procedure: REPEAT CESAREAN SECTION;  Surgeon: Tilda Burrow, MD;  Location: [facility];  Service: Obstetrics;  Laterality: N/A;  Gilmer Mor to RNFA   DILATION AND EVACUATION N/A 11/06/2014   Procedure: DILATATION AND EVACUATION UNDER ULTRASOUND GUIDANCE;  Surgeon: Tereso Newcomer, MD;  Location: [facility];  Service: Gynecology;  Laterality: N/A;   WISDOM TOOTH EXTRACTION     x3    Current Outpatient Medications  Medication Sig Dispense Refill   metoprolol tartrate (LOPRESSOR) 25 MG tablet TAKE 1 TABLET BY MOUTH TWICE A DAY 180 tablet 0   Multiple Vitamin (MULTIVITAMIN) tablet Take 1 tablet by mouth daily. (Patient not taking: Reported on 10/20/2021)     mupirocin ointment (BACTROBAN) 2 % Apply 1 application topically daily. Qd to excision site (Patient not taking: Reported on 06/29/2020) 22 g 0   No current facility-administered medications for this visit.    Allergies:   Patient has no known allergies.   Social History:  The patient  reports that she has never smoked. She has never used smokeless tobacco. She reports current alcohol use of about 7.0 - 8.0 standard drinks of alcohol per week. She reports that she does not use drugs.   Family History:   family history includes Breast cancer in her maternal aunt, maternal grandmother, and paternal grandmother; Cancer in her maternal aunt, maternal grandfather, maternal grandmother, paternal aunt, paternal aunt, paternal grandfather, and paternal grandmother; Heart disease  in her father.    Review of Systems: Review of Systems  Constitutional: Negative.   HENT: Negative.    Respiratory: Negative.    Cardiovascular: Negative.   Gastrointestinal: Negative.   Musculoskeletal: Negative.   Neurological:  Negative.   Psychiatric/Behavioral: Negative.    All other systems reviewed and are negative.    PHYSICAL EXAM: VS:  BP 110/80 (BP Location: Left Arm, Patient Position: Sitting, Cuff Size: Normal)   Pulse 66   Ht 5\' 6"  (1.676 m)   Wt 164 lb (74.4 kg)   SpO2 98%   BMI 26.47 kg/m  , BMI Body mass index is 26.47 kg/m. Constitutional:  oriented to person, place, and time. No distress.  HENT:  Head: Grossly normal Eyes:  no discharge. No scleral icterus.  Neck: No JVD, no carotid bruits  Cardiovascular: Regular rate and rhythm, no murmurs appreciated Pulmonary/Chest: Clear to auscultation bilaterally, no wheezes or rails Abdominal: Soft.  no distension.  no tenderness.  Musculoskeletal: Normal range of motion Neurological:  normal muscle tone. Coordination normal. No atrophy Skin: Skin warm and dry Psychiatric: normal affect, pleasant  Recent Labs: 10/20/2021: ALT 15; BUN 14; Creatinine, Ser 0.78; Hemoglobin 13.4; Platelets 307; Potassium 3.7; Sodium 139; TSH 1.000    Lipid Panel Lab Results  Component Value Date   CHOL 205 (H) 10/20/2021   HDL 77 10/20/2021   LDLCALC 113 (H) 10/20/2021   TRIG 85 10/20/2021      Wt Readings from Last 3 Encounters:  05/24/22 164 lb (74.4 kg)  10/20/21 155 lb (70.3 kg)  01/27/21 142 lb (64.4 kg)     ASSESSMENT AND PLAN:  Problem List Items Addressed This Visit     Palpitations - Primary   Other Visit Diagnoses     Chest pain of uncertain etiology       COVID       Paroxysmal tachycardia (HCC)         Tachycardia/palpitations In the setting of post-COVID Has made good recovery, symptoms well-controlled on metoprolol tartrate 25 twice daily Discussed possibly weaning down on the metoprolol as tolerated May be able to even wean off the metoprolol and take this as needed She will do a slow wean at home and contact us for guidance if needed  COVID-19 infection Early August 2022, has made full recovery treated with antibiotics,  prednisone for suspected bronchitis Likely exacerbated arrhythmia as detailed above   Total encounter time more than 30 minutes  Greater than 50% was spent in counseling and coordination of care with the patient   Signed, Dossie Arbour, M.D., Ph.D. [facility] [place], [place] [phone]

## 2022-05-24 ENCOUNTER — Ambulatory Visit: Payer: BC Managed Care – PPO | Attending: Cardiovascular Disease | Admitting: Cardiovascular Disease

## 2022-05-24 ENCOUNTER — Encounter: Payer: Self-pay | Admitting: Cardiovascular Disease

## 2022-05-24 VITALS — BP 110/80 | HR 66 | Ht 66.0 in | Wt 164.0 lb

## 2022-05-24 DIAGNOSIS — U071 COVID-19: Secondary | ICD-10-CM | POA: Diagnosis not present

## 2022-05-24 DIAGNOSIS — R079 Chest pain, unspecified: Secondary | ICD-10-CM | POA: Diagnosis not present

## 2022-05-24 DIAGNOSIS — I479 Paroxysmal tachycardia, unspecified: Secondary | ICD-10-CM

## 2022-05-24 DIAGNOSIS — R002 Palpitations: Secondary | ICD-10-CM | POA: Diagnosis not present

## 2022-05-24 NOTE — Patient Instructions (Signed)
Medication Instructions:  No changes  If you need a refill on your cardiac medications before your next appointment, please call your pharmacy.   Lab work: No new labs needed  Testing/Procedures: No new testing needed  Follow-Up: At [facility], you and your health needs are our priority.  As part of our continuing mission to provide you with exceptional heart care, we have created designated Provider Care Teams.  These Care Teams include your primary Cardiologist (physician) and Advanced Practice Providers (APPs -  Physician Assistants and Nurse Practitioners) who all work together to provide you with the care you need, when you need it.  You will need a follow up appointment as needed  Providers on your designated Care Team:   Christopher Berge, NP Ryan Dunn, PA-C Cadence Furth, PA-C  COVID-19 Vaccine Information can be found at: [email or web address] For questions related to vaccine distribution or appointments, please email [email or web address] or call [phone].    

## 2022-07-16 ENCOUNTER — Other Ambulatory Visit: Payer: Self-pay | Admitting: Cardiovascular Disease

## 2022-08-01 ENCOUNTER — Encounter: Payer: Self-pay | Admitting: Dermatology

## 2022-08-01 ENCOUNTER — Ambulatory Visit: Payer: BC Managed Care – PPO | Admitting: Dermatology

## 2022-08-01 VITALS — BP 112/75 | HR 55

## 2022-08-01 DIAGNOSIS — L578 Other skin changes due to chronic exposure to nonionizing radiation: Secondary | ICD-10-CM

## 2022-08-01 DIAGNOSIS — Z85828 Personal history of other malignant neoplasm of skin: Secondary | ICD-10-CM

## 2022-08-01 DIAGNOSIS — Z1283 Encounter for screening for malignant neoplasm of skin: Secondary | ICD-10-CM | POA: Diagnosis not present

## 2022-08-01 DIAGNOSIS — D225 Melanocytic nevi of trunk: Secondary | ICD-10-CM

## 2022-08-01 DIAGNOSIS — D1801 Hemangioma of skin and subcutaneous tissue: Secondary | ICD-10-CM

## 2022-08-01 DIAGNOSIS — L821 Other seborrheic keratosis: Secondary | ICD-10-CM

## 2022-08-01 DIAGNOSIS — Z86018 Personal history of other benign neoplasm: Secondary | ICD-10-CM

## 2022-08-01 DIAGNOSIS — L814 Other melanin hyperpigmentation: Secondary | ICD-10-CM

## 2022-08-01 DIAGNOSIS — D229 Melanocytic nevi, unspecified: Secondary | ICD-10-CM

## 2022-08-01 NOTE — Patient Instructions (Signed)
Due to recent changes in healthcare laws, you may see results of your pathology and/or laboratory studies on MyChart before the doctors have had a chance to review them. We understand that in some cases there may be results that are confusing or concerning to you. Please understand that not all results are received at the same time and often the doctors may need to interpret multiple results in order to provide you with the best plan of care or course of treatment. Therefore, we ask that you please give us 2 business days to thoroughly review all your results before contacting the office for clarification. Should we see a critical lab result, you will be contacted sooner.   If You Need Anything After Your Visit  If you have any questions or concerns for your doctor, please call our main line at [phone] and press option 4 to reach your doctor's medical assistant. If no one answers, please leave a voicemail as directed and we will return your call as soon as possible. Messages left after 4 pm will be answered the following business day.   You may also send us a message via MyChart. We typically respond to MyChart messages within 1-2 business days.  For prescription refills, please ask your pharmacy to contact our office. Our fax number is [phone].  If you have an urgent issue when the clinic is closed that cannot wait until the next business day, you can page your doctor at the number below.    Please note that while we do our best to be available for urgent issues outside of office hours, we are not available 24/7.   If you have an urgent issue and are unable to reach us, you may choose to seek medical care at your doctor's office, retail clinic, urgent care center, or emergency room.  If you have a medical emergency, please immediately call 911 or go to the emergency department.  Pager Numbers  - Dr. Kowalski: [phone]  - Dr. Moye: [phone]  - Dr. Stewart:  [phone]  In the event of inclement weather, please call our main line at [phone] for an update on the status of any delays or closures.  Dermatology Medication Tips: Please keep the boxes that topical medications come in in order to help keep track of the instructions about where and how to use these. Pharmacies typically print the medication instructions only on the boxes and not directly on the medication tubes.   If your medication is too expensive, please contact our office at [phone] option 4 or send us a message through MyChart.   We are unable to tell what your co-pay for medications will be in advance as this is different depending on your insurance coverage. However, we may be able to find a substitute medication at lower cost or fill out paperwork to get insurance to cover a needed medication.   If a prior authorization is required to get your medication covered by your insurance company, please allow us 1-2 business days to complete this process.  Drug prices often vary depending on where the prescription is filled and some pharmacies may offer cheaper prices.  The website www.goodrx.com contains coupons for medications through different pharmacies. The prices here do not account for what the cost may be with help from insurance (it may be cheaper with your insurance), but the website can give you the price if you did not use any insurance.  - You can print the associated coupon and take it with   your prescription to the pharmacy.  - You may also stop by our office during regular business hours and pick up a GoodRx coupon card.  - If you need your prescription sent electronically to a different pharmacy, notify our office through [facility] MyChart or by phone at [phone] option 4.     Si Usted Necesita Algo Despus de Su Visita  Tambin puede enviarnos un mensaje a travs de MyChart. Por lo general respondemos a los mensajes de MyChart en el transcurso de 1 a 2  das hbiles.  Para renovar recetas, por favor pida a su farmacia que se ponga en contacto con nuestra oficina. Nuestro nmero de fax es el [phone].  Si tiene un asunto urgente cuando la clnica est cerrada y que no puede esperar hasta el siguiente da hbil, puede llamar/localizar a su doctor(a) al nmero que aparece a continuacin.   Por favor, tenga en cuenta que aunque hacemos todo lo posible para estar disponibles para asuntos urgentes fuera del horario de oficina, no estamos disponibles las 24 horas del da, los 7 das de la semana.   Si tiene un problema urgente y no puede comunicarse con nosotros, puede optar por buscar atencin mdica  en el consultorio de su doctor(a), en una clnica privada, en un centro de atencin urgente o en una sala de emergencias.  Si tiene una emergencia mdica, por favor llame inmediatamente al 911 o vaya a la sala de emergencias.  Nmeros de bper  - Dr. Kowalski: [phone]  - Dra. Moye: [phone]  - Dra. Stewart: [phone]  En caso de inclemencias del tiempo, por favor llame a nuestra lnea principal al [phone] para una actualizacin sobre el estado de cualquier retraso o cierre.  Consejos para la medicacin en dermatologa: Por favor, guarde las cajas en las que vienen los medicamentos de uso tpico para ayudarle a seguir las instrucciones sobre dnde y cmo usarlos. Las farmacias generalmente imprimen las instrucciones del medicamento slo en las cajas y no directamente en los tubos del medicamento.   Si su medicamento es muy caro, por favor, pngase en contacto con nuestra oficina llamando al [phone] y presione la opcin 4 o envenos un mensaje a travs de MyChart.   No podemos decirle cul ser su copago por los medicamentos por adelantado ya que esto es diferente dependiendo de la cobertura de su seguro. Sin embargo, es posible que podamos encontrar un medicamento sustituto a menor costo o llenar un formulario para que el  seguro cubra el medicamento que se considera necesario.   Si se requiere una autorizacin previa para que su compaa de seguros cubra su medicamento, por favor permtanos de 1 a 2 das hbiles para completar este proceso.  Los precios de los medicamentos varan con frecuencia dependiendo del lugar de dnde se surte la receta y alguna farmacias pueden ofrecer precios ms baratos.  El sitio web www.goodrx.com tiene cupones para medicamentos de diferentes farmacias. Los precios aqu no tienen en cuenta lo que podra costar con la ayuda del seguro (puede ser ms barato con su seguro), pero el sitio web puede darle el precio si no utiliz ningn seguro.  - Puede imprimir el cupn correspondiente y llevarlo con su receta a la farmacia.  - Tambin puede pasar por nuestra oficina durante el horario de atencin regular y recoger una tarjeta de cupones de GoodRx.  - Si necesita que su receta se enve electrnicamente a una farmacia diferente, informe a nuestra oficina a travs de MyChart de [place]   o por telfono llamando al [phone] y presione la opcin 4.  

## 2022-08-01 NOTE — Progress Notes (Signed)
Follow-Up Visit   Subjective  Danielle Sawyer is a 42 y.o. female who presents for the following: Annual Exam (Hx BCC, dysplastic nevi ). The patient presents for Total-Body Skin Exam (TBSE) for skin cancer screening and mole check.  The patient has spots, moles and lesions to be evaluated, some may be new or changing.  The following portions of the chart were reviewed this encounter and updated as appropriate:   Tobacco  Allergies  Meds  Problems  Med Hx  Surg Hx  Fam Hx     Review of Systems:  No other skin or systemic complaints except as noted in HPI or Assessment and Plan.  Objective  Well appearing patient in no apparent distress; mood and affect are within normal limits.  A full examination was performed including scalp, head, eyes, ears, nose, lips, neck, chest, axillae, abdomen, back, buttocks, bilateral upper extremities, bilateral lower extremities, hands, feet, fingers, toes, fingernails, and toenails. All findings within normal limits unless otherwise noted below.  Mid to upper back spinal approx 7.0 cm sup to bra 1.2 x 0.5 cm regular brown macule with faded edges.            Assessment & Plan  Nevus Mid to upper back spinal approx 7.0 cm sup to bra See photos Benign-appearing.  Observation.  Call clinic for new or changing moles.  Recommend daily use of broad spectrum spf 30+ sunscreen to sun-exposed areas.   Lentigines - Scattered tan macules - Due to sun exposure - Benign-appearing, observe - Recommend daily broad spectrum sunscreen SPF 30+ to sun-exposed areas, reapply every 2 hours as needed. - Call for any changes  Seborrheic Keratoses - Stuck-on, waxy, tan-brown papules and/or plaques  - Benign-appearing - Discussed benign etiology and prognosis. - Observe - Call for any changes  Melanocytic Nevi - Tan-brown and/or pink-flesh-colored symmetric macules and papules - Benign appearing on exam today - Observation - Call clinic for new  or changing moles - Recommend daily use of broad spectrum spf 30+ sunscreen to sun-exposed areas.   Hemangiomas - Red papules - Discussed benign nature - Observe - Call for any changes  Actinic Damage - Chronic condition, secondary to cumulative UV/sun exposure - diffuse scaly erythematous macules with underlying dyspigmentation - Recommend daily broad spectrum sunscreen SPF 30+ to sun-exposed areas, reapply every 2 hours as needed.  - Staying in the shade or wearing long sleeves, sun glasses (UVA+UVB protection) and wide brim hats (4-inch brim around the entire circumference of the hat) are also recommended for sun protection.  - Call for new or changing lesions.  History of Basal Cell Carcinoma of the Skin - No evidence of recurrence today - Recommend regular full body skin exams - Recommend daily broad spectrum sunscreen SPF 30+ to sun-exposed areas, reapply every 2 hours as needed.  - Call if any new or changing lesions are noted between office visits  History of Dysplastic Nevi - No evidence of recurrence today - Recommend regular full body skin exams - Recommend daily broad spectrum sunscreen SPF 30+ to sun-exposed areas, reapply every 2 hours as needed.  - Call if any new or changing lesions are noted between office visits  Skin cancer screening performed today.  Return in about 6 months (around 02/01/2023) for TBSE.  Luther Redo, CMA, am acting as scribe for Sarina Ser, MD . Documentation: I have reviewed the above documentation for accuracy and completeness, and I agree with the above.  Sarina Ser, MD

## 2022-10-26 ENCOUNTER — Ambulatory Visit: Payer: BC Managed Care – PPO | Admitting: Family Medicine

## 2022-11-02 ENCOUNTER — Other Ambulatory Visit: Payer: Self-pay | Admitting: Family Medicine

## 2022-11-02 DIAGNOSIS — Z1231 Encounter for screening mammogram for malignant neoplasm of breast: Secondary | ICD-10-CM

## 2022-11-11 ENCOUNTER — Ambulatory Visit
Admission: RE | Admit: 2022-11-11 | Discharge: 2022-11-11 | Disposition: A | Discharge status: Home / Self Care | Payer: BC Managed Care – PPO | Source: Ambulatory Visit | Attending: Family Medicine | Admitting: Family Medicine

## 2022-11-11 DIAGNOSIS — Z1231 Encounter for screening mammogram for malignant neoplasm of breast: Secondary | ICD-10-CM

## 2022-12-21 ENCOUNTER — Ambulatory Visit: Payer: BC Managed Care – PPO | Admitting: Family Medicine

## 2023-01-10 ENCOUNTER — Other Ambulatory Visit: Payer: Self-pay | Admitting: Cardiovascular Disease

## 2023-01-25 ENCOUNTER — Encounter: Payer: Self-pay | Admitting: Family Medicine

## 2023-01-25 ENCOUNTER — Other Ambulatory Visit (HOSPITAL_COMMUNITY)
Admission: RE | Admit: 2023-01-25 | Discharge: 2023-01-25 | Disposition: A | Discharge status: Home / Self Care | Payer: BC Managed Care – PPO | Source: Ambulatory Visit | Attending: Family Medicine | Admitting: Family Medicine

## 2023-01-25 ENCOUNTER — Ambulatory Visit (INDEPENDENT_AMBULATORY_CARE_PROVIDER_SITE_OTHER): Payer: BC Managed Care – PPO | Admitting: Family Medicine

## 2023-01-25 VITALS — BP 116/72 | HR 53 | Wt 160.0 lb

## 2023-01-25 DIAGNOSIS — Z124 Encounter for screening for malignant neoplasm of cervix: Secondary | ICD-10-CM

## 2023-01-25 DIAGNOSIS — Z01419 Encounter for gynecological examination (general) (routine) without abnormal findings: Secondary | ICD-10-CM | POA: Diagnosis not present

## 2023-01-25 DIAGNOSIS — Z1339 Encounter for screening examination for other mental health and behavioral disorders: Secondary | ICD-10-CM | POA: Diagnosis not present

## 2023-01-25 NOTE — Progress Notes (Addendum)
Subjective:     Danielle Sawyer is a 42 y.o. female and is here for a comprehensive physical exam. The patient reports no problems.  The following portions of the patient's history were reviewed and updated as appropriate: allergies, current medications, past family history, past medical history, past social history, past surgical history, and problem list.  Review of Systems Pertinent items noted in HPI and remainder of comprehensive ROS otherwise negative.   Objective:  Chaperone present for exam   BP 116/72   Pulse (!) 53   Wt 160 lb (72.6 kg)   LMP 01/16/2023   BMI 25.82 kg/m  General appearance: alert, cooperative, and appears stated age Head: Normocephalic, without obvious abnormality, atraumatic Neck: no adenopathy, supple, symmetrical, trachea midline, and thyroid not enlarged, symmetric, no tenderness/mass/nodules Lungs: clear to auscultation bilaterally Breasts: normal appearance, no masses or tenderness Heart: regular rate and rhythm, S1, S2 normal, no murmur, click, rub or gallop Abdomen: soft, non-tender; bowel sounds normal; no masses,  no organomegaly Pelvic: cervix normal in appearance, external genitalia normal, no adnexal masses or tenderness, no cervical motion tenderness, uterus normal size, shape, and consistency, and vagina normal without discharge Extremities: extremities normal, atraumatic, no cyanosis or edema Pulses: 2+ and symmetric Skin: Skin color, texture, turgor normal. No rashes or lesions Lymph nodes: Cervical, supraclavicular, and axillary nodes normal. Neurologic: Grossly normal    Assessment:    Healthy female exam.      Plan:  Encounter for gynecological examination without abnormal finding - Plan: CBC, TSH, Comprehensive metabolic panel, Hemoglobin A1c, Lipid panel  Screening for malignant neoplasm of cervix - Plan: Cytology - PAP( [facility])  Return in 1 year (on 01/25/2024).    See After Visit Summary for Counseling Recommendations

## 2023-01-25 NOTE — Progress Notes (Signed)
Patient presents for Annual.  LMP: 01/16/23 Last pap:  06/10/2019 WNL  Contraception: None Mammogram: Up to date: 11/11/2022 STD Screening: Declines Flu Vaccine : Accepts  CC: Annual/None  Fun Fact: Hang out with kids.

## 2023-01-25 NOTE — Patient Instructions (Signed)

## 2023-01-26 LAB — COMPREHENSIVE METABOLIC PANEL
ALT: 17 IU/L (ref 0–32)
AST: 16 IU/L (ref 0–40)
Albumin: 4.4 g/dL (ref 3.9–4.9)
Alkaline Phosphatase: 75 IU/L (ref 44–121)
BUN/Creatinine Ratio: 13 (ref 9–23)
BUN: 10 mg/dL (ref 6–24)
Bilirubin Total: 0.6 mg/dL (ref 0.0–1.2)
CO2: 22 mmol/L (ref 20–29)
Calcium: 9.1 mg/dL (ref 8.7–10.2)
Chloride: 101 mmol/L (ref 96–106)
Creatinine, Ser: 0.77 mg/dL (ref 0.57–1.00)
Globulin, Total: 2.2 g/dL (ref 1.5–4.5)
Glucose: 92 mg/dL (ref 70–99)
Potassium: 4 mmol/L (ref 3.5–5.2)
Sodium: 138 mmol/L (ref 134–144)
Total Protein: 6.6 g/dL (ref 6.0–8.5)
eGFR: 99 mL/min/{1.73_m2} (ref >59)

## 2023-01-26 LAB — HEMOGLOBIN A1C
Est. average glucose Bld gHb Est-mCnc: 97 mg/dL
Hgb A1c MFr Bld: 5 % (ref 4.8–5.6)

## 2023-01-26 LAB — TSH: TSH: 1.22 u[IU]/mL (ref 0.450–4.500)

## 2023-01-26 LAB — CBC
Hematocrit: 39.9 % (ref 34.0–46.6)
Hemoglobin: 12.8 g/dL (ref 11.1–15.9)
MCH: 31.3 pg (ref 26.6–33.0)
MCHC: 32.1 g/dL (ref 31.5–35.7)
MCV: 98 fL — ABNORMAL HIGH (ref 79–97)
Platelets: 336 10*3/uL (ref 150–450)
RBC: 4.09 x10E6/uL (ref 3.77–5.28)
RDW: 11.7 % (ref 11.7–15.4)
WBC: 8.1 10*3/uL (ref 3.4–10.8)

## 2023-01-26 LAB — LIPID PANEL
Chol/HDL Ratio: 2.9 ratio (ref 0.0–4.4)
Cholesterol, Total: 205 mg/dL — ABNORMAL HIGH (ref 100–199)
HDL: 71 mg/dL (ref >39)
LDL Chol Calc (NIH): 117 mg/dL — ABNORMAL HIGH (ref 0–99)
Triglycerides: 99 mg/dL (ref 0–149)
VLDL Cholesterol Cal: 17 mg/dL (ref 5–40)

## 2023-01-30 LAB — CYTOLOGY - PAP
Comment: NEGATIVE
Diagnosis: NEGATIVE
High risk HPV: NEGATIVE

## 2023-02-23 ENCOUNTER — Ambulatory Visit: Payer: BC Managed Care – PPO | Admitting: Dermatology

## 2023-02-23 ENCOUNTER — Encounter: Payer: Self-pay | Admitting: Dermatology

## 2023-02-23 DIAGNOSIS — L814 Other melanin hyperpigmentation: Secondary | ICD-10-CM

## 2023-02-23 DIAGNOSIS — D1801 Hemangioma of skin and subcutaneous tissue: Secondary | ICD-10-CM

## 2023-02-23 DIAGNOSIS — D229 Melanocytic nevi, unspecified: Secondary | ICD-10-CM

## 2023-02-23 DIAGNOSIS — D225 Melanocytic nevi of trunk: Secondary | ICD-10-CM

## 2023-02-23 DIAGNOSIS — D492 Neoplasm of unspecified behavior of bone, soft tissue, and skin: Secondary | ICD-10-CM

## 2023-02-23 DIAGNOSIS — L578 Other skin changes due to chronic exposure to nonionizing radiation: Secondary | ICD-10-CM | POA: Diagnosis not present

## 2023-02-23 DIAGNOSIS — L821 Other seborrheic keratosis: Secondary | ICD-10-CM

## 2023-02-23 DIAGNOSIS — Z86018 Personal history of other benign neoplasm: Secondary | ICD-10-CM

## 2023-02-23 DIAGNOSIS — Z1283 Encounter for screening for malignant neoplasm of skin: Secondary | ICD-10-CM

## 2023-02-23 DIAGNOSIS — W908XXA Exposure to other nonionizing radiation, initial encounter: Secondary | ICD-10-CM

## 2023-02-23 DIAGNOSIS — Z85828 Personal history of other malignant neoplasm of skin: Secondary | ICD-10-CM

## 2023-02-23 NOTE — Progress Notes (Signed)
Follow-Up Visit   Subjective  Danielle Sawyer is a 42 y.o. female who presents for the following: 6 months Skin Cancer Screening and Full Body Skin Exam, hx of BCC, hx of Dysplastic nevus   The patient presents for Total-Body Skin Exam (TBSE) for skin cancer screening and mole check. The patient has spots, moles and lesions to be evaluated, some may be new or changing and the patient may have concern these could be cancer.    The following portions of the chart were reviewed this encounter and updated as appropriate: medications, allergies, medical history  Review of Systems:  No other skin or systemic complaints except as noted in HPI or Assessment and Plan.  Objective  Well appearing patient in no apparent distress; mood and affect are within normal limits.  A full examination was performed including scalp, head, eyes, ears, nose, lips, neck, chest, axillae, abdomen, back, buttocks, bilateral upper extremities, bilateral lower extremities, hands, feet, fingers, toes, fingernails, and toenails. All findings within normal limits unless otherwise noted below.   Relevant physical exam findings are noted in the Assessment and Plan.  right back paraspinal posterior waistline 0.8 cm irregular brown macule            Assessment & Plan   SKIN CANCER SCREENING PERFORMED TODAY.  ACTINIC DAMAGE - Chronic condition, secondary to cumulative UV/sun exposure - diffuse scaly erythematous macules with underlying dyspigmentation - Recommend daily broad spectrum sunscreen SPF 30+ to sun-exposed areas, reapply every 2 hours as needed.  - Staying in the shade or wearing long sleeves, sun glasses (UVA+UVB protection) and wide brim hats (4-inch brim around the entire circumference of the hat) are also recommended for sun protection.  - Call for new or changing lesions.  LENTIGINES, SEBORRHEIC KERATOSES, HEMANGIOMAS - Benign normal skin lesions - Benign-appearing - Call for any  changes  MELANOCYTIC NEVI - Tan-brown and/or pink-flesh-colored symmetric macules and papules - Benign appearing on exam today - Observation - Call clinic for new or changing moles - Recommend daily use of broad spectrum spf 30+ sunscreen to sun-exposed areas.   HISTORY OF DYSPLASTIC NEVUS No evidence of recurrence today Recommend regular full body skin exams Recommend daily broad spectrum sunscreen SPF 30+ to sun-exposed areas, reapply every 2 hours as needed.  Call if any new or changing lesions are noted between office visits   HISTORY OF BASAL CELL CARCINOMA OF THE SKIN - No evidence of recurrence today - Recommend regular full body skin exams - Recommend daily broad spectrum sunscreen SPF 30+ to sun-exposed areas, reapply every 2 hours as needed.  - Call if any new or changing lesions are noted between office visits   Neoplasm of skin right back paraspinal posterior waistline  Epidermal / dermal shaving  Lesion diameter (cm):  0.8 Informed consent: discussed and consent obtained   Timeout: patient name, date of birth, surgical site, and procedure verified   Procedure prep:  Patient was prepped and draped in usual sterile fashion Prep type:  Isopropyl alcohol Anesthesia: the lesion was anesthetized in a standard fashion   Anesthetic:  1% lidocaine w/ epinephrine 1-100,000 buffered w/ 8.4% NaHCO3 Hemostasis achieved with: pressure, aluminum chloride and electrodesiccation   Outcome: patient tolerated procedure well   Post-procedure details: sterile dressing applied and wound care instructions given   Dressing type: bandage and petrolatum    Specimen 1 - Surgical pathology Differential Diagnosis: R/O Dysplastic nevus   Check Margins: No  Related Medications mupirocin ointment (BACTROBAN) 2 %  Apply 1 application topically daily. Qd to excision site   Return in about 6 months (around 08/24/2023) for TBSE, hx fo Dyspaslt.  IAngelique Holm, CMA, am acting as scribe for  Armida Sans, MD .   Documentation: I have reviewed the above documentation for accuracy and completeness, and I agree with the above.  Armida Sans, MD

## 2023-02-23 NOTE — Patient Instructions (Addendum)

## 2023-02-28 ENCOUNTER — Telehealth: Payer: Self-pay

## 2023-02-28 LAB — SURGICAL PATHOLOGY

## 2023-02-28 NOTE — Telephone Encounter (Signed)
Left pt msg to call for bx results/sh 

## 2023-02-28 NOTE — Telephone Encounter (Signed)
-----   Message from Armida Sans sent at 02/28/2023  5:28 PM EDT ----- FINAL DIAGNOSIS        1. Skin, right back paraspinal posterior waistline :       DYSPLASTIC COMPOUND NEVUS WITH MODERATE ATYPIA, PERIPHERAL AND DEEP MARGINS       INVOLVED   Moderate dysplastic Recheck next visit

## 2023-03-02 ENCOUNTER — Telehealth: Payer: Self-pay

## 2023-03-02 NOTE — Telephone Encounter (Signed)
Advised pt of bx results/sh ?

## 2023-03-02 NOTE — Telephone Encounter (Signed)
-----   Message from Armida Sans sent at 02/28/2023  5:28 PM EDT ----- FINAL DIAGNOSIS        1. Skin, right back paraspinal posterior waistline :       DYSPLASTIC COMPOUND NEVUS WITH MODERATE ATYPIA, PERIPHERAL AND DEEP MARGINS       INVOLVED   Moderate dysplastic Recheck next visit

## 2023-10-03 ENCOUNTER — Telehealth: Payer: Self-pay | Admitting: Cardiovascular Disease

## 2023-10-03 ENCOUNTER — Other Ambulatory Visit: Payer: Self-pay | Admitting: Cardiovascular Disease

## 2023-10-03 NOTE — Telephone Encounter (Signed)
 Left voice mail

## 2023-10-03 NOTE — Telephone Encounter (Signed)
-----   Message from [facility] Genevia Kern C sent at 10/03/2023  8:23 AM EDT ----- Please contact the patient for a follow up with Dr. Gollan in order to keep getting refills. The AVS stated follow up as needed but in order for us  to keep refilling the Metoprolol , she will need to schedule a follow up with Dr. Gollan. She can contact her pcp if she would rather not make an appointment.  Thanks, Genevia Kern

## 2023-10-04 ENCOUNTER — Ambulatory Visit: Payer: BC Managed Care – PPO | Admitting: Dermatology

## 2023-10-10 NOTE — Telephone Encounter (Signed)
 Left voice mail

## 2023-10-13 NOTE — Telephone Encounter (Signed)
Pt is scheduled on 7/21

## 2023-10-23 ENCOUNTER — Other Ambulatory Visit: Payer: Self-pay | Admitting: Family Medicine

## 2023-10-23 DIAGNOSIS — Z1231 Encounter for screening mammogram for malignant neoplasm of breast: Secondary | ICD-10-CM

## 2023-11-06 ENCOUNTER — Ambulatory Visit: Admitting: Dermatology

## 2023-11-06 ENCOUNTER — Encounter: Payer: Self-pay | Admitting: Dermatology

## 2023-11-06 DIAGNOSIS — W908XXA Exposure to other nonionizing radiation, initial encounter: Secondary | ICD-10-CM | POA: Diagnosis not present

## 2023-11-06 DIAGNOSIS — L578 Other skin changes due to chronic exposure to nonionizing radiation: Secondary | ICD-10-CM

## 2023-11-06 DIAGNOSIS — Z1283 Encounter for screening for malignant neoplasm of skin: Secondary | ICD-10-CM | POA: Diagnosis not present

## 2023-11-06 DIAGNOSIS — D489 Neoplasm of uncertain behavior, unspecified: Secondary | ICD-10-CM

## 2023-11-06 DIAGNOSIS — Z86018 Personal history of other benign neoplasm: Secondary | ICD-10-CM

## 2023-11-06 DIAGNOSIS — D229 Melanocytic nevi, unspecified: Secondary | ICD-10-CM

## 2023-11-06 DIAGNOSIS — L814 Other melanin hyperpigmentation: Secondary | ICD-10-CM | POA: Diagnosis not present

## 2023-11-06 DIAGNOSIS — Z85828 Personal history of other malignant neoplasm of skin: Secondary | ICD-10-CM

## 2023-11-06 DIAGNOSIS — D225 Melanocytic nevi of trunk: Secondary | ICD-10-CM

## 2023-11-06 DIAGNOSIS — L821 Other seborrheic keratosis: Secondary | ICD-10-CM

## 2023-11-06 NOTE — Progress Notes (Signed)
 Follow-Up Visit   Subjective  Danielle Sawyer is a 43 y.o. female who presents for the following: Skin Cancer Screening and Full Body Skin Exam 6 month tbse  Hx of bcc, hx of dysplastic nevi,   The patient presents for Total-Body Skin Exam (TBSE) for skin cancer screening and mole check. The patient has spots, moles and lesions to be evaluated, some may be new or changing and the patient may have concern these could be cancer.  The following portions of the chart were reviewed this encounter and updated as appropriate: medications, allergies, medical history  Review of Systems:  No other skin or systemic complaints except as noted in HPI or Assessment and Plan.  Objective  Well appearing patient in no apparent distress; mood and affect are within normal limits.  A full examination was performed including scalp, head, eyes, ears, nose, lips, neck, chest, axillae, abdomen, back, buttocks, bilateral upper extremities, bilateral lower extremities, hands, feet, fingers, toes, fingernails, and toenails. All findings within normal limits unless otherwise noted below.   Relevant physical exam findings are noted in the Assessment and Plan.  right back paraspinal posterior waistline 0.6 cm repigmented brown macule    Assessment & Plan   SKIN CANCER SCREENING PERFORMED TODAY.  ACTINIC DAMAGE - Chronic condition, secondary to cumulative UV/sun exposure - diffuse scaly erythematous macules with underlying dyspigmentation - Recommend daily broad spectrum sunscreen SPF 30+ to sun-exposed areas, reapply every 2 hours as needed.  - Staying in the shade or wearing long sleeves, sun glasses (UVA+UVB protection) and wide brim hats (4-inch brim around the entire circumference of the hat) are also recommended for sun protection.  - Call for new or changing lesions.  LENTIGINES, SEBORRHEIC KERATOSES, HEMANGIOMAS - Benign normal skin lesions - Benign-appearing - Call for any changes  MELANOCYTIC  NEVI Spinal lower back see photo 1.4 cm x 1 cm regular brown macule  - Tan-brown and/or pink-flesh-colored symmetric macules and papules Benign-appearing. Stable compared to previous visit. Observation.  Call clinic for new or changing moles.  Recommend daily use of broad spectrum spf 30+ sunscreen to sun-exposed areas.   - Benign appearing on exam today - Observation - Call clinic for new or changing moles - Recommend daily use of broad spectrum spf 30+ sunscreen to sun-exposed areas.   HISTORY OF DYSPLASTIC NEVUS Other locations see history  No evidence of recurrence today Recommend regular full body skin exams Recommend daily broad spectrum sunscreen SPF 30+ to sun-exposed areas, reapply every 2 hours as needed.  Call if any new or changing lesions are noted between office visits  HISTORY OF BASAL CELL CARCINOMA OF THE SKIN 05/12/2016 -  left superior chest parasternal 3.0 cm inferior to medial clavicle - superficial and nodular patterns - No evidence of recurrence today - Recommend regular full body skin exams - Recommend daily broad spectrum sunscreen SPF 30+ to sun-exposed areas, reapply every 2 hours as needed.  - Call if any new or changing lesions are noted between office visits  NEOPLASM OF UNCERTAIN BEHAVIOR right back paraspinal posterior waistline Epidermal / dermal shaving  Lesion diameter (cm):  0.6 Informed consent: discussed and consent obtained   Timeout: patient name, date of birth, surgical site, and procedure verified   Procedure prep:  Patient was prepped and draped in usual sterile fashion Prep type:  Isopropyl alcohol Anesthesia: the lesion was anesthetized in a standard fashion   Anesthetic:  1% lidocaine  w/ epinephrine 1-100,000 buffered w/ 8.4% NaHCO3 Instrument used: flexible  razor blade   Hemostasis achieved with: pressure, aluminum chloride and electrodesiccation   Outcome: patient tolerated procedure well   Post-procedure details: sterile dressing  applied and wound care instructions given   Dressing type: bandage and petrolatum   Recurrent  See previous pathology 02/23/2023  Accession: [id number] Bx proven moderate atypia at right back paraspinal posterior waistline   Do not send pathology  Return in about 6 months (around 05/07/2024) for TBSE.  IRandee Busing, CMA, am acting as scribe for Celine Collard, MD.   Documentation: I have reviewed the above documentation for accuracy and completeness, and I agree with the above.  Celine Collard, MD

## 2023-11-06 NOTE — Patient Instructions (Addendum)

## 2023-11-14 ENCOUNTER — Ambulatory Visit
Admission: RE | Admit: 2023-11-14 | Discharge: 2023-11-14 | Disposition: A | Discharge status: Home / Self Care | Payer: Self-pay | Source: Ambulatory Visit | Attending: Family Medicine | Admitting: Family Medicine

## 2023-11-14 DIAGNOSIS — Z1231 Encounter for screening mammogram for malignant neoplasm of breast: Secondary | ICD-10-CM | POA: Diagnosis present

## 2023-11-16 ENCOUNTER — Other Ambulatory Visit: Payer: Self-pay | Admitting: Family Medicine

## 2023-11-16 DIAGNOSIS — R928 Other abnormal and inconclusive findings on diagnostic imaging of breast: Secondary | ICD-10-CM

## 2023-11-27 ENCOUNTER — Ambulatory Visit
Admission: RE | Admit: 2023-11-27 | Discharge: 2023-11-27 | Disposition: A | Discharge status: Home / Self Care | Source: Ambulatory Visit | Attending: Family Medicine | Admitting: Family Medicine

## 2023-11-27 DIAGNOSIS — R928 Other abnormal and inconclusive findings on diagnostic imaging of breast: Secondary | ICD-10-CM | POA: Insufficient documentation

## 2023-12-09 DIAGNOSIS — R000 Tachycardia, unspecified: Secondary | ICD-10-CM | POA: Insufficient documentation

## 2023-12-09 NOTE — Progress Notes (Unsigned)
 Cardiology Office Note  Date:  12/11/2023   ID:  AYANNI TUN, DOB 1980-10-30, MRN [id number]  PCP:  Glover Lenis, MD   Chief Complaint  Patient presents with   Follow-up    OD 12 month f/u no complaints today. Meds reviewed verbally with pt.    HPI:  [name] is a 43 year old woman with no prior cardiac history COVID early August 2022 with associated tachypalpitations started on metoprolol  Who presents for follow-up of her chest pain, palpitations/tachycardia post COVID  Last seen by myself in clinic 1/24 Still on metoprolol  tartrate 25 BID Has been afraid to come off the medication  Continues to have rare palpitations, sometimes fast heart rate, irregular Uses Apple Watch to track rhythm Telemetry strips reviewed on her phone showing PACs, atrial tachycardia, sinus arrhythmia  Has 2 young children age 44 and 16, active at baseline  Prior monitor reviewed, no atrial fibrillation noted  EKG personally reviewed by myself on todays visit EKG Interpretation Date/Time:  Monday December 11 2023 16:18:57 EDT Ventricular Rate:  58 PR Interval:  108 QRS Duration:  76 QT Interval:  408 QTC Calculation: 400 R Axis:   -6  Text Interpretation: Sinus bradycardia with short PR Low voltage QRS No previous ECGs available Confirmed by Perla Lye [phone]) on 12/11/2023 4:36:38 PM    Prior history discussed COVID 5 weeks ago Seen in clinic last week by primary care, has had palpitations, dull achy chest pain across her upper chest Gets worse with minimal exertion Bronchitis treated with ABX and prednisone  F/u CXR clear  Echocardiogram Normal LV function, normal RV function, no significant valve disease, overall normal study   30 day event monitor 8/23 Paroxysmal tachycardia, sinus tachycardia, PACs, PVCs   PMH:   has a past medical history of Basal cell carcinoma (05/12/2016), Dysplastic nevus (05/06/2013), Dysplastic nevus (11/26/2013), Dysplastic nevus  (12/04/2014), Dysplastic nevus (06/08/2015), Dysplastic nevus (08/14/2017), Dysplastic nevus (07/10/2019), Dysplastic nevus (01/08/2020), Dysplastic nevus (12/23/2020), Dysplastic nevus (02/23/2023), and Medical history non-contributory.  PSH:    Past Surgical History:  Procedure Laterality Date   CESAREAN SECTION N/A 12/19/2012   Procedure: CESAREAN SECTION;  Surgeon: Lynwood KANDICE Solomons, MD;  Location: [facility];  Service: Obstetrics;  Laterality: N/A;  primary   CESAREAN SECTION N/A 11/08/2015   Procedure: REPEAT CESAREAN SECTION;  Surgeon: Norleen LULLA Server, MD;  Location: [facility];  Service: Obstetrics;  Laterality: N/A;  Damien Single to RNFA   DILATION AND EVACUATION N/A 11/06/2014   Procedure: DILATATION AND EVACUATION UNDER ULTRASOUND GUIDANCE;  Surgeon: Gloris DELENA Hugger, MD;  Location: [facility];  Service: Gynecology;  Laterality: N/A;   WISDOM TOOTH EXTRACTION     x3    Current Outpatient Medications  Medication Sig Dispense Refill   metoprolol  tartrate (LOPRESSOR ) 25 MG tablet TAKE 1 TABLET BY MOUTH TWICE A DAY 180 tablet 0   No current facility-administered medications for this visit.    Allergies:   Patient has no known allergies.   Social History:  The patient  reports that she has never smoked. She has never used smokeless tobacco. She reports current alcohol use of about 7.0 - 8.0 standard drinks of alcohol per week. She reports that she does not use drugs.   Family History:   family history includes Breast cancer in her maternal aunt, maternal grandmother, and paternal grandmother; Cancer in her maternal aunt, maternal grandfather, maternal grandmother, paternal aunt, paternal aunt, paternal grandfather, and paternal grandmother; Heart disease in her father.  Review of Systems: Review of Systems  Constitutional: Negative.   HENT: Negative.    Respiratory: Negative.    Cardiovascular: Negative.   Gastrointestinal: Negative.   Musculoskeletal: Negative.   Neurological:  Negative.   Psychiatric/Behavioral: Negative.    All other systems reviewed and are negative.    PHYSICAL EXAM: VS:  BP 110/60 (BP Location: Left Arm, Patient Position: Sitting, Cuff Size: Normal)   Pulse (!) 58   Ht 5' 6 (1.676 m)   Wt 145 lb (65.8 kg)   LMP 11/09/2023   SpO2 99%   BMI 23.40 kg/m  , BMI Body mass index is 23.4 kg/m. Constitutional:  oriented to person, place, and time. No distress.  HENT:  Head: Grossly normal Eyes:  no discharge. No scleral icterus.  Neck: No JVD, no carotid bruits  Cardiovascular: Regular rate and rhythm, no murmurs appreciated Pulmonary/Chest: Clear to auscultation bilaterally, no wheezes or rails Abdominal: Soft.  no distension.  no tenderness.  Musculoskeletal: Normal range of motion Neurological:  normal muscle tone. Coordination normal. No atrophy Skin: Skin warm and dry Psychiatric: normal affect, pleasant  Recent Labs: 01/25/2023: ALT 17; BUN 10; Creatinine, Ser 0.77; Hemoglobin 12.8; Platelets 336; Potassium 4.0; Sodium 138; TSH 1.220    Lipid Panel Lab Results  Component Value Date   CHOL 205 (H) 01/25/2023   HDL 71 01/25/2023   LDLCALC 117 (H) 01/25/2023   TRIG 99 01/25/2023    Wt Readings from Last 3 Encounters:  12/11/23 145 lb (65.8 kg)  01/25/23 160 lb (72.6 kg)  05/24/22 164 lb (74.4 kg)     ASSESSMENT AND PLAN:  Problem List Items Addressed This Visit     Sinus tachycardia   Relevant Orders   EKG 12-Lead (Completed)   Palpitations - Primary   Relevant Orders   EKG 12-Lead (Completed)   Tachycardia/palpitations Doing well on metoprolol  25 twice daily, rare breakthrough palpitation symptoms If symptoms get worse could try higher dose of metoprolol  Would prefer not to add antiarrhythmic patient  COVID-19 infection Early August 2022, has made full recovery    Signed, Velinda Lunger, M.D., Ph.D. [facility] [place], [place] [phone]

## 2023-12-11 ENCOUNTER — Ambulatory Visit: Payer: Self-pay | Attending: Cardiovascular Disease | Admitting: Cardiovascular Disease

## 2023-12-11 ENCOUNTER — Encounter: Payer: Self-pay | Admitting: Cardiovascular Disease

## 2023-12-11 VITALS — BP 110/60 | HR 58 | Ht 66.0 in | Wt 145.0 lb

## 2023-12-11 DIAGNOSIS — R002 Palpitations: Secondary | ICD-10-CM

## 2023-12-11 DIAGNOSIS — R000 Tachycardia, unspecified: Secondary | ICD-10-CM | POA: Diagnosis not present

## 2023-12-11 MED ORDER — METOPROLOL TARTRATE 25 MG PO TABS: 25.0000 mg | ORAL_TABLET | Freq: Two times a day (BID) | ORAL | 4 refills | Status: AC

## 2023-12-11 NOTE — Patient Instructions (Addendum)

## 2024-01-30 ENCOUNTER — Ambulatory Visit: Admitting: Family Medicine

## 2024-01-30 VITALS — BP 148/79 | HR 54 | Ht 66.0 in | Wt 147.4 lb

## 2024-01-30 DIAGNOSIS — Z01419 Encounter for gynecological examination (general) (routine) without abnormal findings: Secondary | ICD-10-CM

## 2024-01-30 DIAGNOSIS — Z23 Encounter for immunization: Secondary | ICD-10-CM | POA: Diagnosis not present

## 2024-01-30 NOTE — Progress Notes (Signed)
 Subjective:     Danielle Sawyer is a 43 y.o. female and is here for a comprehensive physical exam. The patient reports no problems.      The following portions of the patient's history were reviewed and updated as appropriate: allergies, current medications, past family history, past medical history, past social history, past surgical history, and problem list.  Review of Systems Pertinent items noted in HPI and remainder of comprehensive ROS otherwise negative.   Objective:  Chaperone present for exam   BP (!) 148/79 (BP Location: Left Arm, Patient Position: Sitting, Cuff Size: Normal)   Pulse (!) 54   Ht 5' 6 (1.676 m)   Wt 147 lb 6.4 oz (66.9 kg)   LMP 01/16/2024 (Exact Date)   BMI 23.79 kg/m  General appearance: alert, cooperative, and appears stated age Head: Normocephalic, without obvious abnormality, atraumatic Neck: no adenopathy, supple, symmetrical, trachea midline, and thyroid not enlarged, symmetric, no tenderness/mass/nodules Lungs: clear to auscultation bilaterally Breasts: normal appearance, no masses or tenderness Heart: regular rate and rhythm, S1, S2 normal, no murmur, click, rub or gallop Abdomen: soft, non-tender; bowel sounds normal; no masses,  no organomegaly Extremities: extremities normal, atraumatic, no cyanosis or edema Pulses: 2+ and symmetric Skin: Skin color, texture, turgor normal. No rashes or lesions Lymph nodes: Cervical, supraclavicular, and axillary nodes normal. Neurologic: Grossly normal    Assessment:    Healthy female exam.      Plan:  Well woman exam with routine gynecological exam - Annual labs. Mammogram is UTD. Pap last year WNL. - Plan: CBC, TSH, Hemoglobin A1c, Comprehensive metabolic panel with GFR, Lipid panel  Encounter for gynecological examination without abnormal finding - Plan: Flu vaccine trivalent PF, 6mos and older(Flulaval,Afluria,Fluarix,Fluzone)    See After Visit Summary for Counseling Recommendations

## 2024-01-30 NOTE — Patient Instructions (Signed)

## 2024-01-31 ENCOUNTER — Ambulatory Visit: Payer: Self-pay | Admitting: Family Medicine

## 2024-01-31 LAB — COMPREHENSIVE METABOLIC PANEL WITH GFR
ALT: 13 IU/L (ref 0–32)
AST: 14 IU/L (ref 0–40)
Albumin: 4.5 g/dL (ref 3.9–4.9)
Alkaline Phosphatase: 73 IU/L (ref 44–121)
BUN/Creatinine Ratio: 16 (ref 9–23)
BUN: 14 mg/dL (ref 6–24)
Bilirubin Total: 0.7 mg/dL (ref 0.0–1.2)
CO2: 23 mmol/L (ref 20–29)
Calcium: 9.5 mg/dL (ref 8.7–10.2)
Chloride: 102 mmol/L (ref 96–106)
Creatinine, Ser: 0.85 mg/dL (ref 0.57–1.00)
Globulin, Total: 2.4 g/dL (ref 1.5–4.5)
Glucose: 93 mg/dL (ref 70–99)
Potassium: 4.5 mmol/L (ref 3.5–5.2)
Sodium: 139 mmol/L (ref 134–144)
Total Protein: 6.9 g/dL (ref 6.0–8.5)
eGFR: 87 mL/min/1.73 (ref >59)

## 2024-01-31 LAB — CBC
Hematocrit: 41.5 % (ref 34.0–46.6)
Hemoglobin: 13.7 g/dL (ref 11.1–15.9)
MCH: 32.5 pg (ref 26.6–33.0)
MCHC: 33 g/dL (ref 31.5–35.7)
MCV: 98 fL — ABNORMAL HIGH (ref 79–97)
Platelets: 347 x10E3/uL (ref 150–450)
RBC: 4.22 x10E6/uL (ref 3.77–5.28)
RDW: 11.5 % — ABNORMAL LOW (ref 11.7–15.4)
WBC: 6.5 x10E3/uL (ref 3.4–10.8)

## 2024-01-31 LAB — LIPID PANEL
Chol/HDL Ratio: 2.7 ratio (ref 0.0–4.4)
Cholesterol, Total: 195 mg/dL (ref 100–199)
HDL: 73 mg/dL (ref >39)
LDL Chol Calc (NIH): 105 mg/dL — ABNORMAL HIGH (ref 0–99)
Triglycerides: 96 mg/dL (ref 0–149)
VLDL Cholesterol Cal: 17 mg/dL (ref 5–40)

## 2024-01-31 LAB — HEMOGLOBIN A1C
Est. average glucose Bld gHb Est-mCnc: 91 mg/dL
Hgb A1c MFr Bld: 4.8 % (ref 4.8–5.6)

## 2024-01-31 LAB — TSH: TSH: 0.877 u[IU]/mL (ref 0.450–4.500)

## 2024-05-06 ENCOUNTER — Encounter: Payer: Self-pay | Admitting: Dermatology

## 2024-05-06 ENCOUNTER — Ambulatory Visit: Admitting: Dermatology

## 2024-05-06 DIAGNOSIS — Z85828 Personal history of other malignant neoplasm of skin: Secondary | ICD-10-CM

## 2024-05-06 DIAGNOSIS — Z86018 Personal history of other benign neoplasm: Secondary | ICD-10-CM

## 2024-05-06 DIAGNOSIS — L578 Other skin changes due to chronic exposure to nonionizing radiation: Secondary | ICD-10-CM

## 2024-05-06 DIAGNOSIS — L814 Other melanin hyperpigmentation: Secondary | ICD-10-CM

## 2024-05-06 DIAGNOSIS — L853 Xerosis cutis: Secondary | ICD-10-CM

## 2024-05-06 DIAGNOSIS — Z1283 Encounter for screening for malignant neoplasm of skin: Secondary | ICD-10-CM

## 2024-05-06 DIAGNOSIS — D229 Melanocytic nevi, unspecified: Secondary | ICD-10-CM

## 2024-05-06 NOTE — Patient Instructions (Signed)

## 2024-05-06 NOTE — Progress Notes (Unsigned)
° °  Follow-Up Visit   Subjective  Danielle Sawyer is a 43 y.o. female who presents for the following: Skin Cancer Screening and Full Body Skin Exam  The patient presents for Total-Body Skin Exam (TBSE) for skin cancer screening and mole check. The patient has spots, moles and lesions to be evaluated, some may be new or changing and the patient may have concern these could be cancer.  The following portions of the chart were reviewed this encounter and updated as appropriate: medications, allergies, medical history  Review of Systems:  No other skin or systemic complaints except as noted in HPI or Assessment and Plan.  Objective  Well appearing patient in no apparent distress; mood and affect are within normal limits.  A full examination was performed including scalp, head, eyes, ears, nose, lips, neck, chest, axillae, abdomen, back, buttocks, bilateral upper extremities, bilateral lower extremities, hands, feet, fingers, toes, fingernails, and toenails. All findings within normal limits unless otherwise noted below.   Relevant physical exam findings are noted in the Assessment and Plan.    Assessment & Plan   SKIN CANCER SCREENING PERFORMED TODAY.  ACTINIC DAMAGE - Chronic condition, secondary to cumulative UV/sun exposure - diffuse scaly erythematous macules with underlying dyspigmentation - Recommend daily broad spectrum sunscreen SPF 30+ to sun-exposed areas, reapply every 2 hours as needed.  - Staying in the shade or wearing long sleeves, sun glasses (UVA+UVB protection) and wide brim hats (4-inch brim around the entire circumference of the hat) are also recommended for sun protection.  - Call for new or changing lesions.  LENTIGINES, SEBORRHEIC KERATOSES, HEMANGIOMAS - Benign normal skin lesions - Benign-appearing - Call for any changes  MELANOCYTIC NEVI - Tan-brown and/or pink-flesh-colored symmetric macules and papules - Benign appearing on exam today - Observation -  Call clinic for new or changing moles - Recommend daily use of broad spectrum spf 30+ sunscreen to sun-exposed areas.   HISTORY OF BASAL CELL CARCINOMA OF THE SKIN - left sup chest parasternal 3.0 cm inf to med clavicle. Superficial and nodualr patterns.  - No evidence of recurrence today - Recommend regular full body skin exams - Recommend daily broad spectrum sunscreen SPF 30+ to sun-exposed areas, reapply every 2 hours as needed.  - Call if any new or changing lesions are noted between office visits  HISTORY OF DYSPLASTIC NEVI - numerous see problem list No evidence of recurrence today Recommend regular full body skin exams Recommend daily broad spectrum sunscreen SPF 30+ to sun-exposed areas, reapply every 2 hours as needed.  Call if any new or changing lesions are noted between office visits  Xerosis - diffuse xerotic patches - recommend gentle, hydrating skin care - gentle skin care handout given  Return for TBSE in 6-8 mths - hx BCC, dysplastic nevi.  LILLETTE Rosina Mayans, CMA, am acting as scribe for Alm Rhyme, MD .   Documentation: I have reviewed the above documentation for accuracy and completeness, and I agree with the above.  Alm Rhyme, MD

## 2024-05-07 ENCOUNTER — Encounter: Payer: Self-pay | Admitting: Dermatology

## 2024-12-02 ENCOUNTER — Ambulatory Visit: Admitting: Dermatology
